# Patient Record
Sex: Male | Born: 1945 | Race: Black or African American | Hispanic: No | Marital: Married | Smoking: Never smoker
Health system: Southern US, Community
[De-identification: ages and names within clinical notes are randomized; demographics above are authoritative.]

## PROBLEM LIST (undated history)

## (undated) DIAGNOSIS — I370 Nonrheumatic pulmonary valve stenosis: Secondary | ICD-10-CM

## (undated) DIAGNOSIS — C9110 Chronic lymphocytic leukemia of B-cell type not having achieved remission: Secondary | ICD-10-CM

## (undated) DIAGNOSIS — G4730 Sleep apnea, unspecified: Secondary | ICD-10-CM

## (undated) DIAGNOSIS — M1990 Unspecified osteoarthritis, unspecified site: Secondary | ICD-10-CM

## (undated) DIAGNOSIS — N529 Male erectile dysfunction, unspecified: Secondary | ICD-10-CM

## (undated) DIAGNOSIS — J301 Allergic rhinitis due to pollen: Secondary | ICD-10-CM

## (undated) DIAGNOSIS — R55 Syncope and collapse: Secondary | ICD-10-CM

## (undated) DIAGNOSIS — Z5189 Encounter for other specified aftercare: Secondary | ICD-10-CM

## (undated) DIAGNOSIS — Z9889 Other specified postprocedural states: Secondary | ICD-10-CM

## (undated) DIAGNOSIS — E669 Obesity, unspecified: Secondary | ICD-10-CM

## (undated) DIAGNOSIS — I10 Essential (primary) hypertension: Secondary | ICD-10-CM

## (undated) DIAGNOSIS — T884XXA Failed or difficult intubation, initial encounter: Secondary | ICD-10-CM

## (undated) DIAGNOSIS — H269 Unspecified cataract: Secondary | ICD-10-CM

## (undated) DIAGNOSIS — C61 Malignant neoplasm of prostate: Secondary | ICD-10-CM

## (undated) DIAGNOSIS — F419 Anxiety disorder, unspecified: Secondary | ICD-10-CM

## (undated) DIAGNOSIS — M5030 Other cervical disc degeneration, unspecified cervical region: Secondary | ICD-10-CM

## (undated) DIAGNOSIS — T7840XA Allergy, unspecified, initial encounter: Secondary | ICD-10-CM

## (undated) DIAGNOSIS — J310 Chronic rhinitis: Secondary | ICD-10-CM

## (undated) DIAGNOSIS — R011 Cardiac murmur, unspecified: Secondary | ICD-10-CM

## (undated) DIAGNOSIS — K219 Gastro-esophageal reflux disease without esophagitis: Secondary | ICD-10-CM

## (undated) DIAGNOSIS — G4733 Obstructive sleep apnea (adult) (pediatric): Secondary | ICD-10-CM

## (undated) DIAGNOSIS — I00 Rheumatic fever without heart involvement: Secondary | ICD-10-CM

## (undated) DIAGNOSIS — IMO0001 Reserved for inherently not codable concepts without codable children: Secondary | ICD-10-CM

## (undated) DIAGNOSIS — Z531 Procedure and treatment not carried out because of patient's decision for reasons of belief and group pressure: Secondary | ICD-10-CM

## (undated) HISTORY — DX: Unspecified osteoarthritis, unspecified site: M19.90

## (undated) HISTORY — PX: PROSTATE BIOPSY: SHX241

## (undated) HISTORY — DX: Cardiac murmur, unspecified: R01.1

## (undated) HISTORY — DX: Obstructive sleep apnea (adult) (pediatric): G47.33

## (undated) HISTORY — DX: Encounter for other specified aftercare: Z51.89

## (undated) HISTORY — DX: Nonrheumatic pulmonary valve stenosis: I37.0

## (undated) HISTORY — DX: Obesity, unspecified: E66.9

## (undated) HISTORY — DX: Other cervical disc degeneration, unspecified cervical region: M50.30

## (undated) HISTORY — DX: Allergic rhinitis due to pollen: J30.1

## (undated) HISTORY — DX: Gastro-esophageal reflux disease without esophagitis: K21.9

## (undated) HISTORY — DX: Unspecified cataract: H26.9

## (undated) HISTORY — PX: COLONOSCOPY: SHX174

## (undated) HISTORY — DX: Rheumatic fever without heart involvement: I00

## (undated) HISTORY — DX: Other specified postprocedural states: Z98.890

## (undated) HISTORY — DX: Essential (primary) hypertension: I10

## (undated) HISTORY — DX: Chronic rhinitis: J31.0

## (undated) HISTORY — DX: Syncope and collapse: R55

## (undated) HISTORY — PX: ROTATOR CUFF REPAIR: SHX139

## (undated) HISTORY — DX: Male erectile dysfunction, unspecified: N52.9

## (undated) HISTORY — DX: Reserved for inherently not codable concepts without codable children: IMO0001

## (undated) HISTORY — DX: Procedure and treatment not carried out because of patient's decision for reasons of belief and group pressure: Z53.1

## (undated) HISTORY — DX: Allergy, unspecified, initial encounter: T78.40XA

---

## 1999-02-23 ENCOUNTER — Encounter: Admission: RE | Admit: 1999-02-23 | Discharge: 1999-02-23 | Payer: Self-pay | Admitting: Emergency Medicine

## 1999-04-23 ENCOUNTER — Ambulatory Visit: Admission: RE | Admit: 1999-04-23 | Discharge: 1999-04-23 | Payer: Self-pay | Admitting: Emergency Medicine

## 2000-06-09 ENCOUNTER — Ambulatory Visit (HOSPITAL_BASED_OUTPATIENT_CLINIC_OR_DEPARTMENT_OTHER): Admission: RE | Admit: 2000-06-09 | Discharge: 2000-06-09 | Payer: Self-pay | Admitting: Pulmonary Disease

## 2000-06-20 ENCOUNTER — Encounter: Admission: RE | Admit: 2000-06-20 | Discharge: 2000-06-20 | Payer: Self-pay | Admitting: Emergency Medicine

## 2000-06-20 ENCOUNTER — Encounter: Payer: Self-pay | Admitting: Emergency Medicine

## 2001-02-28 ENCOUNTER — Encounter: Payer: Self-pay | Admitting: Emergency Medicine

## 2001-02-28 ENCOUNTER — Encounter: Admission: RE | Admit: 2001-02-28 | Discharge: 2001-02-28 | Payer: Self-pay | Admitting: Emergency Medicine

## 2004-02-13 ENCOUNTER — Encounter (INDEPENDENT_AMBULATORY_CARE_PROVIDER_SITE_OTHER): Payer: Self-pay | Admitting: Specialist

## 2004-02-13 ENCOUNTER — Encounter (INDEPENDENT_AMBULATORY_CARE_PROVIDER_SITE_OTHER): Payer: Self-pay | Admitting: *Deleted

## 2004-02-13 ENCOUNTER — Ambulatory Visit (HOSPITAL_COMMUNITY): Admission: RE | Admit: 2004-02-13 | Discharge: 2004-02-13 | Payer: Self-pay | Admitting: Gastroenterology

## 2004-05-19 ENCOUNTER — Ambulatory Visit: Payer: Self-pay | Admitting: Internal Medicine

## 2004-05-24 ENCOUNTER — Ambulatory Visit: Payer: Self-pay | Admitting: Internal Medicine

## 2004-06-01 ENCOUNTER — Ambulatory Visit: Payer: Self-pay | Admitting: Internal Medicine

## 2004-06-07 ENCOUNTER — Ambulatory Visit: Payer: Self-pay | Admitting: Internal Medicine

## 2004-06-14 ENCOUNTER — Ambulatory Visit: Payer: Self-pay | Admitting: Internal Medicine

## 2004-06-15 ENCOUNTER — Ambulatory Visit: Payer: Self-pay | Admitting: Internal Medicine

## 2004-06-21 ENCOUNTER — Ambulatory Visit: Payer: Self-pay | Admitting: Internal Medicine

## 2004-06-28 ENCOUNTER — Ambulatory Visit: Payer: Self-pay | Admitting: Internal Medicine

## 2004-07-05 ENCOUNTER — Ambulatory Visit: Payer: Self-pay | Admitting: Internal Medicine

## 2004-07-12 ENCOUNTER — Ambulatory Visit: Payer: Self-pay | Admitting: Internal Medicine

## 2004-07-19 ENCOUNTER — Ambulatory Visit: Payer: Self-pay | Admitting: Internal Medicine

## 2004-07-26 ENCOUNTER — Ambulatory Visit: Payer: Self-pay | Admitting: Internal Medicine

## 2004-08-03 ENCOUNTER — Ambulatory Visit: Payer: Self-pay | Admitting: Internal Medicine

## 2004-08-09 ENCOUNTER — Ambulatory Visit: Payer: Self-pay | Admitting: Internal Medicine

## 2004-08-16 ENCOUNTER — Ambulatory Visit: Payer: Self-pay | Admitting: Internal Medicine

## 2004-08-23 ENCOUNTER — Ambulatory Visit: Payer: Self-pay | Admitting: Internal Medicine

## 2008-04-14 ENCOUNTER — Encounter: Payer: Self-pay | Admitting: Gastroenterology

## 2008-04-21 DIAGNOSIS — G4733 Obstructive sleep apnea (adult) (pediatric): Secondary | ICD-10-CM

## 2008-04-21 DIAGNOSIS — E669 Obesity, unspecified: Secondary | ICD-10-CM | POA: Insufficient documentation

## 2008-04-21 DIAGNOSIS — J310 Chronic rhinitis: Secondary | ICD-10-CM

## 2008-04-21 DIAGNOSIS — I10 Essential (primary) hypertension: Secondary | ICD-10-CM

## 2008-04-21 DIAGNOSIS — R011 Cardiac murmur, unspecified: Secondary | ICD-10-CM

## 2008-04-22 ENCOUNTER — Ambulatory Visit: Payer: Self-pay | Admitting: Gastroenterology

## 2008-04-30 ENCOUNTER — Ambulatory Visit: Payer: Self-pay | Admitting: Gastroenterology

## 2008-04-30 ENCOUNTER — Encounter: Payer: Self-pay | Admitting: Gastroenterology

## 2008-05-01 ENCOUNTER — Encounter: Payer: Self-pay | Admitting: Gastroenterology

## 2008-05-06 ENCOUNTER — Ambulatory Visit: Payer: Self-pay | Admitting: Cardiovascular Disease

## 2008-05-06 ENCOUNTER — Telehealth: Payer: Self-pay | Admitting: Gastroenterology

## 2009-11-17 ENCOUNTER — Emergency Department (HOSPITAL_COMMUNITY): Admission: EM | Admit: 2009-11-17 | Discharge: 2009-11-17 | Payer: Self-pay | Admitting: Emergency Medicine

## 2010-06-06 ENCOUNTER — Encounter: Payer: Self-pay | Admitting: Emergency Medicine

## 2010-10-01 NOTE — Op Note (Signed)
Eugene [name], Eugene [name]               ACCOUNT NO.:  [id number]   MEDICAL RECORD NO.:  [id number]          PATIENT TYPE:  AMB   LOCATION:  ENDO                         FACILITY:  [facility]   PHYSICIAN:  Graylin Shiver, M.D.   DATE OF BIRTH:  Aug 12, 1945   DATE OF PROCEDURE:  02/13/2004  DATE OF DISCHARGE:                                 OPERATIVE REPORT   PROCEDURE:  Colonoscopy with biopsy.   INDICATIONS:  Right-sided abdominal pain, etiology unclear.  Screening.   Informed consent was obtained after explanation of the risks of bleeding,  infection, and perforation.   PREMEDICATIONS:  Fentanyl 125 mcg IV, Versed 12 mg IV.   PROCEDURE:  With the patient in the left lateral decubitus position, a  rectal exam was performed.  No masses were felt.  The Olympus colonoscope  was inserted into the rectum and advanced around the colon to the cecum.  Cecal landmarks were identified.  The cecum and ascending colon looked  normal.  The descending colon and sigmoid revealed diverticulosis.  Also in  the descending colon, there was a small area on a fold that looked like some  slight thickening on the fold, and a biopsy was obtained.  I do not think  this will be of any clinical significance.  Biopsy will be checked.  The  rectum looked normal.  He tolerated the procedure well without  complications.   IMPRESSION:  Diverticulosis.   I see nothing on this examination to explain the patient's right-sided  abdominal pain.   IMPRESSION:  Normal colonoscopy to the cecum.   PLAN:  I would recommend a follow-up screening colonoscopy again in 10  years.      SFG/MEDQ  D:  02/13/2004  T:  02/13/2004  Job:  [id number]   cc:   Reuben Likes, M.D.  [address]  [place]  [place] [address]  Fax: [phone]

## 2011-03-01 ENCOUNTER — Encounter: Payer: Self-pay | Admitting: Family Medicine

## 2011-03-01 ENCOUNTER — Ambulatory Visit (INDEPENDENT_AMBULATORY_CARE_PROVIDER_SITE_OTHER): Payer: 59 | Admitting: Family Medicine

## 2011-03-01 DIAGNOSIS — J301 Allergic rhinitis due to pollen: Secondary | ICD-10-CM

## 2011-03-01 DIAGNOSIS — F411 Generalized anxiety disorder: Secondary | ICD-10-CM

## 2011-03-01 DIAGNOSIS — G4733 Obstructive sleep apnea (adult) (pediatric): Secondary | ICD-10-CM

## 2011-03-01 DIAGNOSIS — M1990 Unspecified osteoarthritis, unspecified site: Secondary | ICD-10-CM

## 2011-03-01 DIAGNOSIS — N529 Male erectile dysfunction, unspecified: Secondary | ICD-10-CM

## 2011-03-01 DIAGNOSIS — J302 Other seasonal allergic rhinitis: Secondary | ICD-10-CM | POA: Insufficient documentation

## 2011-03-01 DIAGNOSIS — I10 Essential (primary) hypertension: Secondary | ICD-10-CM

## 2011-03-01 MED ORDER — AMLODIPINE BESYLATE 10 MG PO TABS: 10.0000 mg | ORAL_TABLET | Freq: Every day | ORAL | Status: DC

## 2011-03-01 MED ORDER — LOSARTAN POTASSIUM 50 MG PO TABS
50.0000 mg | ORAL_TABLET | Freq: Two times a day (BID) | ORAL | Status: DC
Start: 2011-03-01 — End: 2011-08-30

## 2011-03-01 MED ORDER — FLUTICASONE PROPIONATE 50 MCG/ACT NA SUSP: 2.0000 | Freq: Two times a day (BID) | NASAL | Status: DC

## 2011-03-01 MED ORDER — CARVEDILOL 6.25 MG PO TABS: 6.2500 mg | ORAL_TABLET | Freq: Two times a day (BID) | ORAL | Status: DC

## 2011-03-01 MED ORDER — AZELASTINE HCL 0.1 % NA SOLN: 1.0000 | Freq: Two times a day (BID) | NASAL | Status: DC

## 2011-03-01 MED ORDER — VIAGRA 100 MG PO TABS: 100.0000 mg | ORAL_TABLET | ORAL | Status: DC | PRN

## 2011-03-01 MED ORDER — CLONAZEPAM 1 MG PO TABS: 1.0000 mg | ORAL_TABLET | Freq: Every day | ORAL | Status: DC

## 2011-03-01 NOTE — Progress Notes (Signed)
  Subjective:    Patient ID: Eugene [name], male    DOB: [birth year], 65 y.o.   MRN: [id number]  HPIHoward is a 65 year old, married male, nonsmoker, who comes in today as a new patient.  He went to see Dr. Susann [name] after it his insurance would no longer covered Dr. Renae [name], and they refused to see him.  Because he has Medicare.  He has a history of underlying hypertension, for which he takes losartan 50 mg b.i.d., Norvasc, 10 mg daily, and carvedilol 6.25 mg b.i.d.Marland Kitchen  Blood pressure today 150/90.  He, states that's normal for him.  I explained that this is not normal.  Blood pressure.  He also has allergic rhinitis refuses steroid nasal spray, and an over-the-counter antihistamine, and some Astelin nasal spray.  He also has mild anxiety, for which he takes clonazepam 1 mg p.r.n.  He also has erectile dysfunction for which he uses Viagra 100 mg p.r.n.  Illnesses osteoarthritis, for which he takes nabumetone 750 mg b.i.d., and volt gel  Less physical exam by Dr. Renae [name] in January 2012 normal.  He does not recall when his last vaccination for the past year.  He copy of all his medical records.  He also complains of fatigue.  He states he had a sleep apnea evaluation by Dr. Mellody [name] in 2004 and was diagnosed with sleep apnea.  However, he stopped the device because he couldn't tolerate it.  I asked him to go back and see Dr. Mellody [name] to discuss the new options.    Review of Systems    General cardiovascular review of systems otherwise negative Objective:   Physical Exam  Well-developed well-nourished, male in no acute distress, slightly overweight, 257 pounds.  BP right arm sitting position 140/90.        Assessment & Plan:  Hypertension not at goal.  Plan daily blood pressure checks.  Follow-up in 3 weeks.  Allergic rhinitis.  Continue the steroid nasal spray Astelin nasal spray, and over-the-counter Claritin plain.  Osteoarthritis.  Motrin, 600 mg b.i.d.  History of sleep apnea.  We  consult with Dr. Mellody [name].  Erectile dysfunction continue Viagra.  Return in 3 weeks for follow-up of your blood pressure

## 2011-03-01 NOTE — Patient Instructions (Signed)
f y  blood pressure continue the losartan 50 mg twice daily, carvedilol 6.25 mg twice daily, and the Norvasc, 10 mg once daily........ Check your blood pressure daily in the morning and return in 3 weeks with the data and the device  Stop the gel, and nabutone ,,,,,,,,,,, Motrin 600 mg twice daily with food.  Vicryl 100 mg p.r.n.  Call Dr. Mellody Dance to reconsult because of the sleep apnea.

## 2011-03-22 ENCOUNTER — Encounter: Payer: Self-pay | Admitting: Family Medicine

## 2011-03-22 ENCOUNTER — Ambulatory Visit (INDEPENDENT_AMBULATORY_CARE_PROVIDER_SITE_OTHER): Payer: 59 | Admitting: Family Medicine

## 2011-03-22 DIAGNOSIS — I10 Essential (primary) hypertension: Secondary | ICD-10-CM

## 2011-03-22 DIAGNOSIS — J301 Allergic rhinitis due to pollen: Secondary | ICD-10-CM

## 2011-03-22 DIAGNOSIS — N529 Male erectile dysfunction, unspecified: Secondary | ICD-10-CM | POA: Insufficient documentation

## 2011-03-22 LAB — CBC WITH DIFFERENTIAL/PLATELET
Basophils Absolute: 0 10*3/uL (ref 0.0–0.1)
Basophils Relative: 0.6 % (ref 0.0–3.0)
Eosinophils Absolute: 0.2 10*3/uL (ref 0.0–0.7)
MCHC: 33.1 g/dL (ref 30.0–36.0)
MCV: 101.8 fl — ABNORMAL HIGH (ref 78.0–100.0)
Monocytes Absolute: 0.4 10*3/uL (ref 0.1–1.0)
Neutro Abs: 3.3 10*3/uL (ref 1.4–7.7)
Neutrophils Relative %: 55.2 % (ref 43.0–77.0)
RBC: 4.16 Mil/uL — ABNORMAL LOW (ref 4.22–5.81)
RDW: 12.6 % (ref 11.5–14.6)

## 2011-03-22 LAB — BASIC METABOLIC PANEL
BUN: 14 mg/dL (ref 6–23)
CO2: 31 mEq/L (ref 19–32)
Calcium: 9.5 mg/dL (ref 8.4–10.5)
Glucose, Bld: 77 mg/dL (ref 70–99)
Potassium: 4.5 mEq/L (ref 3.5–5.1)
Sodium: 141 mEq/L (ref 135–145)

## 2011-03-22 NOTE — Patient Instructions (Signed)
Continue your current blood pressure medication.  Check your blood pressure weekly, right arm in the morning.  We will call you when we get her lab work back.  Call Dr. Dillard Cannon ................ nose and throat specialist for consultation on the right-sided facial pain

## 2011-03-22 NOTE — Progress Notes (Signed)
  Subjective:    Patient ID: Eugene [name], male    DOB: [birth year], 65 y.o.   MRN: [id number]  HPI Eugene [name] is a 65 year old male, nonsmoker, who comes in today for evaluation.  Three problems.  He is currently on Norvasc 10 mg daily, carvedilol 6.25 mg b.i.d., to start an 50 mg b.i.d., BP stable.  130/80 on average.  He states he has allergic rhinitis and now has chronic sinus pain and he points to his right frontal and maxillary sinuses is chronic pain.  He tried over-the-counter medications, nasal sprays to no avail.  He states the right side of his face.  His been stopped up for 6 months.  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  He took one month of medication and then took no more because the physician that he saw who prescribed the medication did not renew it.???????????  His symptoms are fatigue, no energy, and ED for which he takes Viagra 100 mg p.r.n.     Review of Systems    General cardiovascular, ENT, and sexual review of systems otherwise negative Objective:   Physical Exam  Well-developed well-nourished man no acute distress.  BP ram sitting position 130/90      Assessment & Plan:  Hypertension adult continue current therapy.  BP checks weekly at home.  History of hypo-gon........Marland Kitchen Reevaluate with testosterone level, and prolactin level.  Chronic allergic rhinitis, now with right maxillary and frontal pain, x 6 months referred to ENT.  Dr. Ezzard Standing

## 2011-03-23 ENCOUNTER — Telehealth: Payer: Self-pay | Admitting: Family Medicine

## 2011-03-23 LAB — PSA, TOTAL AND FREE: PSA: 1.52 ng/mL (ref <=4.00)

## 2011-03-23 LAB — PROLACTIN: Prolactin: 5.5 ng/mL (ref 2.1–17.1)

## 2011-03-23 NOTE — Telephone Encounter (Signed)
Fleet Contras [facility] denied the nasal spray on this pt. You said he wouldn't mind switching if denied, so this is just  FYI. Thanks!

## 2011-03-24 MED ORDER — MOMETASONE FUROATE 50 MCG/ACT NA SUSP: 2.0000 | Freq: Every day | NASAL | Status: DC

## 2011-03-24 NOTE — Telephone Encounter (Signed)
New rx sent

## 2011-03-28 ENCOUNTER — Ambulatory Visit (INDEPENDENT_AMBULATORY_CARE_PROVIDER_SITE_OTHER): Payer: 59 | Admitting: Family Medicine

## 2011-03-28 ENCOUNTER — Encounter: Payer: Self-pay | Admitting: Family Medicine

## 2011-03-28 DIAGNOSIS — N529 Male erectile dysfunction, unspecified: Secondary | ICD-10-CM

## 2011-03-28 NOTE — Patient Instructions (Signed)
Take the Viagra 100 mg........... One half to one tablet as needed.  Consider the pluses and minuses of supplementation and let us know which way he like to go

## 2011-03-28 NOTE — Progress Notes (Signed)
  Subjective:    Patient ID: Eugene [name], male    DOB: 05/10/1946, 65 y.o.   MRN: [id number]  HPI [name] is a 65 year old male, who comes in today for evaluation of low testosterone.  As previously noted he is seeing another physician and was diagnosed with low testosterone supplements for two months and then stopped.  He does have symptoms of erectile dysfunction.  Otherwise asymptomatic.  Lab shows a normal prolactin level and a testosterone level of 331.  Low normal being 350.  We discussed all the pluses and minuses of supplementation, including the increased risk of cancer, with costosternal supple.  Medications.  I advised him to go home and think about it read about it and let us know what he would like to do in the meantime.  A prescription will be given for Viagra   Review of Systems    In general, and genitourinary review of systems otherwise negative Objective:   Physical Exam Well-developed and nourished, male in no acute distress       Assessment & Plan:  Load testosterone plan the Viagra supplementation.  Patient to consider testosterone supplements, including all the potential complications

## 2011-03-30 ENCOUNTER — Other Ambulatory Visit: Payer: Self-pay | Admitting: Otolaryngology

## 2011-03-30 DIAGNOSIS — J329 Chronic sinusitis, unspecified: Secondary | ICD-10-CM

## 2011-04-01 ENCOUNTER — Ambulatory Visit
Admission: RE | Admit: 2011-04-01 | Discharge: 2011-04-01 | Disposition: A | Discharge status: Home / Self Care | Payer: 59 | Source: Ambulatory Visit | Attending: Otolaryngology | Admitting: Otolaryngology

## 2011-04-01 DIAGNOSIS — J329 Chronic sinusitis, unspecified: Secondary | ICD-10-CM

## 2011-04-05 ENCOUNTER — Other Ambulatory Visit: Payer: Self-pay | Admitting: *Deleted

## 2011-04-05 DIAGNOSIS — N529 Male erectile dysfunction, unspecified: Secondary | ICD-10-CM

## 2011-04-05 MED ORDER — VIAGRA 100 MG PO TABS: 100.0000 mg | ORAL_TABLET | ORAL | Status: DC | PRN

## 2011-04-06 ENCOUNTER — Other Ambulatory Visit: Payer: Self-pay | Admitting: *Deleted

## 2011-04-06 DIAGNOSIS — J301 Allergic rhinitis due to pollen: Secondary | ICD-10-CM

## 2011-04-06 MED ORDER — FLUTICASONE PROPIONATE 50 MCG/ACT NA SUSP: 2.0000 | Freq: Two times a day (BID) | NASAL | Status: DC

## 2011-04-06 NOTE — Telephone Encounter (Signed)
Patient would like the rx for Flonase resent with 3 bottles

## 2011-08-02 ENCOUNTER — Ambulatory Visit (HOSPITAL_COMMUNITY)
Admission: RE | Admit: 2011-08-02 | Discharge: 2011-08-02 | Disposition: A | Discharge status: Home / Self Care | Payer: Medicare PPO | Source: Ambulatory Visit | Attending: Cardiology | Admitting: Cardiology

## 2011-08-02 ENCOUNTER — Encounter (HOSPITAL_COMMUNITY): Payer: Self-pay | Admitting: *Deleted

## 2011-08-02 ENCOUNTER — Encounter (HOSPITAL_COMMUNITY)
Admission: RE | Disposition: A | Discharge status: Home / Self Care | Payer: Self-pay | Source: Ambulatory Visit | Attending: Cardiology

## 2011-08-02 DIAGNOSIS — Q211 Atrial septal defect: Secondary | ICD-10-CM | POA: Insufficient documentation

## 2011-08-02 DIAGNOSIS — I379 Nonrheumatic pulmonary valve disorder, unspecified: Secondary | ICD-10-CM | POA: Insufficient documentation

## 2011-08-02 DIAGNOSIS — Q2111 Secundum atrial septal defect: Secondary | ICD-10-CM | POA: Insufficient documentation

## 2011-08-02 HISTORY — DX: Sleep apnea, unspecified: G47.30

## 2011-08-02 HISTORY — PX: TEE WITHOUT CARDIOVERSION: SHX5443

## 2011-08-02 HISTORY — DX: Anxiety disorder, unspecified: F41.9

## 2011-08-02 SURGERY — ECHOCARDIOGRAM, TRANSESOPHAGEAL
Anesthesia: Moderate Sedation

## 2011-08-02 MED ORDER — DIPHENHYDRAMINE HCL 50 MG/ML IJ SOLN
INTRAMUSCULAR | Status: AC
  Filled 2011-08-02: qty 1

## 2011-08-02 MED ORDER — MIDAZOLAM HCL 10 MG/2ML IJ SOLN
INTRAMUSCULAR | Status: AC
  Filled 2011-08-02: qty 4

## 2011-08-02 MED ORDER — FENTANYL CITRATE 0.05 MG/ML IJ SOLN
INTRAMUSCULAR | Status: DC | PRN
  Administered 2011-08-02 (×3): 50 ug via INTRAVENOUS

## 2011-08-02 MED ORDER — LABETALOL HCL 5 MG/ML IV SOLN
INTRAVENOUS | Status: DC | PRN
  Administered 2011-08-02: 20 mg via INTRAVENOUS

## 2011-08-02 MED ORDER — SODIUM CHLORIDE 0.45 % IV SOLN
Freq: Once | INTRAVENOUS | Status: AC
  Administered 2011-08-02: 11:00:00 via INTRAVENOUS

## 2011-08-02 MED ORDER — LABETALOL HCL 5 MG/ML IV SOLN
INTRAVENOUS | Status: AC
  Filled 2011-08-02: qty 4

## 2011-08-02 MED ORDER — FENTANYL CITRATE 0.05 MG/ML IJ SOLN
INTRAMUSCULAR | Status: AC
  Filled 2011-08-02: qty 4

## 2011-08-02 MED ORDER — BUTAMBEN-TETRACAINE-BENZOCAINE 2-2-14 % EX AERO
INHALATION_SPRAY | CUTANEOUS | Status: DC | PRN
  Administered 2011-08-02: 2 via TOPICAL

## 2011-08-02 MED ORDER — MIDAZOLAM HCL 10 MG/2ML IJ SOLN
INTRAMUSCULAR | Status: DC | PRN
  Administered 2011-08-02: 3 mg via INTRAVENOUS
  Administered 2011-08-02: 1 mg via INTRAVENOUS
  Administered 2011-08-02: 2 mg via INTRAVENOUS

## 2011-08-02 NOTE — CV Procedure (Signed)
TEE performed. Intubation helped by Dr. Nadine Counts Bucchini (Thank you)  Small PFO with mildly positive right to left shunting with Valsalva.  Mild pulmonary valve stenosis. Mild MR. Please see PACS for full report.

## 2011-08-02 NOTE — H&P (Addendum)
  Please see paper chart. Patient examined and medical history reviewed. No change from 07/30/11 OV note

## 2011-08-03 ENCOUNTER — Encounter (HOSPITAL_COMMUNITY): Payer: Self-pay | Admitting: Cardiology

## 2011-08-17 ENCOUNTER — Encounter: Payer: Self-pay | Admitting: Pulmonary Disease

## 2011-08-17 ENCOUNTER — Ambulatory Visit (INDEPENDENT_AMBULATORY_CARE_PROVIDER_SITE_OTHER): Payer: 59 | Admitting: Pulmonary Disease

## 2011-08-17 VITALS — BP 152/78 | HR 96 | Temp 98.7°F | Ht 69.5 in | Wt 254.2 lb

## 2011-08-17 DIAGNOSIS — G4733 Obstructive sleep apnea (adult) (pediatric): Secondary | ICD-10-CM

## 2011-08-17 NOTE — Progress Notes (Signed)
  Subjective:    Patient ID: Eugene [name], male    DOB: April 04, 1946, 66 y.o.   MRN: [id number]  HPI The patient is a 66 year old male who I've been asked to see for obstructive sleep apnea.  He was diagnosed with.  Severe sleep apnea in 2000, with an AHI of 76 events and per hour.  He was started on CPAP, and titrated to a therapeutic pressure of 11 cm.  He used this until 2005, when he discontinued because of a feeling of "suffocation".  He has not used CPAP since.  The patient continues to have loud snoring, but states that his wife has not commented lately about an abnormal breathing pattern during sleep.  He notes frequent awakenings at night, and is unrested at least 50% the mornings upon arising.  The patient denies sleepiness during the day except after lunch, when he notes significant symptoms with inactivity.  He denies sleepiness issues in the evening watching television or movies, and denies sleepiness while driving.  He states his weight is down 12 pounds over the last 2 years, and his Epworth score today is 8  10:00 to 10:30 How long does it take you to fall asleep? 30 to 45 mins How many times during the night do you wake up? 4 What time do you get out of bed to start your day? 0630 Do you drive or operate heavy machinery in your occupation? No How much has your weight changed (up or down) over the past two years? (In pounds) 12 lb (5.443 kg) Have you ever had a sleep study before? Yes If yes, location of study? [facility] If yes, date of study? 2002 Do you currently use CPAP? No Do you wear oxygen at any time? No    Review of Systems  Constitutional: Negative for fever and unexpected weight change.  HENT: Positive for congestion. Negative for ear pain, nosebleeds, sore throat, rhinorrhea, sneezing, trouble swallowing, dental problem, postnasal drip and sinus pressure.   Eyes: Negative for redness and itching.  Respiratory: Negative for cough, chest tightness, shortness of breath  and wheezing.   Cardiovascular: Negative for palpitations and leg swelling.  Gastrointestinal: Negative for nausea and vomiting.  Genitourinary: Negative for dysuria.  Musculoskeletal: Positive for joint swelling.  Skin: Negative for rash.  Neurological: Negative for headaches.  Hematological: Does not bruise/bleed easily.  Psychiatric/Behavioral: Negative for dysphoric mood. The patient is not nervous/anxious.        Objective:   Physical Exam Constitutional:  Obese male, no acute distress  HENT:  Nares patent without discharge, but enlarged turbinates  Oropharynx without exudate, palate and uvula are mildly elongated  Eyes:  Perrla, eomi, no scleral icterus  Neck:  No JVD, no TMG  Cardiovascular:  Normal rate, regular rhythm, no rubs or gallops.  3/6 rumbling murmur        Intact distal pulses  Pulmonary :  Normal breath sounds, no stridor or respiratory distress   No rales, rhonchi, or wheezing  Abdominal:  Soft, nondistended, bowel sounds present.  No tenderness noted.   Musculoskeletal:  + lower extremity edema noted.  Lymph Nodes:  No cervical lymphadenopathy noted  Skin:  No cyanosis noted  Neurologic:  Alert, appropriate, moves all 4 extremities without obvious deficit.         Assessment & Plan:

## 2011-08-17 NOTE — Patient Instructions (Signed)
Will start you on cpap with pressure that is self adjusting to your degree of sleep apnea throughout the night.  Please call us if having issues with tolerance. Work on weight loss If doing well, would like to see you back in 5 weeks.

## 2011-08-17 NOTE — Assessment & Plan Note (Signed)
The patient has a history of severe obstructive sleep apnea, but has not used CPAP since 2005.  He has not lost significant weight, and therefore still has significant sleep apnea.  He still has loud snoring, frequent awakenings at night, and nonrestorative sleep.  I am also concerned about its impact to his long-term cardiovascular health.  I have had a long discussion with him about the treatment for her severe sleep apnea, and feel that CPAP and weight loss are his best options.  I have reviewed the various new CPAP devices, including the different ways that pressure can be delivered.  The patient is willing to try CPAP again, and will start him out on the automatic mode.  I have also stressed to him again the importance of aggressive weight loss.

## 2011-08-25 ENCOUNTER — Telehealth: Payer: Self-pay | Admitting: Pulmonary Disease

## 2011-08-25 NOTE — Telephone Encounter (Signed)
Will forward to [facility] as FYI 

## 2011-08-25 NOTE — Telephone Encounter (Signed)
Pt already has appt with [facility] 5.16.13 which is exactly 5 weeks from now.  Will forward back to [facility] to make him aware.

## 2011-08-25 NOTE — Telephone Encounter (Signed)
If he just got his cpap, then his f/u visit will need to be adjusted to 5 weeks from now.

## 2011-08-26 ENCOUNTER — Ambulatory Visit: Payer: 59 | Admitting: Internal Medicine

## 2011-08-30 ENCOUNTER — Encounter: Payer: Self-pay | Admitting: Internal Medicine

## 2011-08-30 ENCOUNTER — Ambulatory Visit (INDEPENDENT_AMBULATORY_CARE_PROVIDER_SITE_OTHER): Payer: 59 | Admitting: Internal Medicine

## 2011-08-30 VITALS — BP 124/78 | HR 75 | Temp 98.6°F | Ht 69.5 in | Wt 252.2 lb

## 2011-08-30 DIAGNOSIS — I379 Nonrheumatic pulmonary valve disorder, unspecified: Secondary | ICD-10-CM

## 2011-08-30 DIAGNOSIS — M1990 Unspecified osteoarthritis, unspecified site: Secondary | ICD-10-CM

## 2011-08-30 DIAGNOSIS — J301 Allergic rhinitis due to pollen: Secondary | ICD-10-CM

## 2011-08-30 DIAGNOSIS — I370 Nonrheumatic pulmonary valve stenosis: Secondary | ICD-10-CM | POA: Insufficient documentation

## 2011-08-30 DIAGNOSIS — I10 Essential (primary) hypertension: Secondary | ICD-10-CM

## 2011-08-30 MED ORDER — AZELASTINE HCL 0.1 % NA SOLN: 1.0000 | Freq: Two times a day (BID) | NASAL | Status: DC

## 2011-08-30 MED ORDER — LOSARTAN POTASSIUM 50 MG PO TABS: 50.0000 mg | ORAL_TABLET | Freq: Two times a day (BID) | ORAL | Status: DC

## 2011-08-30 MED ORDER — DICLOFENAC SODIUM 1 % TD GEL: 1.0000 "application " | Freq: Four times a day (QID) | TRANSDERMAL | Status: DC

## 2011-08-30 MED ORDER — CARVEDILOL 6.25 MG PO TABS: 6.2500 mg | ORAL_TABLET | Freq: Two times a day (BID) | ORAL | Status: DC

## 2011-08-30 MED ORDER — AMLODIPINE BESYLATE 10 MG PO TABS: 10.0000 mg | ORAL_TABLET | Freq: Every day | ORAL | Status: DC

## 2011-08-30 NOTE — Assessment & Plan Note (Signed)
chronic sinus and allergy symptoms  - reviewed CT 03/2011 Has seen ENT for same, upcoming allergist visit 09/2011 No astelin at this time due to problems with PA - will refill now Continue nasal steroid

## 2011-08-30 NOTE — Progress Notes (Signed)
Subjective:    Patient ID: Eugene [name], male    DOB: Apr 29, 1946, 66 y.o.   MRN: [id number]  HPI  New pt to me but known to our practice - transfer from [facility] office ([facility]) Here to establish care  Reviewed chronic medical issues:  allergic rhinitis - chronic but seasonally worse - has seen ENT with 03/2011 CT - planning visit with allergist next month - ?need both nose sprays - only using flonase due to PA problems with astelin  hypertension - the patient reports compliance with medication(s) as prescribed. Denies adverse side effects.  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 follows with cardiology for same every 6-12 months. No edema, shortness of breath or other symptoms. Last TEE 07/2011 stable  OSA. hx severe disease when diagnosed in 2000. On CPAP 2000- 2005, resumed auto titration CPAP March 2013 at direction of pulmonary - reports continued problems with mask and sleep  osteoarthritis - knees - also shoulders - uses NSAIDs for same - topical and oral - recent flare of R shoulder symptoms following overuse last weekend - no swelling - no limited ROM, no injury/trauma - no weakness or numbness  Past Medical History  Diagnosis Date  . Chicken pox   . Hypertension   . Rheumatic fever   . Anxiety   . Allergic rhinitis due to pollen   . ED (erectile dysfunction)   . OBESITY   . Osteoarthritis   . RHINITIS, CHRONIC   . SLEEP APNEA, OBSTRUCTIVE dx 2000    NPSG 2000:  AHI 76/hr CPAP titrated to 11cm 2002      Review of Systems  Constitutional: Negative for fever, fatigue and unexpected weight change.  Respiratory: Negative for cough and shortness of breath.   Cardiovascular: Negative for chest pain, palpitations and leg swelling.  Gastrointestinal: Negative for abdominal pain and blood in stool.       Objective:   Physical Exam BP 124/78  Pulse 75  Temp(Src) 98.6 F (37 C) (Oral)  Ht 5' 9.5" (1.765 m)  Wt 252 lb 3.2 oz  (114.397 kg)  BMI 36.71 kg/m2  SpO2 98% Wt Readings from Last 3 Encounters:  08/30/11 252 lb 3.2 oz (114.397 kg)  08/17/11 254 lb 3.2 oz (115.304 kg)  03/22/11 253 lb (114.76 kg)   Constitutional:  He appears well-developed and well-nourished. No distress.  Neck: Normal range of motion. Neck supple. No JVD present. No thyromegaly present.  Cardiovascular: Normal rate, regular rhythm and normal heart sounds.  No murmur heard. no BLE edema Pulmonary/Chest: Effort normal and breath sounds normal. No respiratory distress. no wheezes.  Abdominal: Soft. Bowel sounds are normal. Patient exhibits no distension. There is no tenderness.  Musculoskeletal: Right Shoulder: Full range of motion. Neurovascularly intact distally. Good strength with stress of rotator cuff but causes pain. Positive impingement signs. Neurological: he is alert and oriented to person, place, and time. No cranial nerve deficit. Coordination normal.  Skin: Skin is warm and dry.  No erythema or ulceration.  Psychiatric: he has a normal mood and affect. behavior is normal. Judgment and thought content normal.   Lab Results  Component Value Date   WBC 6.0 03/22/2011   HGB 14.0 03/22/2011   HCT 42.4 03/22/2011   PLT 154.0 03/22/2011   GLUCOSE 77 03/22/2011   NA 141 03/22/2011   K 4.5 03/22/2011   CL 104 03/22/2011   CREATININE 1.1 03/22/2011   BUN 14 03/22/2011   CO2 31 03/22/2011  PSA 1.38 03/22/2011   PSA 1.52 03/22/2011      Assessment & Plan:  See problem list. Medications and labs reviewed today.

## 2011-08-30 NOTE — Patient Instructions (Signed)
It was good to see you today. We have reviewed your prior records including labs and tests today Medications reviewed, no changes at this time. Refill on medication(s) as discussed today. BP medications to mail order and Voltaren gel + nose spray to local pharamacy Continue to work with your other medical specialists as ongoing Please schedule followup in 3-4 months for continued review, call sooner if problems.

## 2011-08-30 NOTE — Assessment & Plan Note (Signed)
Regular NSAIDs - B knees and hands Recent R shoulder tendonitis - Continue NSAIDs but advised to not use topical voltaren at same time as oral Aleve

## 2011-08-30 NOTE — Assessment & Plan Note (Signed)
BP Readings from Last 3 Encounters:  08/30/11 124/78  08/17/11 152/78  08/02/11 147/97   The current medical regimen is effective;  continue present plan and medications.

## 2011-09-13 ENCOUNTER — Other Ambulatory Visit: Payer: Self-pay | Admitting: *Deleted

## 2011-09-13 DIAGNOSIS — F411 Generalized anxiety disorder: Secondary | ICD-10-CM

## 2011-09-13 NOTE — Telephone Encounter (Signed)
Left msg on vm needing a renewal on clonazepam. ... 09/13/11@4 :34pm/LMB

## 2011-09-14 MED ORDER — CLONAZEPAM 1 MG PO TABS: 1.0000 mg | ORAL_TABLET | Freq: Every day | ORAL | Status: DC

## 2011-09-14 NOTE — Telephone Encounter (Signed)
Notified pt rx fax back to [facility]... 09/14/11@1 :16pm/LMB

## 2011-09-15 ENCOUNTER — Encounter: Payer: Self-pay | Admitting: Internal Medicine

## 2011-09-15 ENCOUNTER — Ambulatory Visit (INDEPENDENT_AMBULATORY_CARE_PROVIDER_SITE_OTHER): Payer: 59 | Admitting: Internal Medicine

## 2011-09-15 VITALS — BP 142/78 | HR 83 | Ht 69.5 in | Wt 254.8 lb

## 2011-09-15 DIAGNOSIS — J301 Allergic rhinitis due to pollen: Secondary | ICD-10-CM

## 2011-09-15 MED ORDER — METHYLPREDNISOLONE ACETATE 80 MG/ML IJ SUSP
80.0000 mg | Freq: Once | INTRAMUSCULAR | Status: AC
  Administered 2011-09-15: 80 mg via INTRAMUSCULAR

## 2011-09-15 MED ORDER — PHENYLEPHRINE HCL 1 % NA SOLN
1.0000 [drp] | Freq: Once | NASAL | Status: AC
  Administered 2011-09-15: 1 [drp] via NASAL

## 2011-09-15 NOTE — Patient Instructions (Signed)
Neb neo nasal  Depo 80  Sample Dymista nasal spray    2 sprays each nostril once every day at bedtime   Use this instead of the fluticasone and astelin nasal sprays you had been using.

## 2011-09-15 NOTE — Progress Notes (Deleted)
  Subjective:    Patient ID: Eugene [name], male    DOB: 01/04/1946, 65 y.o.   MRN: [id number]  HPI The patient is a 65-year-old male who I've been asked to see for obstructive sleep apnea.  He was diagnosed with.  Severe sleep apnea in 2000, with an AHI of 76 events and per hour.  He was started on CPAP, and titrated to a therapeutic pressure of 11 cm.  He used this until 2005, when he discontinued because of a feeling of "suffocation".  He has not used CPAP since.  The patient continues to have loud snoring, but states that his wife has not commented lately about an abnormal breathing pattern during sleep.  He notes frequent awakenings at night, and is unrested at least 50% the mornings upon arising.  The patient denies sleepiness during the day except after lunch, when he notes significant symptoms with inactivity.  He denies sleepiness issues in the evening watching television or movies, and denies sleepiness while driving.  He states his weight is down 12 pounds over the last 2 years, and his Epworth score today is 8  10:00 to 10:30 How long does it take you to fall asleep? 30 to 45 mins How many times during the night do you wake up? 4 What time do you get out of bed to start your day? 0630 Do you drive or operate heavy machinery in your occupation? No How much has your weight changed (up or down) over the past two years? (In pounds) 12 lb (5.443 kg) Have you ever had a sleep study before? Yes If yes, location of study? [facility] [place] If yes, date of study? 2002 Do you currently use CPAP? No Do you wear oxygen at any time? No    Review of Systems  Constitutional: Negative for fever and unexpected weight change.  HENT: Positive for congestion. Negative for ear pain, nosebleeds, sore throat, rhinorrhea, sneezing, trouble swallowing, dental problem, postnasal drip and sinus pressure.   Eyes: Negative for redness and itching.  Respiratory: Negative for cough, chest tightness, shortness of breath  and wheezing.   Cardiovascular: Negative for palpitations and leg swelling.  Gastrointestinal: Negative for nausea and vomiting.  Genitourinary: Negative for dysuria.  Musculoskeletal: Positive for joint swelling.  Skin: Negative for rash.  Neurological: Negative for headaches.  Hematological: Does not bruise/bleed easily.  Psychiatric/Behavioral: Negative for dysphoric mood. The patient is not nervous/anxious.        Objective:   Physical Exam Constitutional:  Obese male, no acute distress  HENT:  Nares patent without discharge, but enlarged turbinates  Oropharynx without exudate, palate and uvula are mildly elongated  Eyes:  Perrla, eomi, no scleral icterus  Neck:  No JVD, no TMG  Cardiovascular:  Normal rate, regular rhythm, no rubs or gallops.  3/6 rumbling murmur        Intact distal pulses  Pulmonary :  Normal breath sounds, no stridor or respiratory distress   No rales, rhonchi, or wheezing  Abdominal:  Soft, nondistended, bowel sounds present.  No tenderness noted.   Musculoskeletal:  + lower extremity edema noted.  Lymph Nodes:  No cervical lymphadenopathy noted  Skin:  No cyanosis noted  Neurologic:  Alert, appropriate, moves all 4 extremities without obvious deficit.         Assessment & Plan:   

## 2011-09-15 NOTE — Progress Notes (Deleted)
09/15/11-  65 yoM never smoker self referred for allergy evaluation. Followed here by Dr Shelle Iron for OSA on CPAP.

## 2011-09-18 ENCOUNTER — Encounter: Payer: Self-pay | Admitting: Internal Medicine

## 2011-09-18 NOTE — Assessment & Plan Note (Signed)
Past history of allergic rhinitis. Old records are being sought. Present complaint of shifting nasal congestion might respond to decongestant therapy. The complaint of right facial numbness would be more consistent with a trigeminal or facial nerve neuropathy. Plan-nasal decongestant nebulizer treatment, Depo-Medrol, sample Dymista nasal spray.

## 2011-09-18 NOTE — Progress Notes (Signed)
09/15/11- 65 yoM never smoker self referred for allergy evaluation. Former patient at [facility]. Now followed here by Dr Shelle Iron for aA. OSA. Old records being obtained. He complains that for 4 or 5 months he has had numbness in the right side of his face "like at dentist". Right eye burns and walkers. The vision is okay and hearing okay. No discomfort with his teeth. This sensation has been persistent without change. Dr Thayer Ohm Newman/ ENT did CT scan of sinuses on 04/01/2011 describing mild mucosal thickening with no air-fluid levels. 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 No mass. He says nasal congestion is such that he has to mouth breathe but congestion shifts back and forth. He denies ENT trauma or surgery. Has been taking Astelin and fluticasone nasal sprays with little benefit. 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  Prior to Admission medications   Medication Sig Start Date End Date Taking? Authorizing Provider  ALPHA LIPOIC ACID PO Take 2 capsules by mouth daily.    Yes Historical Provider, MD  amLODipine (NORVASC) 10 MG tablet Take 1 tablet (10 mg total) by mouth daily. 08/30/11  Yes Newt Lukes, MD  azelastine (ASTELIN) 137 MCG/SPRAY nasal spray Place 1 spray into the nose 2 (two) times daily. Use in each nostril as directed 08/30/11  Yes Newt Lukes, MD  carvedilol (COREG) 6.25 MG tablet Take 1 tablet (6.25 mg total) by mouth 2 (two) times daily with a meal. 08/30/11  Yes Newt Lukes, MD  clonazePAM (KLONOPIN) 1 MG tablet Take 1 tablet (1 mg total) by mouth daily. 09/13/11  Yes Newt Lukes, MD  diclofenac sodium (VOLTAREN) 1 % GEL Apply 1 application topically 4 (four) times daily. 08/30/11  Yes Newt Lukes, MD  fluticasone (FLONASE) 50 MCG/ACT nasal spray Place 1 spray into the nose 2 (two) times daily. 04/06/11  Yes Roderick Pee, MD  GREEN COFFEE BEAN PO Take 1 capsule by mouth daily.    Yes Historical Provider, MD  losartan (COZAAR) 50 MG tablet Take 1 tablet (50 mg total) by mouth 2 (two) times daily. 08/30/11  Yes Newt Lukes, MD  Misc Natural Products (OSTEO BI-FLEX ADV DOUBLE ST PO) Take 2 capsules by mouth daily.   Yes Historical Provider, MD  Multiple Vitamins-Minerals (MULTIVITAMIN WITH MINERALS) tablet Take 1 tablet by mouth daily.     Yes Historical Provider, MD  Naproxen Sodium (ALEVE) 220 MG CAPS Take 2 capsules by mouth 2 (two) times daily.   Yes Historical Provider, MD  OVER THE COUNTER MEDICATION Curamin-for pain   Yes Historical Provider, MD  OVER THE COUNTER MEDICATION T Male-testoaterone,boost   Yes Historical Provider, MD  VIAGRA 100 MG tablet Take 1 tablet (100 mg total) by mouth as needed for erectile dysfunction. 04/05/11 04/04/12 Yes Roderick Pee, MD   Past Medical History  Diagnosis Date  . Hypertension   . Rheumatic fever     residual pulm stenosis, follows with ganji every 63mo  . Anxiety   . Allergic rhinitis due to pollen   . ED (erectile dysfunction)   . OBESITY   . Osteoarthritis   . RHINITIS, CHRONIC   . SLEEP APNEA, OBSTRUCTIVE dx 2000    NPSG 2000:  AHI 76/hr CPAP titrated to 11cm 2002   . Pulmonary stenosis, valvar     TEE 07/2011: stable, LVEF 55%, small PFO  . Refusal of blood transfusions as patient is Jehovah's Witness  Past Surgical History  Procedure Date  . Tee without cardioversion 08/02/2011    Procedure: TRANSESOPHAGEAL ECHOCARDIOGRAM (TEE);  Surgeon: Pamella Pert, MD;  Location: [facility] ENDOSCOPY;  Service: Cardiovascular;  Laterality: N/A;   Family History  Problem Relation Age of Onset  . Heart disease Mother   . Heart disease Father   . Hypertension Other    History   Social History  . Marital Status: Married    Spouse Name: N/A    Number of Children: Y  . Years of Education: N/A   Occupational History  . retired.       prev worked as Teaching laboratory technician for [employer]   Social History Main  Topics  . Smoking status: Never Smoker   . Smokeless tobacco: Not on file  . Alcohol Use: 0.0 oz/week    0 Glasses of wine per week  . Drug Use: No  . Sexually Active: Yes   Other Topics Concern  . Not on file   Social History Narrative  . No narrative on file   ROS-see HPI Constitutional:   No-   weight loss, night sweats, fevers, chills, fatigue, lassitude. HEENT:   No-  headaches, difficulty swallowing, tooth/dental problems, sore throat,       +  sneezing, itching, ear ache, nasal congestion, post nasal drip,  CV:  No-   chest pain, orthopnea, PND, swelling in lower extremities, anasarca, dizziness, palpitations Resp: No-   shortness of breath with exertion or at rest.              No-   productive cough,  No non-productive cough,  No- coughing up of blood.              No-   change in color of mucus.  No- wheezing.   Skin: No-   rash or lesions. GI:  No-   heartburn, indigestion, abdominal pain, nausea, vomiting,  GU: No-   dysuria,  MS:  No-   joint pain or swelling.   Neuro-     nothing unusual Psych:  No- change in mood or affect. No depression or anxiety.  No memory loss.  OBJ- Physical Exam General- Alert, Oriented, Affect-appropriate, Distress- none acute. No parotid mass palpated Skin- rash-none, lesions- none, excoriation- none Lymphadenopathy- none Head- atraumatic            Eyes- Gross vision intact, PERRLA, conjunctivae and secretions clear            Ears- Hearing, canals-normal            Nose- Clear, no-Septal dev, mucus, polyps, erosion, perforation             Throat- Mallampati III-IV , mucosa clear , drainage- none, tonsils- atrophic Neck- flexible , trachea midline, no stridor , thyroid nl, carotid no bruit Chest - symmetrical excursion , unlabored           Heart/CV- RRR , no murmur , no gallop  , no rub, nl s1 s2                           - JVD- none , edema- none, stasis changes- none, varices- none           Lung- clear to P&A, wheeze- none,  cough- none , dullness-none, rub- none           Chest wall-  Abd- tender-no, distended-no, bowel sounds-present, HSM- no Br/ Gen/ Rectal- Not done,  not indicated Extrem- cyanosis- none, clubbing, none, atrophy- none, strength- nl Neuro- grossly intact to observation. Neg Chvostek's. No facial asymmetry Close off note here

## 2011-09-29 ENCOUNTER — Ambulatory Visit (INDEPENDENT_AMBULATORY_CARE_PROVIDER_SITE_OTHER): Payer: 59 | Admitting: Pulmonary Disease

## 2011-09-29 ENCOUNTER — Encounter: Payer: Self-pay | Admitting: Pulmonary Disease

## 2011-09-29 VITALS — BP 132/72 | HR 75 | Temp 98.5°F | Ht 69.5 in | Wt 255.2 lb

## 2011-09-29 DIAGNOSIS — G4733 Obstructive sleep apnea (adult) (pediatric): Secondary | ICD-10-CM

## 2011-09-29 NOTE — Assessment & Plan Note (Signed)
The patient has a history of severe obstructive sleep apnea, but appears to be doing better on CPAP this time around set on the automatic mode.  He feels that he sleeps fairly well with the device, but has not seen a big change in his daytime alertness as of yet.  He is having some mild issues with mask leaking, but it is not overly significant.  He will work on this with his DME, but is to call if the problem persists.  I will also get a download to make sure that we are adequately treating his sleep apnea as well.  Finally, I encouraged him to work aggressively on weight loss.

## 2011-09-29 NOTE — Progress Notes (Signed)
  Subjective:    Patient ID: Eugene [name], male    DOB: [birth year], 66 y.o.   MRN: [id number]  HPI The patient comes in today for followup of his known severe obstructive sleep apnea.  He was started on CPAP at the last visit with the automatic mode, and has done better with the device than in the past.  He is wearing compliantly, and feels that he does sleep better.  He has yet to see a change in his daytime alertness.  He denies any issues with the pressure, but is having occasional leaks with his mask.  He is working with his DME on this.   Review of Systems  Constitutional: Negative.  Negative for fever and unexpected weight change.  HENT: Positive for sore throat and rhinorrhea. Negative for ear pain, nosebleeds, congestion, sneezing, trouble swallowing, dental problem, postnasal drip and sinus pressure.   Eyes: Positive for itching. Negative for redness.  Respiratory: Negative.  Negative for cough, chest tightness, shortness of breath and wheezing.   Cardiovascular: Negative.  Negative for palpitations and leg swelling.  Gastrointestinal: Negative.  Negative for nausea and vomiting.  Genitourinary: Negative.  Negative for dysuria.  Musculoskeletal: Negative.  Negative for joint swelling.  Skin: Negative.  Negative for rash.  Neurological: Negative.  Negative for headaches.  Hematological: Negative.  Does not bruise/bleed easily.  Psychiatric/Behavioral: Negative.  Negative for dysphoric mood. The patient is not nervous/anxious.        Objective:   Physical Exam Obese male in no acute distress Nose without purulence or discharge noted No skin breakdown or pressure necrosis from the CPAP mask Mild lower extremity edema, no cyanosis Alert, does not appear to be overly sleepy, moves all 4 extremities.       Assessment & Plan:

## 2011-09-29 NOTE — Patient Instructions (Signed)
Continue with cpap, but let us know if you continue to have mask issues. Work on weight loss followup with me in 6mos.

## 2011-10-20 ENCOUNTER — Encounter: Payer: Self-pay | Admitting: Internal Medicine

## 2011-10-20 ENCOUNTER — Ambulatory Visit (INDEPENDENT_AMBULATORY_CARE_PROVIDER_SITE_OTHER)
Admission: RE | Admit: 2011-10-20 | Discharge: 2011-10-20 | Disposition: A | Discharge status: Home / Self Care | Payer: Medicare PPO | Source: Ambulatory Visit | Attending: Internal Medicine | Admitting: Internal Medicine

## 2011-10-20 ENCOUNTER — Ambulatory Visit (INDEPENDENT_AMBULATORY_CARE_PROVIDER_SITE_OTHER): Payer: Medicare PPO | Admitting: Internal Medicine

## 2011-10-20 VITALS — BP 140/88 | HR 67 | Temp 98.6°F | Ht 69.5 in | Wt 251.2 lb

## 2011-10-20 DIAGNOSIS — J209 Acute bronchitis, unspecified: Secondary | ICD-10-CM

## 2011-10-20 DIAGNOSIS — J301 Allergic rhinitis due to pollen: Secondary | ICD-10-CM

## 2011-10-20 MED ORDER — AZELASTINE-FLUTICASONE 137-50 MCG/ACT NA SUSP: 2.0000 | NASAL | Status: DC

## 2011-10-20 NOTE — Progress Notes (Signed)
Quick Note:  Pt aware of results. ______ 

## 2011-10-20 NOTE — Patient Instructions (Addendum)
Script for Dymista nasal spray   1 or 2 puffs each nostril every night at bedtime. Try this instead of astelin and flonase.  Sample Advair 100   1 puff and rinse mouth, twice daily.  Order CXR    Dx Acute bronchitis

## 2011-10-20 NOTE — Progress Notes (Signed)
09/15/11- 65 yoM never smoker self referred for allergy evaluation. Former patient at [facility]. Now followed here by Dr Shelle Iron for OSA. Old records being obtained. He complains that for 4 or 5 months he has had numbness in the right side of his face "like at dentist". Right eye burns and walkers. The vision is okay and hearing okay. No discomfort with his teeth. This sensation has been persistent without change. Dr Thayer Ohm Newman/ ENT did CT scan of sinuses on 04/01/2011 describing mild mucosal thickening with no air-fluid levels. 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 No mass. He says nasal congestion is such that he has to mouth breathe but congestion shifts back and forth. He denies ENT trauma or surgery. Has been taking Astelin and fluticasone nasal sprays with little benefit. 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  10/20/11- 65 yoM never smoker self referred for allergy evaluation. Former patient at [facility]. Now followed here by Dr Shelle Iron for OSA. At last visit we gave her a nasal nebulizer treatment and sample of Dymista, which seemed to help. When the sample ran out, he went back to [employer] which doesn't work as well. He is just using Astelin now. Last week he had a viral chest cold with hard cough since then has had a dull persistent substernal ache increased by reaching or lifting with his arms. Still has some productive cough with white sputum. Some wheeze.  ROS-see HPI Constitutional:   No-   weight loss, night sweats, fevers, chills, fatigue, lassitude. HEENT:   No-  headaches, difficulty swallowing, tooth/dental problems, sore throat,       +  sneezing, itching, ear ache, nasal congestion, post nasal drip,  CV:  No-   chest pain, orthopnea, PND, swelling in lower extremities, anasarca, dizziness, palpitations Resp: No-   shortness of breath with exertion or at rest.              No-   productive cough,  No non-productive  cough,  No- coughing up of blood.              No-   change in color of mucus.  + wheezing.   Skin: No-   rash or lesions. GI:  No-   heartburn, indigestion, abdominal pain, nausea, vomiting,  GU: No-   dysuria,  MS:  No-   joint pain or swelling.   Neuro-     nothing unusual Psych:  No- change in mood or affect. No depression or anxiety.  No memory loss.  OBJ- Physical Exam General- Alert, Oriented, Affect-appropriate, Distress- none acute. No parotid mass palpated Skin- rash-none, lesions- none, excoriation- none Lymphadenopathy- none Head- atraumatic            Eyes- Gross vision intact, PERRLA, conjunctivae and secretions clear            Ears- Hearing, canals-normal            Nose- turbinate edema, no-Septal dev, mucus, polyps, erosion, perforation             Throat- Mallampati III-IV , mucosa clear , drainage- none, tonsils- atrophic Neck- flexible , trachea midline, no stridor , thyroid nl, carotid no bruit Chest - symmetrical excursion , unlabored           Heart/CV- RRR , no murmur , no gallop  , no rub, nl s1 s2                           -  JVD- none , edema- none, stasis changes- none, varices- none           Lung- clear to P&A, +wheeze, cough- none , dullness-none, rub- none           Chest wall-  Abd- tender-no, distended-no, bowel sounds-present, HSM- no Br/ Gen/ Rectal- Not done, not indicated Extrem- cyanosis- none, clubbing, none, atrophy- none, strength- nl Neuro- grossly intact to observation. Neg Chvostek's. No facial asymmetry

## 2011-10-26 NOTE — Assessment & Plan Note (Addendum)
He is going to continue using his sample of Astelin antihistamine nasal spray. He did not find steroid nasal spray that would benefit him. He strongly prefers Dymista. If his insurance will cover it that drug, we will use Flonase plus Astelin nasal spray. Sample Advair 100 for trial with education.

## 2011-11-24 ENCOUNTER — Telehealth: Payer: Self-pay | Admitting: *Deleted

## 2011-11-24 MED ORDER — DICLOFENAC SODIUM 1 % TD GEL: 1.0000 "application " | Freq: Four times a day (QID) | TRANSDERMAL | Status: DC

## 2011-11-24 NOTE — Telephone Encounter (Signed)
Left msg on vm needing refill on his voltaren gel. Notified pt will send med to [facility]... 11/24/11@1 :37pm/LMB

## 2011-12-02 ENCOUNTER — Other Ambulatory Visit (INDEPENDENT_AMBULATORY_CARE_PROVIDER_SITE_OTHER): Payer: Medicare PPO

## 2011-12-02 ENCOUNTER — Ambulatory Visit (INDEPENDENT_AMBULATORY_CARE_PROVIDER_SITE_OTHER): Payer: Medicare PPO | Admitting: Internal Medicine

## 2011-12-02 ENCOUNTER — Encounter: Payer: Self-pay | Admitting: Internal Medicine

## 2011-12-02 VITALS — BP 148/80 | HR 73 | Temp 99.1°F | Ht 69.5 in | Wt 250.6 lb

## 2011-12-02 DIAGNOSIS — I10 Essential (primary) hypertension: Secondary | ICD-10-CM

## 2011-12-02 DIAGNOSIS — N529 Male erectile dysfunction, unspecified: Secondary | ICD-10-CM

## 2011-12-02 DIAGNOSIS — M1990 Unspecified osteoarthritis, unspecified site: Secondary | ICD-10-CM

## 2011-12-02 LAB — LIPID PANEL
Cholesterol: 145 mg/dL (ref 0–200)
HDL: 50.2 mg/dL (ref >39.00)
Triglycerides: 50 mg/dL (ref 0.0–149.0)

## 2011-12-02 LAB — BASIC METABOLIC PANEL
CO2: 29 mEq/L (ref 19–32)
Calcium: 9.2 mg/dL (ref 8.4–10.5)
Chloride: 108 mEq/L (ref 96–112)
Creatinine, Ser: 1.1 mg/dL (ref 0.4–1.5)
Glucose, Bld: 88 mg/dL (ref 70–99)

## 2011-12-02 MED ORDER — NABUMETONE 750 MG PO TABS: 750.0000 mg | ORAL_TABLET | Freq: Two times a day (BID) | ORAL | Status: DC

## 2011-12-02 NOTE — Assessment & Plan Note (Signed)
BP Readings from Last 3 Encounters:  12/02/11 148/80  10/20/11 140/88  09/29/11 132/72   The current medical regimen is effective;  continue present plan and medications.

## 2011-12-02 NOTE — Assessment & Plan Note (Signed)
Hx mildly low testosterone 2012 (331) Discussion on uncertainty around recommended levels for age - will defer recheck at this time and continue Viagra prn Lab Results  Component Value Date   TESTOSTERONE 331.67* 03/22/2011

## 2011-12-02 NOTE — Assessment & Plan Note (Signed)
Regular NSAIDs - B knees and hands Also history of R shoulder tendonitis with overuse, now improved - Continue NSAIDs but stop topical voltaren due to cost Resume generic Relafen but reminded not to take at same time as oral Aleve

## 2011-12-02 NOTE — Patient Instructions (Signed)
It was good to see you today. We have reviewed your prior records including labs and tests today Medications reviewed, will resume nambutone for osteoarthritis - no other changes at this time. Refill on medication(s) as discussed today to mail order  Test(s) ordered today. Your results will be called to you after review (48-72hours after test completion). If any changes need to be made, you will be notified at that time. Continue to work with your other medical specialists as ongoing Please schedule followup in 6 months for blood pressure review, call sooner if problems.

## 2011-12-02 NOTE — Progress Notes (Signed)
Subjective:    Patient ID: Eugene [name], male    DOB: [birth year], 66 y.o.   MRN: [id number]  HPI  Here for follow up -eviewed chronic medical issues:  allergic rhinitis - chronic but seasonally worse - has seen ENT with 03/2011 CT - s/p eval by allergist 09/2011 - only using flonase due to PA problems with astelin  hypertension - the patient reports compliance with medication(s) as prescribed. Denies adverse side effects.  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 follows with cardiology for same every 6-12 months. No edema, shortness of breath or other symptoms. Last TEE 07/2011 stable  OSA. hx severe disease when diagnosed in 2000. On CPAP 2000- 2005, resumed auto titration CPAP March 2013 at direction of pulmonary - reports continued problems with mask and sleep  osteoarthritis - B knees - also shoulders - uses NSAIDs for same - topical and oral, but voltaren gel not covered - no swelling - no limited ROM, no injury/trauma - no weakness or numbness  Past Medical History  Diagnosis Date  . Hypertension   . Rheumatic fever     residual pulm stenosis, follows with ganji every 42mo  . Anxiety   . Allergic rhinitis due to pollen   . ED (erectile dysfunction)   . OBESITY   . Osteoarthritis   . RHINITIS, CHRONIC   . SLEEP APNEA, OBSTRUCTIVE dx 2000    NPSG 2000:  AHI 76/hr CPAP titrated to 11cm 2002   . Pulmonary stenosis, valvar     TEE 07/2011: stable, LVEF 55%, small PFO  . Refusal of blood transfusions as patient is Jehovah's Witness      Review of Systems  Constitutional: Negative for fever, fatigue and unexpected weight change.  Respiratory: Negative for cough and shortness of breath.   Cardiovascular: Negative for chest pain, palpitations and leg swelling.  Gastrointestinal: Negative for abdominal pain and blood in stool.       Objective:   Physical Exam  BP 148/80  Pulse 73  Temp 99.1 F (37.3 C) (Temporal)  Ht 5'  9.5" (1.765 m)  Wt 250 lb 9.6 oz (113.671 kg)  BMI 36.48 kg/m2  SpO2 97% Wt Readings from Last 3 Encounters:  12/02/11 250 lb 9.6 oz (113.671 kg)  10/20/11 251 lb 3.2 oz (113.944 kg)  09/29/11 255 lb 3.2 oz (115.758 kg)   Constitutional:  He appears well-developed and well-nourished. No distress.  Neck: Thick. Normal range of motion. Neck supple. No JVD present. No thyromegaly present.  Cardiovascular: Normal rate, regular rhythm and normal heart sounds.  No murmur heard. no BLE edema Pulmonary/Chest: Effort normal and breath sounds normal. No respiratory distress. no wheezes.  Abdominal: Soft. Bowel sounds are normal. Patient exhibits no distension. There is no tenderness.  Musculoskeletal: Right Shoulder: Full range of motion. Neurovascularly intact distally. Good strength with stress of rotator cuff but causes pain. Positive impingement signs. Bilateral knees - boggy synovitis - tender to palpation over joint line; FROM and ligamentous function intact Neurological: he is alert and oriented to person, place, and time. No cranial nerve deficit. Coordination normal.  Psychiatric: he has a normal mood and affect. behavior is normal. Judgment and thought content normal.   Lab Results  Component Value Date   WBC 6.0 03/22/2011   HGB 14.0 03/22/2011   HCT 42.4 03/22/2011   PLT 154.0 03/22/2011   GLUCOSE 77 03/22/2011   NA 141 03/22/2011   K 4.5 03/22/2011   CL 104  03/22/2011   CREATININE 1.1 03/22/2011   BUN 14 03/22/2011   CO2 31 03/22/2011   PSA 1.38 03/22/2011   PSA 1.52 03/22/2011      Assessment & Plan:  See problem list. Medications and labs reviewed today.

## 2011-12-21 ENCOUNTER — Other Ambulatory Visit: Payer: Self-pay | Admitting: *Deleted

## 2011-12-21 DIAGNOSIS — F411 Generalized anxiety disorder: Secondary | ICD-10-CM

## 2011-12-21 MED ORDER — CLONAZEPAM 1 MG PO TABS: 1.0000 mg | ORAL_TABLET | Freq: Every day | ORAL | Status: DC

## 2011-12-21 NOTE — Telephone Encounter (Signed)
Faxed script back to [facility]... 12/21/11@1 :29pm/lmb

## 2012-01-11 ENCOUNTER — Ambulatory Visit (INDEPENDENT_AMBULATORY_CARE_PROVIDER_SITE_OTHER): Payer: Medicare PPO | Admitting: Internal Medicine

## 2012-01-11 ENCOUNTER — Encounter: Payer: Self-pay | Admitting: Internal Medicine

## 2012-01-11 VITALS — BP 142/76 | HR 86 | Temp 99.3°F | Ht 69.0 in | Wt 253.2 lb

## 2012-01-11 DIAGNOSIS — M79604 Pain in right leg: Secondary | ICD-10-CM | POA: Insufficient documentation

## 2012-01-11 DIAGNOSIS — M79609 Pain in unspecified limb: Secondary | ICD-10-CM

## 2012-01-11 DIAGNOSIS — I10 Essential (primary) hypertension: Secondary | ICD-10-CM

## 2012-01-11 MED ORDER — MELOXICAM 7.5 MG PO TABS: 7.5000 mg | ORAL_TABLET | Freq: Two times a day (BID) | ORAL | Status: DC | PRN

## 2012-01-11 MED ORDER — DOXYCYCLINE HYCLATE 100 MG PO TABS: 100.0000 mg | ORAL_TABLET | Freq: Two times a day (BID) | ORAL | Status: AC

## 2012-01-11 NOTE — Assessment & Plan Note (Signed)
With what appears to be swelling and tightness/discomfort diffusely to leg to just above the right knee - diff includes knee effusion/djd, bursitis, DVT or cellulitis; for now for doxy course, nsaid prn, r/o DVT with LE venous dopplers, and f/u with PCP - consider orthopedic if not improved

## 2012-01-11 NOTE — Patient Instructions (Addendum)
Take all new medications as prescribed - the antibiotic, and pain medication Continue all other medications as before You will be contacted regarding the referral for: right leg venous doppler (right leg vein test to make sure no deep blood clot) - the office will call Please return in 1 week to Dr Felicity Coyer (the office will call)

## 2012-01-11 NOTE — Progress Notes (Signed)
Subjective:    Patient ID: Eugene [name], male    DOB: 03-15-1946, 66 y.o.   MRN: [id number]  HPI  Pt of Dr Felicity Coyer, seen acutely, c/o ? Insect stings to medial right leg/knee area 2 wks ago with wearing shorts outside resulting in problem today; c/o mild to mod primarily right knee and leg swelling acutely for 3-4 days with pain and redness mostly at the knee level but tightness and swelling of the knee and distal to toes as well, and ? Some discomfort to right calft/tender with ambulation;  Had tried cortisone and bendaryl without help, nothing else makes better or worse.   Pt denies fever, wt loss, night sweats, loss of appetite, or other constitutional symptoms  No knee giveaways or falls, no recent kneeling.  Pt denies chest pain, increased sob or doe, wheezing, orthopnea, PND, increased LE swelling, palpitations, dizziness or syncope.  Pt denies new neurological symptoms such as new headache, or facial or extremity weakness or numbness   Pt denies polydipsia, polyuria. Past Medical History  Diagnosis Date  . Hypertension   . Rheumatic fever     residual pulm stenosis, follows with ganji every 41mo  . Anxiety   . Allergic rhinitis due to pollen   . ED (erectile dysfunction)   . OBESITY   . Osteoarthritis   . RHINITIS, CHRONIC   . SLEEP APNEA, OBSTRUCTIVE dx 2000    NPSG 2000:  AHI 76/hr CPAP titrated to 11cm 2002   . Pulmonary stenosis, valvar     TEE 07/2011: stable, LVEF 55%, small PFO  . Refusal of blood transfusions as patient is Jehovah's Witness    Past Surgical History  Procedure Date  . Tee without cardioversion 08/02/2011    Procedure: TRANSESOPHAGEAL ECHOCARDIOGRAM (TEE);  Surgeon: Pamella Pert, MD;  Location: [facility] ENDOSCOPY;  Service: Cardiovascular;  Laterality: N/A;    reports that he has never smoked. He does not have any smokeless tobacco history on file. He reports that he drinks alcohol. He reports that he does not use illicit drugs. family history includes Heart  disease in his father and mother and Hypertension in his other. Allergies  Allergen Reactions  . Penicillins    Current Outpatient Prescriptions on File Prior to Visit  Medication Sig Dispense Refill  . ALPHA LIPOIC ACID PO Take 2 capsules by mouth daily.       Marland Kitchen amLODipine (NORVASC) 10 MG tablet Take 1 tablet (10 mg total) by mouth daily.  90 tablet  3  . azelastine (ASTELIN) 137 MCG/SPRAY nasal spray Place 1 spray into the nose 2 (two) times daily. Use in each nostril as directed  30 mL  11  . Azelastine-Fluticasone (DYMISTA) 137-50 MCG/ACT SUSP Place 2 puffs into the nose 1 day or 1 dose.  1 Bottle  prn  . carvedilol (COREG) 6.25 MG tablet Take 1 tablet (6.25 mg total) by mouth 2 (two) times daily with a meal.  180 tablet  3  . Chlorpheniramine-DM (CORICIDIN HBP COUGH/COLD PO) Take by mouth. Take by mouth as needed      . clonazePAM (KLONOPIN) 1 MG tablet Take 1 tablet (1 mg total) by mouth daily.  30 tablet  1  . fluticasone (FLONASE) 50 MCG/ACT nasal spray Place 1 spray into the nose 2 (two) times daily.      Marland Kitchen GREEN COFFEE BEAN PO Take 1 capsule by mouth daily.      Marland Kitchen losartan (COZAAR) 50 MG tablet Take 1 tablet (50 mg total) by  mouth 2 (two) times daily.  180 tablet  3  . Misc Natural Products (OSTEO BI-FLEX ADV DOUBLE ST PO) Take 2 capsules by mouth daily.      . Multiple Vitamins-Minerals (MULTIVITAMIN WITH MINERALS) tablet Take 1 tablet by mouth daily.        . nabumetone (RELAFEN) 750 MG tablet Take 1 tablet (750 mg total) by mouth 2 (two) times daily.  60 tablet  2  . Naproxen Sodium (ALEVE) 220 MG CAPS Take 2 capsules by mouth 4 (four) times daily.       . NON FORMULARY Take 330 mg by mouth 2 (two) times daily. [place]  Dietary supplement      . OVER THE COUNTER MEDICATION Curamin-for pain      . OVER THE COUNTER MEDICATION T Male-testoaterone,boost      . VIAGRA 100 MG tablet Take 1 tablet (100 mg total) by mouth as needed for erectile dysfunction.  6 tablet  11   Review of  Systems Constitutional: Negative for diaphoresis and unexpected weight change.  HENT: Negative for tinnitus.   Eyes: Negative for photophobia and visual disturbance.  Respiratory: Negative for cough or ST   Gastrointestinal: Negative for vomiting and blood in stool.  Genitourinary: Negative for hematuria and decreased urine volume.  Musculoskeletal: Negative for other acute joint swelling Skin: Negative for ulcer or wound.  Neurological: Negative for tremors and numbness.    Objective:   Physical Exam BP 142/76  Pulse 86  Temp 99.3 F (37.4 C) (Oral)  Ht 5\' 9"  (1.753 m)  Wt 253 lb 4 oz (114.873 kg)  BMI 37.40 kg/m2  SpO2 96% Physical Exam  VS noted Constitutional: Pt appears well-developed and well-nourished.  HENT: Head: Normocephalic.  Right Ear: External ear normal.  Left Ear: External ear normal.  Eyes: Conjunctivae and EOM are normal. Pupils are equal, round, and reactive to light.  Neck: Normal range of motion. Neck supple.  Cardiovascular: Normal rate and regular rhythm.   Pulmonary/Chest: Effort normal and breath sounds normal.  Neurological: Pt is alert. Not confused Skin: Skin is warm. No erythema. No ulcer or wound or significant insect bite noted Right knee with primarily anterior mild erythema and moderate tenderness, ? Knee effusion but FROM, no crepitus, leg with 1-2+ edema/tightness, mild right calf tightness, 1+ dorsalis pedis pulse o/w motor/dtr intact to RLE Psychiatric: Pt behavior is normal. Thought content normal.     Assessment & Plan:

## 2012-01-12 ENCOUNTER — Other Ambulatory Visit: Payer: Self-pay | Admitting: Cardiology

## 2012-01-12 ENCOUNTER — Encounter (INDEPENDENT_AMBULATORY_CARE_PROVIDER_SITE_OTHER): Payer: Medicare PPO

## 2012-01-12 DIAGNOSIS — M7989 Other specified soft tissue disorders: Secondary | ICD-10-CM

## 2012-01-12 DIAGNOSIS — M79609 Pain in unspecified limb: Secondary | ICD-10-CM

## 2012-01-12 DIAGNOSIS — R609 Edema, unspecified: Secondary | ICD-10-CM

## 2012-01-13 ENCOUNTER — Encounter: Payer: Self-pay | Admitting: Internal Medicine

## 2012-01-15 ENCOUNTER — Encounter: Payer: Self-pay | Admitting: Internal Medicine

## 2012-01-15 NOTE — Assessment & Plan Note (Signed)
stable overall by hx and exam, most recent data reviewed with pt, and pt to continue medical treatment as before BP Readings from Last 3 Encounters:  01/11/12 142/76  12/02/11 148/80  10/20/11 140/88

## 2012-02-09 ENCOUNTER — Ambulatory Visit (INDEPENDENT_AMBULATORY_CARE_PROVIDER_SITE_OTHER): Payer: Medicare PPO | Admitting: Internal Medicine

## 2012-02-09 ENCOUNTER — Encounter: Payer: Self-pay | Admitting: Internal Medicine

## 2012-02-09 VITALS — BP 140/74 | HR 71 | Temp 98.5°F | Resp 16 | Wt 251.0 lb

## 2012-02-09 DIAGNOSIS — M79609 Pain in unspecified limb: Secondary | ICD-10-CM

## 2012-02-09 DIAGNOSIS — M1990 Unspecified osteoarthritis, unspecified site: Secondary | ICD-10-CM

## 2012-02-09 DIAGNOSIS — M79604 Pain in right leg: Secondary | ICD-10-CM

## 2012-02-09 DIAGNOSIS — I10 Essential (primary) hypertension: Secondary | ICD-10-CM

## 2012-02-09 MED ORDER — CARVEDILOL 25 MG PO TABS: 25.0000 mg | ORAL_TABLET | Freq: Two times a day (BID) | ORAL | Status: DC

## 2012-02-09 MED ORDER — AMLODIPINE BESYLATE 10 MG PO TABS: 5.0000 mg | ORAL_TABLET | Freq: Every day | ORAL | Status: DC

## 2012-02-09 MED ORDER — HYDROCHLOROTHIAZIDE 25 MG PO TABS: 25.0000 mg | ORAL_TABLET | Freq: Every day | ORAL | Status: DC

## 2012-02-09 NOTE — Progress Notes (Signed)
Subjective:    Patient ID: Eugene [name], male    DOB: [birth year], 66 y.o.   MRN: [id number]  HPI  Here for follow up RLE/knee swelling Seen for same 12/2011 by my partner - Improved but still "tight" in knee with climbing, no knee pain or recurrent swelling ?changes in BP meds for edema per cards  Also reviewed chronic medical issues:  allergic rhinitis - chronic but seasonally worse - has seen ENT with 03/2011 CT - s/p eval by allergist 09/2011 - only using flonase due to PA problems with astelin  hypertension - the patient reports compliance with medication(s) as prescribed. Denies adverse side effects.  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 follows with cardiology for same every 6-12 months. No edema, shortness of breath or other symptoms. Last TEE 07/2011 stable  OSA. hx severe disease when diagnosed in 2000. On CPAP 2000- 2005, resumed auto titration CPAP March 2013 at direction of pulmonary - reports continued problems with mask and sleep  osteoarthritis - B knees - also shoulders - uses NSAIDs for same - topical and oral, but voltaren gel not covered - no swelling - no limited ROM, no injury/trauma - no weakness or numbness  Past Medical History  Diagnosis Date  . Hypertension   . Rheumatic fever     residual pulm stenosis, follows with ganji every 59mo  . Anxiety   . Allergic rhinitis due to pollen   . ED (erectile dysfunction)   . OBESITY   . Osteoarthritis   . RHINITIS, CHRONIC   . SLEEP APNEA, OBSTRUCTIVE dx 2000    NPSG 2000:  AHI 76/hr CPAP titrated to 11cm 2002   . Pulmonary stenosis, valvar     TEE 07/2011: stable, LVEF 55%, small PFO  . Refusal of blood transfusions as patient is Jehovah's Witness     Review of Systems  Constitutional: Negative for fever, fatigue and unexpected weight change.  Respiratory: Negative for cough and shortness of breath.   Cardiovascular: Negative for chest pain, palpitations and  leg swelling.  Gastrointestinal: Negative for abdominal pain and blood in stool.       Objective:   Physical Exam  BP 140/74  Pulse 71  Temp 98.5 F (36.9 C) (Oral)  Resp 16  Wt 251 lb (113.853 kg)  SpO2 95% Wt Readings from Last 3 Encounters:  02/09/12 251 lb (113.853 kg)  01/11/12 253 lb 4 oz (114.873 kg)  12/02/11 250 lb 9.6 oz (113.671 kg)   Constitutional:  He is overweight, but appears well-developed and well-nourished. No distress.  Neck: Thick. Normal range of motion. Neck supple. No JVD present. No thyromegaly present.  Cardiovascular: Normal rate, regular rhythm and normal heart sounds.  No murmur heard. trace BLE edema Pulmonary/Chest: Effort normal and breath sounds normal. No respiratory distress. no wheezes.  Musculoskeletal: R knee - knee - boggy synovitis - tender to palpation over joint line; FROM and ligamentous function intact Neurological: he is alert and oriented to person, place, and time. No cranial nerve deficit. Coordination normal.  Psychiatric: he has a normal mood and affect. behavior is normal. Judgment and thought content normal.   Lab Results  Component Value Date   WBC 6.0 03/22/2011   HGB 14.0 03/22/2011   HCT 42.4 03/22/2011   PLT 154.0 03/22/2011   GLUCOSE 88 12/02/2011   CHOL 145 12/02/2011   TRIG 50.0 12/02/2011   HDL 50.20 12/02/2011   LDLCALC 85 12/02/2011   NA 142  12/02/2011   K 3.9 12/02/2011   CL 108 12/02/2011   CREATININE 1.1 12/02/2011   BUN 17 12/02/2011   CO2 29 12/02/2011   PSA 1.38 03/22/2011   PSA 1.52 03/22/2011      Assessment & Plan:  See problem list. Medications and labs reviewed today.

## 2012-02-09 NOTE — Assessment & Plan Note (Signed)
01/11/12 OV reviewed: swelling and tightness/discomfort diffusely to leg to just above the right knee -  Resolved s/p doxy course, nsaid prn  consider orthopedic if not improved given chronic OA issues

## 2012-02-09 NOTE — Assessment & Plan Note (Signed)
BP Readings from Last 3 Encounters:  02/09/12 140/74  01/11/12 142/76  12/02/11 148/80   Will make small med changes due to mild edema - see meds below Recheck 6 weeks, sooner if problems

## 2012-02-09 NOTE — Assessment & Plan Note (Signed)
Regular NSAID use - B knees and hands history of R shoulder tendonitis with overuse, now improved - stopped topical voltaren due to cost Resume generic Relafen but reminded not to take at same time as oral Aleve

## 2012-02-09 NOTE — Patient Instructions (Signed)
It was good to see you today. We have reviewed your prior records including labs and tests today Medications reviewed: Reduce dose of amlodipine to half tablet (5 mg) once daily, increase dose Coreg to 25 mg twice daily, start hydrochlorothiazide 25 mg daily for swelling as needed Your prescription(s) have been submitted to your pharmacy. Please take as directed and contact our office if you believe you are having problem(s) with the medication(s).  Continue to work with your other medical specialists as ongoing Please schedule followup in 6 weeks for blood pressure review and knee check, call sooner if problems.  Flu shot done today

## 2012-02-20 ENCOUNTER — Encounter: Payer: Self-pay | Admitting: Internal Medicine

## 2012-02-20 ENCOUNTER — Ambulatory Visit (INDEPENDENT_AMBULATORY_CARE_PROVIDER_SITE_OTHER): Payer: Medicare PPO | Admitting: Internal Medicine

## 2012-02-20 VITALS — BP 144/80 | HR 71 | Ht 69.5 in | Wt 253.0 lb

## 2012-02-20 DIAGNOSIS — J301 Allergic rhinitis due to pollen: Secondary | ICD-10-CM

## 2012-02-20 MED ORDER — FLUCONAZOLE 150 MG PO TABS: 150.0000 mg | ORAL_TABLET | Freq: Once | ORAL | Status: DC

## 2012-02-20 NOTE — Progress Notes (Signed)
09/15/11- 65 yoM never smoker self referred for allergy evaluation. Former patient at [facility]. Now followed here by Dr Shelle Iron for OSA. Old records being obtained. He complains that for 4 or 5 months he has had numbness in the right side of his face "like at dentist". Right eye burns and walkers. The vision is okay and hearing okay. No discomfort with his teeth. This sensation has been persistent without change. Dr Thayer Ohm Newman/ ENT did CT scan of sinuses on 04/01/2011 describing mild mucosal thickening with no air-fluid levels. 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 No mass. He says nasal congestion is such that he has to mouth breathe but congestion shifts back and forth. He denies ENT trauma or surgery. Has been taking Astelin and fluticasone nasal sprays with little benefit. 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  10/20/11- 65 yoM never smoker self referred for allergy evaluation. Former patient at [facility]. Now followed here by Dr Shelle Iron for OSA. At last visit we gave him a nasal nebulizer treatment and sample of Dymista, which seemed to help. When the sample ran out, he went back to [employer] which doesn't work as well. He is just using Astelin now. Last week he had a viral chest cold with hard cough since then has had a dull persistent substernal ache increased by reaching or lifting with his arms. Still has some productive cough with white sputum. Some wheeze.  02/20/12- 65 yoM never smoker self referred for allergy evaluation. Former patient at [facility]. Now followed here by Dr Shelle Iron for OSA.  last Thursday had sinus headache and could not get rid of; overall has good and bad days; congestion at times. He had been outdoors all day and the next day developed persistent frontal headache. Headache is now clear but he has residual nasal congestion. Dymista nasal spray has been some help. He asks about chronic coated tongue despite rush  but with his toothbrush and using mouthwash. He is not diabetic. Aware of postnasal drip.  ROS-see HPI Constitutional:   No-   weight loss, night sweats, fevers, chills, fatigue, lassitude. HEENT:   No-  headaches, difficulty swallowing, tooth/dental problems, sore throat,       +  sneezing, itching, ear ache, nasal congestion, post nasal drip,  CV:  No-   chest pain, orthopnea, PND, swelling in lower extremities, anasarca, dizziness, palpitations Resp: No-   shortness of breath with exertion or at rest.              No-   productive cough,  No non-productive cough,  No- coughing up of blood.              No-   change in color of mucus.  No- wheezing.   Skin: No-   rash or lesions. GI:  No-   heartburn, indigestion, abdominal pain, nausea, vomiting,  GU: No-   dysuria,  MS:  No-   joint pain or swelling.   Neuro-     nothing unusual Psych:  No- change in mood or affect. No depression or anxiety.  No memory loss.  OBJ- Physical Exam General- Alert, Oriented, Affect-appropriate, Distress- none acute.  Skin- rash-none, lesions- none, excoriation- none Lymphadenopathy- none Head- atraumatic            Eyes- Gross vision intact, PERRLA, conjunctivae and secretions clear            Ears- Hearing, canals-normal  Nose- turbinate edema, no-Septal dev, mucus, polyps, erosion, perforation             Throat- Mallampati III-IV , + tongue coated , drainage- none, tonsils- atrophic Neck- flexible , trachea midline, no stridor , thyroid nl, carotid no bruit Chest - symmetrical excursion , unlabored           Heart/CV- RRR , no murmur , no gallop  , no rub, nl s1 s2                           - JVD- none , edema- none, stasis changes- none, varices- none           Lung- clear to P&A, +wheeze, cough- none , dullness-none, rub- none           Chest wall-  Abd-  Br/ Gen/ Rectal- Not done, not indicated Extrem- cyanosis- none, clubbing, none, atrophy- none, strength- nl Neuro- grossly intact  to observation. Neg Chvostek's. No facial asymmetry

## 2012-02-20 NOTE — Patient Instructions (Addendum)
Dymista can be used twice daily if needed  Script sent for diflucan pill. Take one daily x 5 days. See if it seems to significantly improve the tongue coating, even temporarily. You can also ask your dentist what they recommend for coated tongue.  It may help your nasal congestion/ headaches to use a saline nasalrinse- Neti pot or squeeze bottle to rinse your nasal passages once a day if you feel congestion building.  Consider trying otc decongestant phenylephrine- available on the shelf as Sudafed-PE and other brands.

## 2012-02-26 NOTE — Assessment & Plan Note (Addendum)
Recent exacerbation after working outdoors all day in ragweed season. Dymista has been helpful. We discussed trial of Sudafed or phenylephrine. He complains of coated tongue which might be from mouth breathing. It does not really look like thrush and I suggested he ask his dentist about mouth care products. If nasal congestion is causing mouth breathing at night, then a decongestant and anti-inflammatory nasal spray may help

## 2012-03-22 ENCOUNTER — Encounter: Payer: Self-pay | Admitting: Internal Medicine

## 2012-03-22 ENCOUNTER — Telehealth: Payer: Self-pay | Admitting: *Deleted

## 2012-03-22 ENCOUNTER — Ambulatory Visit (INDEPENDENT_AMBULATORY_CARE_PROVIDER_SITE_OTHER): Payer: Medicare PPO | Admitting: Internal Medicine

## 2012-03-22 VITALS — BP 112/62 | HR 70 | Temp 98.8°F | Ht 69.5 in | Wt 249.0 lb

## 2012-03-22 DIAGNOSIS — Z125 Encounter for screening for malignant neoplasm of prostate: Secondary | ICD-10-CM

## 2012-03-22 DIAGNOSIS — Z0000 Encounter for general adult medical examination without abnormal findings: Secondary | ICD-10-CM

## 2012-03-22 DIAGNOSIS — M1990 Unspecified osteoarthritis, unspecified site: Secondary | ICD-10-CM

## 2012-03-22 DIAGNOSIS — I10 Essential (primary) hypertension: Secondary | ICD-10-CM

## 2012-03-22 DIAGNOSIS — M79604 Pain in right leg: Secondary | ICD-10-CM

## 2012-03-22 DIAGNOSIS — M79609 Pain in unspecified limb: Secondary | ICD-10-CM

## 2012-03-22 MED ORDER — CARVEDILOL 25 MG PO TABS: 25.0000 mg | ORAL_TABLET | Freq: Two times a day (BID) | ORAL | Status: DC

## 2012-03-22 MED ORDER — HYDROCHLOROTHIAZIDE 25 MG PO TABS: 25.0000 mg | ORAL_TABLET | Freq: Every day | ORAL | Status: DC

## 2012-03-22 MED ORDER — AMLODIPINE BESYLATE 5 MG PO TABS: 5.0000 mg | ORAL_TABLET | Freq: Every day | ORAL | Status: DC

## 2012-03-22 NOTE — Telephone Encounter (Signed)
Message copied by Deatra James on Thu Mar 22, 2012 10:32 AM ------      Message from: Livingston Diones      Created: Thu Mar 22, 2012  9:24 AM       Please order CPE lab work for Mr. Overdorf. He has an appt in 09/18/12.

## 2012-03-22 NOTE — Telephone Encounter (Signed)
Pt made cpx for May. Entering cpx labs in epic...lmb

## 2012-03-22 NOTE — Assessment & Plan Note (Signed)
Regular NSAID use - B knees and hands history of R shoulder tendonitis with overuse, improved s/p steroid injection- Resumed generic Relafen 01/2012 but reminded not to take at same time as otc Aleve Refer to ortho now - see above re: RLE pain symptoms > 2 months

## 2012-03-22 NOTE — Patient Instructions (Addendum)
It was good to see you today. we'll make referral to orthopedic specialist for her right knee and leg pain. Our office will contact you regarding appointment(s) once made. Medications reviewed, no new changes at this time. Refills sent to mail order pharmacy as requested Please schedule followup in 6 months for blood pressure check, call sooner if problems.

## 2012-03-22 NOTE — Assessment & Plan Note (Signed)
BP Readings from Last 3 Encounters:  03/22/12 112/62  02/20/12 144/80  02/09/12 140/74   Med changes late September 2013 reviewed - blood pressure improved The current medical regimen is effective;  continue present plan and medications.

## 2012-03-22 NOTE — Progress Notes (Signed)
Subjective:    Patient ID: Eugene [name], male    DOB: 1945/11/14, 66 y.o.   MRN: [id number]  HPI  Here for follow up:  RLE/knee swelling Seen for same 12/2011 by my partner - Improved but still "tight" in knee with climbing, no knee pain or recurrent swelling  Also reviewed chronic medical issues:  allergic rhinitis - chronic but seasonally worse - has seen ENT with 03/2011 CT - s/p eval by allergist 09/2011 - only using flonase due to PA problems with astelin  hypertension - the patient reports compliance with medication(s) as prescribed. Denies adverse side effects.  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 follows with cardiology for same every 6-12 months. No edema, shortness of breath or other symptoms. Last TEE 07/2011 stable  OSA. hx severe disease when diagnosed in 2000. On CPAP 2000- 2005, resumed auto titration CPAP March 2013 at direction of pulmonary - reports continued problems with mask and sleep  osteoarthritis - B knees - also shoulders - uses NSAIDs for same - topical and oral, but voltaren gel not covered - no swelling - no limited ROM, no injury/trauma - no weakness or numbness  Past Medical History  Diagnosis Date  . Hypertension   . Rheumatic fever     residual pulm stenosis, follows with ganji every 24mo  . Anxiety   . Allergic rhinitis due to pollen   . ED (erectile dysfunction)   . OBESITY   . Osteoarthritis   . RHINITIS, CHRONIC   . SLEEP APNEA, OBSTRUCTIVE dx 2000    NPSG 2000:  AHI 76/hr CPAP titrated to 11cm 2002   . Pulmonary stenosis, valvar     TEE 07/2011: stable, LVEF 55%, small PFO  . Refusal of blood transfusions as patient is Jehovah's Witness     Review of Systems  Constitutional: Negative for fever, fatigue and unexpected weight change.  Respiratory: Negative for cough and shortness of breath.   Cardiovascular: Negative for chest pain, palpitations and leg swelling.  Gastrointestinal:  Negative for abdominal pain and blood in stool.       Objective:   Physical Exam  BP 112/62  Pulse 70  Temp 98.8 F (37.1 C) (Oral)  Ht 5' 9.5" (1.765 m)  Wt 249 lb (112.946 kg)  BMI 36.24 kg/m2  SpO2 96% Wt Readings from Last 3 Encounters:  03/22/12 249 lb (112.946 kg)  02/20/12 253 lb (114.76 kg)  02/09/12 251 lb (113.853 kg)   Constitutional:  He is overweight, but appears well-developed and well-nourished. No distress.  Neck: Thick. Normal range of motion. Neck supple. No JVD present. No thyromegaly present.  Cardiovascular: Normal rate, regular rhythm and normal heart sounds.  No murmur heard. trace BLE edema Pulmonary/Chest: Effort normal and breath sounds normal. No respiratory distress. no wheezes.  Musculoskeletal: R knee - boggy synovitis - tender to palpation over joint line; FROM and ligamentous function intact Neurological: he is alert and oriented to person, place, and time. No cranial nerve deficit. Coordination normal.  Psychiatric: he has a normal mood and affect. behavior is normal. Judgment and thought content normal.   Lab Results  Component Value Date   WBC 6.0 03/22/2011   HGB 14.0 03/22/2011   HCT 42.4 03/22/2011   PLT 154.0 03/22/2011   GLUCOSE 88 12/02/2011   CHOL 145 12/02/2011   TRIG 50.0 12/02/2011   HDL 50.20 12/02/2011   LDLCALC 85 12/02/2011   NA 142 12/02/2011   K 3.9 12/02/2011  CL 108 12/02/2011   CREATININE 1.1 12/02/2011   BUN 17 12/02/2011   CO2 29 12/02/2011   PSA 1.38 03/22/2011   PSA 1.52 03/22/2011      Assessment & Plan:  See problem list. Medications and labs reviewed today.

## 2012-03-22 NOTE — Assessment & Plan Note (Signed)
01/11/12 and 9 26/13 OV reviewed: swelling and tightness/discomfort diffusely to leg to just above the right knee -  Resolved s/p doxy course, nsaid prn  Refer to orthopedic now as improved but not resolved and chronic OA issues Offered but declines steroid injection here today

## 2012-03-28 ENCOUNTER — Other Ambulatory Visit: Payer: Self-pay | Admitting: Internal Medicine

## 2012-03-28 NOTE — Telephone Encounter (Signed)
Faxed script back to [facility].../lmb 

## 2012-04-03 ENCOUNTER — Encounter: Payer: Self-pay | Admitting: Pulmonary Disease

## 2012-04-03 ENCOUNTER — Ambulatory Visit (INDEPENDENT_AMBULATORY_CARE_PROVIDER_SITE_OTHER): Payer: Medicare PPO | Admitting: Pulmonary Disease

## 2012-04-03 VITALS — BP 110/66 | HR 71 | Temp 98.3°F | Ht 70.0 in | Wt 252.0 lb

## 2012-04-03 DIAGNOSIS — G4733 Obstructive sleep apnea (adult) (pediatric): Secondary | ICD-10-CM

## 2012-04-03 NOTE — Progress Notes (Signed)
  Subjective:    Patient ID: Eugene [name], male    DOB: Jun 28, 1945, 66 y.o.   MRN: [id number]  HPI The patient comes in today for followup of his known obstructive sleep apnea.  He is wearing CPAP compliantly, and has no issues with mask fit or pressure.  He feels he sleeps well with the device, and has adequate daytime alertness.  His weight is stable since the last visit    Review of Systems  Constitutional: Negative for fever and unexpected weight change.  HENT: Negative for ear pain, nosebleeds, congestion, sore throat, rhinorrhea, sneezing, trouble swallowing, dental problem, postnasal drip and sinus pressure.   Eyes: Negative for redness and itching.  Respiratory: Negative for cough, chest tightness, shortness of breath and wheezing.   Cardiovascular: Negative for palpitations and leg swelling.  Gastrointestinal: Negative for nausea and vomiting.  Genitourinary: Negative for dysuria.  Musculoskeletal: Negative for joint swelling.  Skin: Negative for rash.  Neurological: Negative for headaches.  Hematological: Does not bruise/bleed easily.  Psychiatric/Behavioral: Negative for dysphoric mood. The patient is not nervous/anxious.        Objective:   Physical Exam Obese male in no acute distress Nose without purulence or discharge noted No skin breakdown or pressure necrosis from the CPAP mask Lower extremities with mild edema, no cyanosis Alert and oriented, moves all 4 extremities.       Assessment & Plan:

## 2012-04-03 NOTE — Assessment & Plan Note (Signed)
The patient is doing well with CPAP currently, and feels that it helps his sleep and daytime alertness.  He is having no issues with mask fit or pressure, but does have some slippage of his mask at times because of oily skin.  He has developed a work around for this.  I have also reminded him to keep up with that cushion changes, and encouraged him to work aggressively on weight loss.

## 2012-04-03 NOTE — Patient Instructions (Addendum)
Continue on cpap, and keep up with cushion changes on your mask Work on weight loss followup with me in one year.

## 2012-04-13 ENCOUNTER — Encounter: Payer: Self-pay | Admitting: Pulmonary Disease

## 2012-05-04 ENCOUNTER — Other Ambulatory Visit: Payer: Self-pay | Admitting: Internal Medicine

## 2012-05-04 NOTE — Telephone Encounter (Signed)
Faxed script back to [facility].../lmb 

## 2012-05-28 ENCOUNTER — Other Ambulatory Visit: Payer: Self-pay | Admitting: Internal Medicine

## 2012-06-01 ENCOUNTER — Ambulatory Visit (INDEPENDENT_AMBULATORY_CARE_PROVIDER_SITE_OTHER): Payer: Medicare PPO | Admitting: Internal Medicine

## 2012-06-01 ENCOUNTER — Encounter: Payer: Self-pay | Admitting: Internal Medicine

## 2012-06-01 VITALS — BP 118/72 | HR 66 | Temp 99.7°F | Resp 12 | Wt 251.4 lb

## 2012-06-01 DIAGNOSIS — E669 Obesity, unspecified: Secondary | ICD-10-CM

## 2012-06-01 DIAGNOSIS — I10 Essential (primary) hypertension: Secondary | ICD-10-CM

## 2012-06-01 DIAGNOSIS — M1990 Unspecified osteoarthritis, unspecified site: Secondary | ICD-10-CM

## 2012-06-01 DIAGNOSIS — G4733 Obstructive sleep apnea (adult) (pediatric): Secondary | ICD-10-CM

## 2012-06-01 NOTE — Progress Notes (Signed)
Subjective:    Patient ID: Eugene [name], male    DOB: [birth year], 67 y.o.   MRN: [id number]  HPI  Here for follow up - reviewed chronic medical issues:  allergic rhinitis - chronic but seasonally worse - has seen ENT with 03/2011 CT - s/p eval by allergist 09/2011 - only using flonase due to PA problems with astelin  hypertension - the patient reports compliance with medication(s) as prescribed. Denies adverse side effects.  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 follows with cardiology for same every 6-12 months. No edema, shortness of breath or other symptoms. Last TEE 07/2011 stable  OSA. hx severe disease when diagnosed in 2000. On CPAP 2000- 2005, resumed auto titration CPAP March 2013 at direction of pulmonary - reports continued problems with mask and sleep, working with [facility] provider and pulm on same  osteoarthritis - B knees - also shoulders - uses NSAIDs for same prn - topical and oral, but voltaren gel not covered - no swelling - no limited ROM, no injury/trauma - no weakness or numbness - episode RLE/knee swelling 12/2011 -s/p ortho eval and steroid injection with compression - reports improved but still "tight" in knee with climbing - currently, no knee pain or recurrent swelling  Past Medical History  Diagnosis Date  . Hypertension   . Rheumatic fever     residual pulm stenosis, follows with ganji every 73mo  . Anxiety   . Allergic rhinitis due to pollen   . ED (erectile dysfunction)   . OBESITY   . Osteoarthritis   . RHINITIS, CHRONIC   . SLEEP APNEA, OBSTRUCTIVE dx 2000    NPSG 2000:  AHI 76/hr CPAP titrated to 11cm 2002   . Pulmonary stenosis, valvar     TEE 07/2011: stable, LVEF 55%, small PFO  . Refusal of blood transfusions as patient is Jehovah's Witness     Review of Systems  Constitutional: Negative for fever, fatigue and unexpected weight change.  Respiratory: Negative for cough and shortness of breath.     Cardiovascular: Negative for chest pain, palpitations and leg swelling.  Gastrointestinal: Negative for abdominal pain and blood in stool.       Objective:   Physical Exam  BP 118/72  Pulse 66  Temp 99.7 F (37.6 C) (Oral)  Resp 12  Wt 251 lb 6.4 oz (114.034 kg)  SpO2 98% Wt Readings from Last 3 Encounters:  06/01/12 251 lb 6.4 oz (114.034 kg)  04/03/12 252 lb (114.306 kg)  03/22/12 249 lb (112.946 kg)   Constitutional:  He is overweight, but appears well-developed and well-nourished. No distress.  Neck: Thick. Normal range of motion. Neck supple. No JVD present. No thyromegaly present.  Cardiovascular: Normal rate, regular rhythm and normal heart sounds.  No murmur heard. trace BLE edema Pulmonary/Chest: Effort normal and breath sounds normal. No respiratory distress. no wheezes.  Musculoskeletal: R knee - boggy synovitis - min tender to palpation over joint line; FROM and ligamentous function intact Neurological: he is alert and oriented to person, place, and time. No cranial nerve deficit. Coordination normal.  Psychiatric: he has a normal mood and affect. behavior is normal. Judgment and thought content normal.   Lab Results  Component Value Date   WBC 6.0 03/22/2011   HGB 14.0 03/22/2011   HCT 42.4 03/22/2011   PLT 154.0 03/22/2011   GLUCOSE 88 12/02/2011   CHOL 145 12/02/2011   TRIG 50.0 12/02/2011   HDL 50.20 12/02/2011  LDLCALC 85 12/02/2011   NA 142 12/02/2011   K 3.9 12/02/2011   CL 108 12/02/2011   CREATININE 1.1 12/02/2011   BUN 17 12/02/2011   CO2 29 12/02/2011   PSA 1.38 03/22/2011   PSA 1.52 03/22/2011      Assessment & Plan:   See problem list. Medications and labs reviewed today.  Time spent with pt today 25 minutes, greater than 50% time spent counseling patient on weight, hypertension, osteoarthritis and medication review. Also review of interval records

## 2012-06-01 NOTE — Assessment & Plan Note (Signed)
BP Readings from Last 3 Encounters:  06/01/12 118/72  04/03/12 110/66  03/22/12 112/62   Med changes late September 2013 reviewed - blood pressure improved The current medical regimen is effective;  continue present plan and medications.

## 2012-06-01 NOTE — Assessment & Plan Note (Signed)
Regular NSAID use for chronic symptoms in B knees and hands history of R shoulder tendonitis with overuse, improved s/p steroid injection- Resumed generic Relafen 01/2012 but reminded not to take at same time as otc Aleve s/p ortho eval summer 2013 Dion Saucier) with steroid injection R knee - improved

## 2012-06-01 NOTE — Assessment & Plan Note (Signed)
Working with pulm for same - on CPAP and improved symptoms The current medical regimen is effective;  continue present plan and medications. 

## 2012-06-01 NOTE — Assessment & Plan Note (Signed)
Wt Readings from Last 3 Encounters:  06/01/12 251 lb 6.4 oz (114.034 kg)  04/03/12 252 lb (114.306 kg)  03/22/12 249 lb (112.946 kg)   The patient is asked to make an attempt to improve diet and exercise patterns to aid in medical management of this problem.  Losing weight will also help OA, OSA and hypertension mgmt

## 2012-06-01 NOTE — Patient Instructions (Signed)
It was good to see you today. We have reviewed your prior records including labs and tests today Medications reviewed and updated, no changes at this time. Continue to work with your other medical specialists as ongoing Please schedule followup in 4-6  Months for physical and labs, call sooner if problems.

## 2012-06-25 ENCOUNTER — Other Ambulatory Visit: Payer: Self-pay | Admitting: Internal Medicine

## 2012-06-26 NOTE — Telephone Encounter (Signed)
Faxed script back to [facility].../lmb 

## 2012-08-20 ENCOUNTER — Other Ambulatory Visit: Payer: Self-pay | Admitting: Internal Medicine

## 2012-08-20 ENCOUNTER — Encounter: Payer: Self-pay | Admitting: Internal Medicine

## 2012-08-20 ENCOUNTER — Ambulatory Visit (INDEPENDENT_AMBULATORY_CARE_PROVIDER_SITE_OTHER): Payer: Medicare PPO | Admitting: Internal Medicine

## 2012-08-20 VITALS — BP 130/80 | HR 68 | Ht 66.25 in | Wt 253.2 lb

## 2012-08-20 DIAGNOSIS — J3089 Other allergic rhinitis: Secondary | ICD-10-CM

## 2012-08-20 DIAGNOSIS — J309 Allergic rhinitis, unspecified: Secondary | ICD-10-CM

## 2012-08-20 NOTE — Patient Instructions (Addendum)
Ok to skip Astelin antihistamine nasal spray unless needed, to reduce dryness in your nose.  Look at drug store for a nasal saline gel   You can use this as often as needed to reduce dryness

## 2012-08-20 NOTE — Progress Notes (Signed)
09/15/11- 65 yoM never smoker self referred for allergy evaluation. Former patient at [facility]. Now followed here by Dr Shelle Iron for OSA. Old records being obtained. He complains that for 4 or 5 months he has had numbness in the right side of his face "like at dentist". Right eye burns and walkers. The vision is okay and hearing okay. No discomfort with his teeth. This sensation has been persistent without change. Dr Thayer Ohm Newman/ ENT did CT scan of sinuses on 04/01/2011 describing mild mucosal thickening with no air-fluid levels. 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 No mass. He says nasal congestion is such that he has to mouth breathe but congestion shifts back and forth. He denies ENT trauma or surgery. Has been taking Astelin and fluticasone nasal sprays with little benefit. 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  10/20/11- 65 yoM never smoker self referred for allergy evaluation. Former patient at [facility]. Now followed here by Dr Shelle Iron for OSA. At last visit we gave him a nasal nebulizer treatment and sample of Dymista, which seemed to help. When the sample ran out, he went back to [employer] which doesn't work as well. He is just using Astelin now. Last week he had a viral chest cold with hard cough since then has had a dull persistent substernal ache increased by reaching or lifting with his arms. Still has some productive cough with white sputum. Some wheeze.  02/20/12- 65 yoM never smoker followed by [facility] for allergy . Former patient at [facility]. Now followed here by Dr Shelle Iron for OSA.  last Thursday had sinus headache and could not get rid of; overall has good and bad days; congestion at times. He had been outdoors all day and the next day developed persistent frontal headache. Headache is now clear but he has residual nasal congestion. Dymista nasal spray has been some help. He asks about chronic coated tongue despite rush but with  his toothbrush and using mouthwash. He is not diabetic. Aware of postnasal drip.  08/20/12- 66 yoM never smoker followed by [facility] for allergic rhinitis . Former patient at [facility]. Now followed here by Dr Shelle Iron for OSA. FOLLOWS FOR: bloody nasal drainage for about past 2 months-comes and goes(mornings are normally the worst time for patient). Using Astelin and loratadine potential for overdrying. Tongue is coated. Remains on CPAP with humidifier, supervised by Dr. Shelle Iron. Continues allergy vaccine 1:10 GO  ROS-see HPI Constitutional:   No-   weight loss, night sweats, fevers, chills, fatigue, lassitude. HEENT:   No-  headaches, difficulty swallowing, tooth/dental problems, sore throat,       +  sneezing, itching, ear ache, +nasal congestion, +post nasal drip,  CV:  No-   chest pain, orthopnea, PND, swelling in lower extremities, anasarca, dizziness, palpitations Resp: No-   shortness of breath with exertion or at rest.              No-   productive cough,  No non-productive cough,  No- coughing up of blood.              No-   change in color of mucus.  No- wheezing.   Skin: No-   rash or lesions. GI:  No-   heartburn, indigestion, abdominal pain, nausea, vomiting,  GU: No-   dysuria,  MS:  No-   joint pain or swelling.   Neuro-     nothing unusual Psych:  No- change in mood or affect. No  depression or anxiety.  No memory loss.  OBJ- Physical Exam General- Alert, Oriented, Affect-appropriate, Distress- none acute.  Skin- rash-none, lesions- none, excoriation- none Lymphadenopathy- none Head- atraumatic            Eyes- Gross vision intact, PERRLA, conjunctivae and secretions clear            Ears- Hearing, canals-normal            Nose- +Superficial blood with crusting right turbinates, no-Septal dev, mucus, polyps, erosion, perforation             Throat- Mallampati III-IV , + tongue coated , drainage- none, tonsils- atrophic Neck- flexible , trachea midline, no stridor , thyroid nl,  carotid no bruit Chest - symmetrical excursion , unlabored           Heart/CV- RRR , no murmur , no gallop  , no rub, nl s1 s2                           - JVD- none , edema- none, stasis changes- none, varices- none           Lung- clear to P&A, no-wheeze, cough- none , dullness-none, rub- none           Chest wall-  Abd-  Br/ Gen/ Rectal- Not done, not indicated Extrem- cyanosis- none, clubbing, none, atrophy- none, strength- nl Neuro- grossly intact to observation. Neg Chvostek's. No facial asymmetry

## 2012-08-20 NOTE — Telephone Encounter (Signed)
Faxed script back to [facility].../lmb 

## 2012-08-27 ENCOUNTER — Encounter: Payer: Self-pay | Admitting: Internal Medicine

## 2012-08-27 NOTE — Assessment & Plan Note (Signed)
Recent epistaxis may be from overdrying. Potentially might need ENT referral for cauterization. Plan-reduce use of Astelin nasal spray. Try nasal saline gel. Okay to continue allergy vaccine.

## 2012-08-31 ENCOUNTER — Other Ambulatory Visit: Payer: Self-pay | Admitting: *Deleted

## 2012-08-31 DIAGNOSIS — I10 Essential (primary) hypertension: Secondary | ICD-10-CM

## 2012-08-31 MED ORDER — LOSARTAN POTASSIUM 50 MG PO TABS: 50.0000 mg | ORAL_TABLET | Freq: Two times a day (BID) | ORAL | Status: DC

## 2012-08-31 NOTE — Telephone Encounter (Signed)
R'cd fax from [facility] for refill of Losartan.

## 2012-09-03 ENCOUNTER — Encounter: Payer: Self-pay | Admitting: *Deleted

## 2012-09-18 ENCOUNTER — Ambulatory Visit (INDEPENDENT_AMBULATORY_CARE_PROVIDER_SITE_OTHER): Payer: Medicare PPO | Admitting: Internal Medicine

## 2012-09-18 ENCOUNTER — Other Ambulatory Visit (INDEPENDENT_AMBULATORY_CARE_PROVIDER_SITE_OTHER): Payer: Medicare PPO

## 2012-09-18 ENCOUNTER — Encounter: Payer: Self-pay | Admitting: Internal Medicine

## 2012-09-18 VITALS — BP 132/80 | HR 59 | Temp 98.2°F | Ht 69.5 in | Wt 250.0 lb

## 2012-09-18 DIAGNOSIS — R5383 Other fatigue: Secondary | ICD-10-CM

## 2012-09-18 DIAGNOSIS — Z0000 Encounter for general adult medical examination without abnormal findings: Secondary | ICD-10-CM

## 2012-09-18 DIAGNOSIS — I452 Bifascicular block: Secondary | ICD-10-CM | POA: Insufficient documentation

## 2012-09-18 DIAGNOSIS — I10 Essential (primary) hypertension: Secondary | ICD-10-CM

## 2012-09-18 DIAGNOSIS — N529 Male erectile dysfunction, unspecified: Secondary | ICD-10-CM

## 2012-09-18 DIAGNOSIS — Z23 Encounter for immunization: Secondary | ICD-10-CM

## 2012-09-18 LAB — CBC WITH DIFFERENTIAL/PLATELET
Basophils Relative: 0.5 % (ref 0.0–3.0)
Eosinophils Absolute: 0.2 10*3/uL (ref 0.0–0.7)
Hemoglobin: 14 g/dL (ref 13.0–17.0)
Lymphocytes Relative: 34.1 % (ref 12.0–46.0)
MCHC: 33.8 g/dL (ref 30.0–36.0)
Monocytes Relative: 8.3 % (ref 3.0–12.0)
Neutro Abs: 3.8 10*3/uL (ref 1.4–7.7)
Neutrophils Relative %: 54.5 % (ref 43.0–77.0)
RBC: 4.21 Mil/uL — ABNORMAL LOW (ref 4.22–5.81)
WBC: 7 10*3/uL (ref 4.5–10.5)

## 2012-09-18 LAB — LIPID PANEL
Cholesterol: 151 mg/dL (ref 0–200)
HDL: 47 mg/dL (ref >39.00)
Total CHOL/HDL Ratio: 3
Triglycerides: 57 mg/dL (ref 0.0–149.0)

## 2012-09-18 LAB — HEPATIC FUNCTION PANEL
ALT: 25 U/L (ref 0–53)
AST: 33 U/L (ref 0–37)
Albumin: 4.1 g/dL (ref 3.5–5.2)
Alkaline Phosphatase: 71 U/L (ref 39–117)
Total Protein: 6.7 g/dL (ref 6.0–8.3)

## 2012-09-18 LAB — BASIC METABOLIC PANEL
CO2: 28 mEq/L (ref 19–32)
Calcium: 8.9 mg/dL (ref 8.4–10.5)
Creatinine, Ser: 1.2 mg/dL (ref 0.4–1.5)
GFR: 79.25 mL/min (ref >60.00)
Sodium: 139 mEq/L (ref 135–145)

## 2012-09-18 NOTE — Progress Notes (Signed)
Subjective:    Patient ID: Eugene [name], male    DOB: November 17, 1945, 67 y.o.   MRN: [id number]  HPI   Here for medicare wellness  Diet: heart healthy  Physical activity: sedentary Depression/mood screen: negative Hearing: intact to whispered voice Visual acuity: grossly normal, performs annual eye exam  ADLs: capable Fall risk: none Home safety: good Cognitive evaluation: intact to orientation, naming, recall and repetition EOL planning: adv directives, full code/ I agree  I have personally reviewed and have noted 1. The patient's medical and social history 2. Their use of alcohol, tobacco or illicit drugs 3. Their current medications and supplements 4. The patient's functional ability including ADL's, fall risks, home safety risks and hearing or visual impairment. 5. Diet and physical activities 6. Evidence for depression or mood disorders  Also reviewed chronic medical issues:  allergic rhinitis - chronic with seasonal flares - has seen ENT with 03/2011 CT - s/p eval by allergist 09/2011 - only using flonase due to PA problems with astelin  hypertension - the patient reports compliance with medication(s) as prescribed. Denies adverse side effects.  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 follows with cardiology for same every 6-12 months. No edema, shortness of breath or other symptoms. Last TEE 07/2011 stable  OSA. hx severe disease when diagnosed in 2000. On CPAP 2000- 2005, resumed auto titration CPAP March 2013 at direction of pulmonary - reports continued problems with mask and sleep, working with [facility] provider and pulm on same  osteoarthritis - B knees - also shoulders - uses NSAIDs for same prn - topical and oral, but voltaren gel not covered - no swelling - no limited ROM, no injury/trauma - no weakness or numbness - episode RLE/knee swelling 12/2011 -s/p ortho eval and steroid injection with compression - reports improved but  still "tight" in knee with climbing - currently, no knee pain or recurrent swelling  Past Medical History  Diagnosis Date  . Hypertension   . Rheumatic fever     residual pulm stenosis, follows with ganji every 50mo  . Anxiety   . Allergic rhinitis due to pollen   . ED (erectile dysfunction)   . OBESITY   . Osteoarthritis   . RHINITIS, CHRONIC   . SLEEP APNEA, OBSTRUCTIVE dx 2000    NPSG 2000:  AHI 76/hr CPAP titrated to 11cm 2002   . Pulmonary stenosis, valvar     TEE 07/2011: stable, LVEF 55%, small PFO  . Refusal of blood transfusions as patient is Jehovah's Witness    Family History  Problem Relation Age of Onset  . Heart disease Mother   . Heart disease Father   . Hypertension Other    History  Substance Use Topics  . Smoking status: Never Smoker   . Smokeless tobacco: Not on file  . Alcohol Use: 0.0 oz/week    0 Glasses of wine per week    Review of Systems  Constitutional: Positive for fatigue. Negative for fever and unexpected weight change.  Respiratory: Negative for cough and shortness of breath.   Cardiovascular: Negative for chest pain, palpitations and leg swelling.  Gastrointestinal: Negative for abdominal pain and blood in stool.  Neurological: Negative for dizziness or headache.  No other specific complaints in a complete review of systems (except as listed in HPI above).      Objective:   Physical Exam  BP 132/80  Pulse 59  Temp(Src) 98.2 F (36.8 C) (Oral)  Ht 5' 9.5" (  1.765 m)  Wt 250 lb (113.399 kg)  BMI 36.4 kg/m2  SpO2 96% Wt Readings from Last 3 Encounters:  09/18/12 250 lb (113.399 kg)  08/20/12 253 lb 3.2 oz (114.851 kg)  06/01/12 251 lb 6.4 oz (114.034 kg)   Constitutional: he is overweight, but appears well-developed and well-nourished. No distress.  HENT: Head: Normocephalic and atraumatic. Ears: B TMs ok, no erythema or effusion; Nose: Nose normal. Mouth/Throat: Oropharynx is clear and moist. No oropharyngeal exudate.  Eyes:  Conjunctivae and EOM are normal. Pupils are equal, round, and reactive to light. No scleral icterus.  Neck: Thick. Normal range of motion. Neck supple. No JVD or LAD present. No thyromegaly present. no bruits Cardiovascular: Normal rate, regular rhythm and normal heart sounds.  No murmur heard. No BLE edema. Pulmonary/Chest: Effort normal and breath sounds normal. No respiratory distress. he has no wheezes.  Abdominal: Soft. Bowel sounds are normal. he exhibits no distension. There is no tenderness. no masses Musculoskeletal: R knee - boggy synovitis - tender to palpation over joint line; FROM and ligamentous function intact. No gross deformities Neurological: he is alert and oriented to person, place, and time. No cranial nerve deficit. Coordination, balance, strength, speech and gait are normal.  Skin: Skin is warm and dry. No rash noted. No erythema.  Psychiatric: he has a normal mood and affect. behavior is normal. Judgment and thought content normal.   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 Immunizations reviewed and up to date or declined. Labs/ECG reviewed. Risk factors for depression reviewed and negative. Hearing function and visual acuity are intact. ADLs screened and  addressed as needed. Functional ability and level of safety reviewed and appropriate. Education, counseling and referrals performed based on assessed risks today. Patient provided with a copy of personalized plan for preventive services.  Fatigue - nonspecific symptoms/exam - check screening labs  Also see problem list. Medications and labs reviewed today.

## 2012-09-18 NOTE — Assessment & Plan Note (Signed)
BP Readings from Last 3 Encounters:  09/18/12 132/80  08/20/12 130/80  06/01/12 118/72   Med changes late September 2013 reviewed - blood pressure overall improved The current medical regimen is effective;  continue present plan and medications.

## 2012-09-18 NOTE — Patient Instructions (Signed)
It was good to see you today. We have reviewed your prior records including labs and tests today Health Maintenance reviewed - pneumonia vaccine updated today - all other recommended immunizations and age-appropriate screenings are up-to-date. Test(s) ordered today. Your results will be released to MyChart (or called to you) after review, usually within 72hours after test completion. If any changes need to be made, you will be notified at that same time. Medications reviewed and updated, no changes recommended at this time. Work on lifestyle changes as discussed (low fat, low carb, increased protein diet; improved exercise efforts; weight loss) to control sugar, blood pressure and cholesterol levels and/or reduce risk of developing other medical problems. Look into [email or web address] or other type of food journal to assist you in this process. Please schedule followup in 6 months for blood pressure and weight check, call sooner if problems.  Health Maintenance, Males A healthy lifestyle and preventative care can promote health and wellness.  Maintain regular health, dental, and eye exams.  Eat a healthy diet. Foods like vegetables, fruits, whole grains, low-fat dairy products, and lean protein foods contain the nutrients you need without too many calories. Decrease your intake of foods high in solid fats, added sugars, and salt. Get information about a proper diet from your caregiver, if necessary.  Regular physical exercise is one of the most important things you can do for your health. Most adults should get at least 150 minutes of moderate-intensity exercise (any activity that increases your heart rate and causes you to sweat) each week. In addition, most adults need muscle-strengthening exercises on 2 or more days a week.   Maintain a healthy weight. The body mass index (BMI) is a screening tool to identify possible weight problems. It provides an estimate of body fat based on height and weight.  Your caregiver can help determine your BMI, and can help you achieve or maintain a healthy weight. For adults 20 years and older:  A BMI below 18.5 is considered underweight.  A BMI of 18.5 to 24.9 is normal.  A BMI of 25 to 29.9 is considered overweight.  A BMI of 30 and above is considered obese.  Maintain normal blood lipids and cholesterol by exercising and minimizing your intake of saturated fat. Eat a balanced diet with plenty of fruits and vegetables. Blood tests for lipids and cholesterol should begin at age 64 and be repeated every 5 years. If your lipid or cholesterol levels are high, you are over 50, or you are a high risk for heart disease, you may need your cholesterol levels checked more frequently.Ongoing high lipid and cholesterol levels should be treated with medicines, if diet and exercise are not effective.  If you smoke, find out from your caregiver how to quit. If you do not use tobacco, do not start.  If you choose to drink alcohol, do not exceed 2 drinks per day. One drink is considered to be 12 ounces (355 mL) of beer, 5 ounces (148 mL) of wine, or 1.5 ounces (44 mL) of liquor.  Avoid use of street drugs. Do not share needles with anyone. Ask for help if you need support or instructions about stopping the use of drugs.  High blood pressure causes heart disease and increases the risk of stroke. Blood pressure should be checked at least every 1 to 2 years. Ongoing high blood pressure should be treated with medicines if weight loss and exercise are not effective.  If you are 7 to 67 years old, ask  your caregiver if you should take aspirin to prevent heart disease.  Diabetes screening involves taking a blood sample to check your fasting blood sugar level. This should be done once every 3 years, after age 37, if you are within normal weight and without risk factors for diabetes. Testing should be considered at a younger age or be carried out more frequently if you are  overweight and have at least 1 risk factor for diabetes.  Colorectal cancer can be detected and often prevented. Most routine colorectal cancer screening begins at the age of 8 and continues through age 32. However, your caregiver may recommend screening at an earlier age if you have risk factors for colon cancer. On a yearly basis, your caregiver may provide home test kits to check for hidden blood in the stool. Use of a small camera at the end of a tube, to directly examine the colon (sigmoidoscopy or colonoscopy), can detect the earliest forms of colorectal cancer. Talk to your caregiver about this at age 46, when routine screening begins. Direct examination of the colon should be repeated every 5 to 10 years through age 65, unless early forms of pre-cancerous polyps or small growths are found.  Hepatitis C blood testing is recommended for all people born from [birth year] through 1965 and any individual with known risks for hepatitis C.  Healthy men should no longer receive prostate-specific antigen (PSA) blood tests as part of routine cancer screening. Consult with your caregiver about prostate cancer screening.  Testicular cancer screening is not recommended for adolescents or adult males who have no symptoms. Screening includes self-exam, caregiver exam, and other screening tests. Consult with your caregiver about any symptoms you have or any concerns you have about testicular cancer.  Practice safe sex. Use condoms and avoid high-risk sexual practices to reduce the spread of sexually transmitted infections (STIs).  Use sunscreen with a sun protection factor (SPF) of 30 or greater. Apply sunscreen liberally and repeatedly throughout the day. You should seek shade when your shadow is shorter than you. Protect yourself by wearing long sleeves, pants, a wide-brimmed hat, and sunglasses year round, whenever you are outdoors.  Notify your caregiver of new moles or changes in moles, especially if there is a  change in shape or color. Also notify your caregiver if a mole is larger than the size of a pencil eraser.  A one-time screening for abdominal aortic aneurysm (AAA) and surgical repair of large AAAs by sound wave imaging (ultrasonography) is recommended for ages 70 to 23 years who are current or former smokers.  Stay current with your immunizations. Document Released: 10/29/2007 Document Revised: 07/25/2011 Document Reviewed: 09/27/2010 [facility] Patient Information 2013 [place], [place]. Exercise to Lose Weight Exercise and a healthy diet may help you lose weight. Your doctor may suggest specific exercises. EXERCISE IDEAS AND TIPS  Choose low-cost things you enjoy doing, such as walking, bicycling, or exercising to workout videos.  Take stairs instead of the elevator.  Walk during your lunch break.  Park your car further away from work or school.  Go to a gym or an exercise class.  Start with 5 to 10 minutes of exercise each day. Build up to 30 minutes of exercise 4 to 6 days a week.  Wear shoes with good support and comfortable clothes.  Stretch before and after working out.  Work out until you breathe harder and your heart beats faster.  Drink extra water when you exercise.  Do not do so much that you  hurt yourself, feel dizzy, or get very short of breath. Exercises that burn about 150 calories:  Running 1  miles in 15 minutes.  Playing volleyball for 45 to 60 minutes.  Washing and waxing a car for 45 to 60 minutes.  Playing touch football for 45 minutes.  Walking 1  miles in 35 minutes.  Pushing a stroller 1  miles in 30 minutes.  Playing basketball for 30 minutes.  Raking leaves for 30 minutes.  Bicycling 5 miles in 30 minutes.  Walking 2 miles in 30 minutes.  Dancing for 30 minutes.  Shoveling snow for 15 minutes.  Swimming laps for 20 minutes.  Walking up stairs for 15 minutes.  Bicycling 4 miles in 15 minutes.  Gardening for 30 to 45  minutes.  Jumping rope for 15 minutes.  Washing windows or floors for 45 to 60 minutes. Document Released: 06/04/2010 Document Revised: 07/25/2011 Document Reviewed: 06/04/2010 [facility] Patient Information 2013 [place], [place].

## 2012-09-18 NOTE — Assessment & Plan Note (Signed)
Hx mildly low testosterone 2012 (331) Discussion on uncertainty around recommended levels for age - will defer recheck at this time and continue Viagra prn Advised against OTC testosterone supplement  Lab Results  Component Value Date   TESTOSTERONE 331.67* 03/22/2011

## 2012-09-20 ENCOUNTER — Other Ambulatory Visit: Payer: Self-pay | Admitting: Internal Medicine

## 2012-11-16 ENCOUNTER — Other Ambulatory Visit: Payer: Self-pay | Admitting: Internal Medicine

## 2012-12-07 ENCOUNTER — Other Ambulatory Visit: Payer: Self-pay | Admitting: *Deleted

## 2012-12-07 MED ORDER — CLONAZEPAM 1 MG PO TABS: 1.0000 mg | ORAL_TABLET | Freq: Every evening | ORAL | Status: DC | PRN

## 2012-12-07 NOTE — Telephone Encounter (Signed)
Faxed script back to [facility].../lmb 

## 2012-12-10 ENCOUNTER — Other Ambulatory Visit: Payer: Self-pay | Admitting: *Deleted

## 2012-12-10 MED ORDER — CLONAZEPAM 1 MG PO TABS: 1.0000 mg | ORAL_TABLET | Freq: Every evening | ORAL | Status: DC | PRN

## 2012-12-10 NOTE — Telephone Encounter (Signed)
Faxed script back to [facility].../lmb 

## 2012-12-11 ENCOUNTER — Other Ambulatory Visit: Payer: Self-pay | Admitting: *Deleted

## 2012-12-11 MED ORDER — NABUMETONE 750 MG PO TABS: ORAL_TABLET | ORAL | Status: DC

## 2013-01-23 ENCOUNTER — Other Ambulatory Visit: Payer: Self-pay | Admitting: *Deleted

## 2013-01-23 MED ORDER — AMLODIPINE BESYLATE 5 MG PO TABS: 5.0000 mg | ORAL_TABLET | Freq: Every day | ORAL | Status: DC

## 2013-02-18 ENCOUNTER — Encounter: Payer: Self-pay | Admitting: Internal Medicine

## 2013-02-18 ENCOUNTER — Ambulatory Visit (INDEPENDENT_AMBULATORY_CARE_PROVIDER_SITE_OTHER): Payer: Commercial Managed Care - HMO | Admitting: Internal Medicine

## 2013-02-18 VITALS — BP 142/86 | HR 76 | Ht 66.0 in | Wt 248.4 lb

## 2013-02-18 DIAGNOSIS — J342 Deviated nasal septum: Secondary | ICD-10-CM

## 2013-02-18 DIAGNOSIS — J309 Allergic rhinitis, unspecified: Secondary | ICD-10-CM

## 2013-02-18 DIAGNOSIS — J302 Other seasonal allergic rhinitis: Secondary | ICD-10-CM

## 2013-02-18 NOTE — Assessment & Plan Note (Signed)
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 At least mild septal deviation. The sense of nasal stuffiness is an issue to him.  Plan- refer for another ENT opinion. Try nasal strips. Ok to continue allergy vaccine.

## 2013-02-18 NOTE — Progress Notes (Signed)
09/15/11- 65 yoM never smoker self referred for allergy evaluation. Former patient at [facility]. Now followed here by Dr Shelle Iron for OSA. Old records being obtained. He complains that for 4 or 5 months he has had numbness in the right side of his face "like at dentist". Right eye burns and walkers. The vision is okay and hearing okay. No discomfort with his teeth. This sensation has been persistent without change. Dr Thayer Ohm Newman/ ENT did CT scan of sinuses on 04/01/2011 describing mild mucosal thickening with no air-fluid levels. 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 No mass. He says nasal congestion is such that he has to mouth breathe but congestion shifts back and forth. He denies ENT trauma or surgery. Has been taking Astelin and fluticasone nasal sprays with little benefit. 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  10/20/11- 65 yoM never smoker self referred for allergy evaluation. Former patient at [facility]. Now followed here by Dr Shelle Iron for OSA. At last visit we gave him a nasal nebulizer treatment and sample of Dymista, which seemed to help. When the sample ran out, he went back to [employer] which doesn't work as well. He is just using Astelin now. Last week he had a viral chest cold with hard cough since then has had a dull persistent substernal ache increased by reaching or lifting with his arms. Still has some productive cough with white sputum. Some wheeze.  02/20/12- 65 yoM never smoker followed by [facility] for allergy . Former patient at [facility]. Now followed here by Dr Shelle Iron for OSA.  last Thursday had sinus headache and could not get rid of; overall has good and bad days; congestion at times. He had been outdoors all day and the next day developed persistent frontal headache. Headache is now clear but he has residual nasal congestion. Dymista nasal spray has been some help. He asks about chronic coated tongue despite rush but with  his toothbrush and using mouthwash. He is not diabetic. Aware of postnasal drip.  08/20/12- 66 yoM never smoker followed by [facility] for allergic rhinitis . Former patient at [facility]. Now followed here by Dr Shelle Iron for OSA. FOLLOWS FOR: bloody nasal drainage for about past 2 months-comes and goes(mornings are normally the worst time for patient). Using Astelin and loratadine potential for overdrying. Tongue is coated. Remains on CPAP with humidifier, supervised by Dr. Shelle Iron. Continues allergy vaccine 1:10 GO  02/18/13- 67 yoM never smoker followed by [facility] for allergic rhinitis . Former patient at [facility]. Now followed here by Dr Shelle Iron for OSA/ CPAP. FOLLOWS FOR: Has "clogged" days and others "feels clear" Continues allergy vaccine 1:10 GO Shifting nasal stuffiness usually worse on R side despite otc decongestants. Ears ok.  Known septal deviation and mild chronic sinusitis on CT maxfac 2012. Had unsatisfactory visit with Dr Ezzard Standing then- never heard back.  ROS-see HPI Constitutional:   No-   weight loss, night sweats, fevers, chills, fatigue, lassitude. HEENT:   No-  headaches, difficulty swallowing, tooth/dental problems, sore throat,       +  sneezing, itching, ear ache, +nasal congestion, +post nasal drip,  CV:  No-   chest pain, orthopnea, PND, swelling in lower extremities, anasarca, dizziness, palpitations Resp: No-   shortness of breath with exertion or at rest.              No-   productive cough,  No non-productive cough,  No- coughing up of blood.  No-   change in color of mucus.  No- wheezing.   Skin: No-   rash or lesions. GI:  No-   heartburn, indigestion, abdominal pain, nausea, vomiting,  GU:   MS:  No-   joint pain or swelling.   Neuro-     nothing unusual Psych:  No- change in mood or affect. No depression or anxiety.  No memory loss.  OBJ- Physical Exam General- Alert, Oriented, Affect-appropriate, Distress- none acute.  Skin- rash-none, lesions- none, excoriation-  none Lymphadenopathy- none Head- atraumatic            Eyes- Gross vision intact, PERRLA, conjunctivae and secretions clear            Ears- Hearing, canals-normal            Nose-  +mild septal dev, No- mucus, polyps, erosion, perforation. No significant anterior                     obstruction             Throat- Mallampati III-IV , + tongue coated , drainage- none, tonsils- atrophic Neck- flexible , trachea midline, no stridor , thyroid nl, carotid no bruit Chest - symmetrical excursion , unlabored           Heart/CV- RRR , no murmur , no gallop  , no rub, nl s1 s2                           - JVD- none , edema- none, stasis changes- none, varices- none           Lung- clear to P&A, no-wheeze, cough- none , dullness-none, rub- none           Chest wall-  Abd-  Br/ Gen/ Rectal- Not done, not indicated Extrem- cyanosis- none, clubbing, none, atrophy- none, strength- nl Neuro- grossly intact to observation.

## 2013-02-18 NOTE — Patient Instructions (Addendum)
We can continue allergy vaccine 1:10 GH  Consider trying otc Breathe Right nasal strips for a few nights- see if you breathe easier through your nose wearing these  Order- referral [facility] ENT  Dx nasal septal deviation,

## 2013-03-25 ENCOUNTER — Ambulatory Visit (INDEPENDENT_AMBULATORY_CARE_PROVIDER_SITE_OTHER): Payer: Medicare PPO | Admitting: Internal Medicine

## 2013-03-25 ENCOUNTER — Encounter: Payer: Self-pay | Admitting: Internal Medicine

## 2013-03-25 VITALS — BP 140/70 | HR 72 | Temp 98.4°F | Wt 245.8 lb

## 2013-03-25 DIAGNOSIS — M1990 Unspecified osteoarthritis, unspecified site: Secondary | ICD-10-CM

## 2013-03-25 DIAGNOSIS — I10 Essential (primary) hypertension: Secondary | ICD-10-CM

## 2013-03-25 DIAGNOSIS — Z23 Encounter for immunization: Secondary | ICD-10-CM

## 2013-03-25 DIAGNOSIS — G4733 Obstructive sleep apnea (adult) (pediatric): Secondary | ICD-10-CM

## 2013-03-25 MED ORDER — LOSARTAN POTASSIUM-HCTZ 50-12.5 MG PO TABS: 1.0000 | ORAL_TABLET | Freq: Every day | ORAL | Status: DC

## 2013-03-25 NOTE — Assessment & Plan Note (Signed)
BP Readings from Last 3 Encounters:  03/25/13 140/70  02/18/13 142/86  09/18/12 132/80   Med changes late September 2013 reviewed - blood pressure overall improved The current medical regimen is effective;  continue present plan and medications.

## 2013-03-25 NOTE — Progress Notes (Signed)
Pre-visit discussion using our clinic review tool. No additional management support is needed unless otherwise documented below in the visit note.  

## 2013-03-25 NOTE — Patient Instructions (Signed)
It was good to see you today.  Your annual flu shot was given and/or updated today.  We have reviewed your prior records including labs and tests today  Medications reviewed and updated:change hyzar to 50/12.5 one tablet each morning instead of splitting 100/25 in half. Continue plain losartan at bedtime as ongoing - no other changes recommended at this time.  Talk with your [facility] nurse about coverage for shingles vaccination  Followup in 6 months for annual review and labs, please call sooner if problems

## 2013-03-25 NOTE — Assessment & Plan Note (Signed)
Working with pulm for same - on CPAP and improved symptoms The current medical regimen is effective;  continue present plan and medications. 

## 2013-03-25 NOTE — Assessment & Plan Note (Signed)
Regular NSAID use for chronic symptoms in B knees and hands history of R shoulder tendonitis with overuse, improved s/p steroid injection- Resumed generic Relafen 01/2012 but reminded not to take at same time as otc Aleve s/p ortho eval summer 2013 Dion Saucier) with steroid injection R knee - improved

## 2013-03-25 NOTE — Progress Notes (Signed)
Subjective:    Patient ID: Eugene [name], male    DOB: 11/22/1945, 67 y.o.   MRN: [id number]  HPI  Here for followup - reviewed chronic medical issues and interval medical events:  allergic rhinitis - chronic with seasonal flares - has seen ENT with 03/2011 CT - s/p eval by allergist 09/2011 - only using flonase due to PA problems with astelin  hypertension - the patient reports compliance with medication(s) as prescribed. Denies adverse side effects.  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 follows with cardiology for same every 6-12 months. No edema, shortness of breath or other symptoms. Last TEE 07/2011 stable  OSA. hx severe disease when diagnosed in 2000. On CPAP 2000- 2005, resumed auto titration CPAP March 2013 at direction of pulmonary - reports continued problems with mask and sleep, working with [facility] provider and pulm on same  osteoarthritis - B knees - also shoulders - uses NSAIDs for same prn - topical and oral, but voltaren gel not covered - no swelling - no limitation of ROM, no injury/trauma - no weakness or numbness - reviewed episode RLE/knee swelling 12/2011 -s/p ortho eval and steroid injection with compression- currently, no knee pain or recurrent swelling  Past Medical History  Diagnosis Date  . Hypertension   . Rheumatic fever     residual pulm stenosis, follows with [facility]ji every 59mo  . Anxiety   . Allergic rhinitis due to pollen   . ED (erectile dysfunction)   . OBESITY   . Osteoarthritis   . RHINITIS, CHRONIC   . SLEEP APNEA, OBSTRUCTIVE dx 2000    NPSG 2000:  AHI 76/hr CPAP titrated to 11cm 2002   . Pulmonary stenosis, valvar     TEE 07/2011: stable, LVEF 55%, small PFO  . Refusal of blood transfusions as patient is Jehovah's Witness     Review of Systems  Constitutional: Positive for fatigue. Negative for fever and unexpected weight change.  Respiratory: Negative for cough and shortness of breath.    Cardiovascular: Negative for chest pain, palpitations and leg swelling.  Gastrointestinal: Negative for abdominal pain and blood in stool.       Objective:   Physical Exam BP 140/70  Pulse 72  Temp(Src) 98.4 F (36.9 C) (Oral)  Wt 245 lb 12.8 oz (111.494 kg)  SpO2 97% Wt Readings from Last 3 Encounters:  03/25/13 245 lb 12.8 oz (111.494 kg)  02/18/13 248 lb 6.4 oz (112.674 kg)  09/18/12 250 lb (113.399 kg)   Constitutional: he is overweight, but appears well-developed and well-nourished. No distress.  Neck: Thick. Normal range of motion. Neck supple. No JVD or LAD present. No thyromegaly present. no bruits Cardiovascular: Normal rate, regular rhythm and normal heart sounds.  No murmur heard. No BLE edema. Pulmonary/Chest: Effort normal and breath sounds normal. No respiratory distress. he has no wheezes.  Musculoskeletal: R knee - boggy synovitis - tender to palpation over joint line; FROM and ligamentous function intact. No gross deformities Neurological: he is alert and oriented to person, place, and time. No cranial nerve deficit. Coordination, balance, strength, speech and gait are normal.  Skin: Skin is warm and dry. No rash noted. No erythema.  Psychiatric: he has a normal mood and affect. behavior is normal. Judgment and thought content normal.   Lab Results  Component Value Date   WBC 7.0 09/18/2012   HGB 14.0 09/18/2012   HCT 41.6 09/18/2012   PLT 139.0* 09/18/2012   GLUCOSE 99 09/18/2012  CHOL 151 09/18/2012   TRIG 57.0 09/18/2012   HDL 47.00 09/18/2012   LDLCALC 93 09/18/2012   ALT 25 09/18/2012   AST 33 09/18/2012   NA 139 09/18/2012   K 3.9 09/18/2012   CL 107 09/18/2012   CREATININE 1.2 09/18/2012   BUN 13 09/18/2012   CO2 28 09/18/2012   TSH 0.89 09/18/2012   PSA 1.73 09/18/2012         Assessment & Plan:   see problem list. Medications and labs reviewed today.

## 2013-03-27 ENCOUNTER — Other Ambulatory Visit: Payer: Self-pay | Admitting: *Deleted

## 2013-03-27 MED ORDER — CARVEDILOL 25 MG PO TABS: 25.0000 mg | ORAL_TABLET | Freq: Two times a day (BID) | ORAL | Status: DC

## 2013-04-02 ENCOUNTER — Encounter: Payer: Self-pay | Admitting: Pulmonary Disease

## 2013-04-02 ENCOUNTER — Encounter (INDEPENDENT_AMBULATORY_CARE_PROVIDER_SITE_OTHER): Payer: Self-pay

## 2013-04-02 ENCOUNTER — Ambulatory Visit: Payer: Medicare PPO | Admitting: Pulmonary Disease

## 2013-04-02 ENCOUNTER — Ambulatory Visit (INDEPENDENT_AMBULATORY_CARE_PROVIDER_SITE_OTHER): Payer: Medicare PPO | Admitting: Pulmonary Disease

## 2013-04-02 VITALS — BP 132/90 | HR 74 | Temp 98.5°F | Ht 69.0 in | Wt 247.6 lb

## 2013-04-02 DIAGNOSIS — G4733 Obstructive sleep apnea (adult) (pediatric): Secondary | ICD-10-CM

## 2013-04-02 NOTE — Assessment & Plan Note (Signed)
The patient overall is doing very well with CPAP.  He is having restorative sleep, and is satisfied with his daytime alertness.  He has lost 5 pounds since last visit, and I have encouraged him to continue.  He is having some minor mask leak issues at this time, and he is to call me if these continue so that I can arrange for formal fitting at the sleep Center.  Otherwise, we'll see him back in one year

## 2013-04-02 NOTE — Patient Instructions (Signed)
Continue on cpap, and keep up with cushion changes and supplies. If your mask leak continues to be an issue, we can set up a time for a mask fitting at the sleep center. Keep working on weight loss followup with me in one year.

## 2013-04-02 NOTE — Progress Notes (Signed)
  Subjective:    Patient ID: Eugene [name], male    DOB: Dec 18, 1945, 67 y.o.   MRN: [id number]  HPI The patient comes in today for followup of his obstructive sleep apnea.  He is wearing CPAP compliantly, and is having no issues with his pressure.  He is having some intermittent mask leaks, and is trying to work through this with his home care company.  He feels that he is sleeping well with the device, and is satisfied with his daytime alertness.  He does still have an occasional awakening during the night that may be more of an environmental issue than his sleep apnea.   Review of Systems  Constitutional: Negative for fever and unexpected weight change.  HENT: Negative for congestion, dental problem, ear pain, nosebleeds, postnasal drip, rhinorrhea, sinus pressure, sneezing, sore throat and trouble swallowing.   Eyes: Negative for redness and itching.  Respiratory: Negative for cough, chest tightness, shortness of breath and wheezing.   Cardiovascular: Negative for palpitations and leg swelling.  Gastrointestinal: Negative for nausea and vomiting.  Genitourinary: Negative for dysuria.  Musculoskeletal: Negative for joint swelling.  Skin: Negative for rash.  Neurological: Negative for headaches.  Hematological: Does not bruise/bleed easily.  Psychiatric/Behavioral: Negative for dysphoric mood. The patient is not nervous/anxious.        Objective:   Physical Exam Obese male in no acute distress Nose without purulence or discharge noted No skin breakdown or pressure necrosis from the CPAP mask Neck without lymphadenopathy or thyromegaly Lower extremities with mild edema, no cyanosis Alert and oriented, moves all 4 extremities.       Assessment & Plan:

## 2013-05-07 ENCOUNTER — Other Ambulatory Visit: Payer: Self-pay | Admitting: *Deleted

## 2013-05-07 MED ORDER — NABUMETONE 750 MG PO TABS: ORAL_TABLET | ORAL | Status: DC

## 2013-08-19 ENCOUNTER — Ambulatory Visit (INDEPENDENT_AMBULATORY_CARE_PROVIDER_SITE_OTHER): Payer: Commercial Managed Care - HMO | Admitting: Internal Medicine

## 2013-08-19 ENCOUNTER — Encounter: Payer: Self-pay | Admitting: Internal Medicine

## 2013-08-19 VITALS — BP 130/72 | HR 75 | Ht 66.0 in | Wt 245.0 lb

## 2013-08-19 DIAGNOSIS — J3089 Other allergic rhinitis: Secondary | ICD-10-CM

## 2013-08-19 DIAGNOSIS — J310 Chronic rhinitis: Secondary | ICD-10-CM

## 2013-08-19 DIAGNOSIS — J309 Allergic rhinitis, unspecified: Secondary | ICD-10-CM

## 2013-08-19 DIAGNOSIS — G4733 Obstructive sleep apnea (adult) (pediatric): Secondary | ICD-10-CM

## 2013-08-19 DIAGNOSIS — J302 Other seasonal allergic rhinitis: Secondary | ICD-10-CM

## 2013-08-19 MED ORDER — AZELASTINE HCL 0.1 % NA SOLN
NASAL | Status: DC
Start: 2013-08-19 — End: 2014-09-28

## 2013-08-19 NOTE — Progress Notes (Signed)
09/15/11- 4 yoM never smoker self referred for allergy evaluation. Former patient at [facility]. Now followed here by Dr Gwenette Greet for OSA. Old records being obtained. He complains that for 4 or 5 months he has had numbness in the right side of his face "like at dentist". Right eye burns and walkers. The vision is okay and hearing okay. No discomfort with his teeth. This sensation has been persistent without change. Dr Gerald Stabs Newman/ ENT did CT scan of sinuses on 04/01/2011 describing mild mucosal thickening with no air-fluid levels. 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 No mass. He says nasal congestion is such that he has to mouth breathe but congestion shifts back and forth. He denies ENT trauma or surgery. Has been taking Astelin and fluticasone nasal sprays with little benefit. 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  10/20/11- 50 yoM never smoker self referred for allergy evaluation. Former patient at [facility]. Now followed here by Dr Gwenette Greet for OSA. At last visit we gave him a nasal nebulizer treatment and sample of Dymista, which seemed to help. When the sample ran out, he went back to [facility] which doesn't work as well. He is just using Astelin now. Last week he had a viral chest cold with hard cough since then has had a dull persistent substernal ache increased by reaching or lifting with his arms. Still has some productive cough with white sputum. Some wheeze.  02/20/12- 82 yoM never smoker followed by [facility] for allergy . Former patient at [facility]. Now followed here by Dr Gwenette Greet for OSA.  last Thursday had sinus headache and could not get rid of; overall has good and bad days; congestion at times. He had been outdoors all day and the next day developed persistent frontal headache. Headache is now clear but he has residual nasal congestion. Dymista nasal spray has been some help. He asks about chronic coated tongue despite rush but with  his toothbrush and using mouthwash. He is not diabetic. Aware of postnasal drip.  08/20/12- 66 yoM never smoker followed by [facility] for allergic rhinitis . Former patient at [facility]. Now followed here by Dr Gwenette Greet for OSA. FOLLOWS FOR: bloody nasal drainage for about past 2 months-comes and goes(mornings are normally the worst time for patient). Using Astelin and loratadine potential for overdrying. Tongue is coated. Remains on CPAP with humidifier, supervised by Dr. Gwenette Greet. Stopped allergy vaccine 1:10 GO  02/18/13- 67 yoM never smoker followed by [facility] for allergic rhinitis . Former patient at [facility]. Now followed here by Dr Gwenette Greet for OSA/ CPAP. FOLLOWS FOR: Has "clogged" days and others "feels clear" Stopped allergy vaccine 1:10 GO Shifting nasal stuffiness usually worse on R side despite otc decongestants. Ears ok.  Known septal deviation and mild chronic sinusitis on CT maxfac 2012. Had unsatisfactory visit with Dr Lucia Gaskins then- never heard back.  08/19/13- 67 yoM never smoker followed by [facility] for allergic rhinitis . Former patient at [facility]. Now followed here by Dr Gwenette Greet for OSA/ CPAP. FOLLOWS FOR: sinus pressure since mid March 2015-has not been on allergy vaccine in years. Saw Dr. Lovenia Shuck about persistent nasal stuffiness. He was not a surgical candidate but told to use decongestant spray just at night. He is not having much seasonal itching or sneezing but uses Astelin nasal spray before he goes outdoors  ROS-see HPI Constitutional:   No-   weight loss, night sweats, fevers, chills, fatigue, lassitude. HEENT:   No-  headaches, difficulty swallowing,  tooth/dental problems, sore throat,       No- sneezing, itching, ear ache, +nasal congestion, no-post nasal drip,  CV:  No-   chest pain, orthopnea, PND, swelling in lower extremities, anasarca, dizziness, palpitations Resp: No-   shortness of breath with exertion or at rest.              No-   productive cough,  No non-productive cough,  No-  coughing up of blood.              No-   change in color of mucus.  No- wheezing.   Skin: No-   rash or lesions. GI:  No-   heartburn, indigestion, abdominal pain, nausea, vomiting,  GU:   MS:  No-   joint pain or swelling.   Neuro-     nothing unusual Psych:  No- change in mood or affect. No depression or anxiety.  No memory loss.  OBJ- Physical Exam General- Alert, Oriented, Affect-appropriate, Distress- none acute.  Skin- rash-none, lesions- none, excoriation- none Lymphadenopathy- none Head- atraumatic            Eyes- Gross vision intact, PERRLA, conjunctivae and secretions clear            Ears- Hearing, canals-normal            Nose-  +mild septal dev, No- mucus, polyps, erosion, perforation. No significant anterior                     obstruction             Throat- Mallampati III-IV , mucosa-clear , drainage- none, tonsils- atrophic Neck- flexible , trachea midline, no stridor , thyroid nl, carotid no bruit Chest - symmetrical excursion , unlabored           Heart/CV- RRR , no murmur , no gallop  , no rub, nl s1 s2                           - JVD- none , edema- none, stasis changes- none, varices- none           Lung- clear to P&A, no-wheeze, cough- none , dullness-none, rub- none           Chest wall-  Abd-  Br/ Gen/ Rectal- Not done, not indicated Extrem- cyanosis- none, clubbing, none, atrophy- none, strength- nl Neuro- grossly intact to observation.

## 2013-08-19 NOTE — Patient Instructions (Addendum)
Don't use the decongestant nose spray at night more than you really need to.  Ask Dr Gwenette Greet if you can try turning the CPAP pressure down one step to see if that is more comfortable for your nose.  Refill script for Astelin nasal spray sent

## 2013-08-20 ENCOUNTER — Other Ambulatory Visit: Payer: Self-pay | Admitting: *Deleted

## 2013-08-20 MED ORDER — AMLODIPINE BESYLATE 5 MG PO TABS: 5.0000 mg | ORAL_TABLET | Freq: Every day | ORAL | Status: DC

## 2013-08-21 ENCOUNTER — Other Ambulatory Visit: Payer: Self-pay | Admitting: *Deleted

## 2013-08-21 MED ORDER — CLONAZEPAM 1 MG PO TABS: 1.0000 mg | ORAL_TABLET | Freq: Every evening | ORAL | Status: DC | PRN

## 2013-08-21 NOTE — Telephone Encounter (Signed)
Faxed script back to [facility].../lmb 

## 2013-09-15 NOTE — Assessment & Plan Note (Signed)
Managed by Dr. Gwenette Greet

## 2013-09-15 NOTE — Assessment & Plan Note (Signed)
Dr. Wilburn Cornelia did not considering much of the surgical candidate for his nasal stuffiness. Plan-educated caution in use of decongestant nasal sprays

## 2013-09-15 NOTE — Assessment & Plan Note (Signed)
No significant seasonal flare but he continues use of Astelin

## 2013-09-17 ENCOUNTER — Ambulatory Visit (INDEPENDENT_AMBULATORY_CARE_PROVIDER_SITE_OTHER): Payer: Commercial Managed Care - HMO | Admitting: Internal Medicine

## 2013-09-17 ENCOUNTER — Encounter: Payer: Self-pay | Admitting: Internal Medicine

## 2013-09-17 ENCOUNTER — Other Ambulatory Visit (INDEPENDENT_AMBULATORY_CARE_PROVIDER_SITE_OTHER): Payer: Commercial Managed Care - HMO

## 2013-09-17 VITALS — BP 122/74 | HR 67 | Temp 98.3°F | Ht 69.0 in | Wt 243.8 lb

## 2013-09-17 DIAGNOSIS — J302 Other seasonal allergic rhinitis: Secondary | ICD-10-CM

## 2013-09-17 DIAGNOSIS — J309 Allergic rhinitis, unspecified: Secondary | ICD-10-CM

## 2013-09-17 DIAGNOSIS — Z0000 Encounter for general adult medical examination without abnormal findings: Secondary | ICD-10-CM

## 2013-09-17 DIAGNOSIS — E669 Obesity, unspecified: Secondary | ICD-10-CM

## 2013-09-17 DIAGNOSIS — J3089 Other allergic rhinitis: Secondary | ICD-10-CM

## 2013-09-17 DIAGNOSIS — G4733 Obstructive sleep apnea (adult) (pediatric): Secondary | ICD-10-CM

## 2013-09-17 DIAGNOSIS — I10 Essential (primary) hypertension: Secondary | ICD-10-CM

## 2013-09-17 DIAGNOSIS — Z23 Encounter for immunization: Secondary | ICD-10-CM

## 2013-09-17 LAB — BASIC METABOLIC PANEL
BUN: 21 mg/dL (ref 6–23)
CHLORIDE: 106 meq/L (ref 96–112)
CO2: 29 meq/L (ref 19–32)
Calcium: 9.7 mg/dL (ref 8.4–10.5)
Creatinine, Ser: 1.4 mg/dL (ref 0.4–1.5)
GFR: 62.79 mL/min (ref >60.00)
GLUCOSE: 86 mg/dL (ref 70–99)
POTASSIUM: 3.8 meq/L (ref 3.5–5.1)
SODIUM: 142 meq/L (ref 135–145)

## 2013-09-17 LAB — URINALYSIS, ROUTINE W REFLEX MICROSCOPIC
Bilirubin Urine: NEGATIVE
HGB URINE DIPSTICK: NEGATIVE
KETONES UR: NEGATIVE
LEUKOCYTES UA: NEGATIVE
Nitrite: NEGATIVE
SPECIFIC GRAVITY, URINE: 1.02 (ref 1.000–1.030)
Total Protein, Urine: NEGATIVE
URINE GLUCOSE: NEGATIVE
Urobilinogen, UA: 0.2 (ref 0.0–1.0)
pH: 6 (ref 5.0–8.0)

## 2013-09-17 LAB — HEPATIC FUNCTION PANEL
ALBUMIN: 4.1 g/dL (ref 3.5–5.2)
ALT: 26 U/L (ref 0–53)
AST: 35 U/L (ref 0–37)
Alkaline Phosphatase: 75 U/L (ref 39–117)
Bilirubin, Direct: 0.1 mg/dL (ref 0.0–0.3)
Total Bilirubin: 0.9 mg/dL (ref 0.2–1.2)
Total Protein: 7 g/dL (ref 6.0–8.3)

## 2013-09-17 LAB — CBC WITH DIFFERENTIAL/PLATELET
BASOS PCT: 0.5 % (ref 0.0–3.0)
Basophils Absolute: 0 10*3/uL (ref 0.0–0.1)
EOS ABS: 0.2 10*3/uL (ref 0.0–0.7)
Eosinophils Relative: 2 % (ref 0.0–5.0)
HEMATOCRIT: 40.6 % (ref 39.0–52.0)
Hemoglobin: 13.6 g/dL (ref 13.0–17.0)
LYMPHS ABS: 2.7 10*3/uL (ref 0.7–4.0)
Lymphocytes Relative: 35.9 % (ref 12.0–46.0)
MCHC: 33.6 g/dL (ref 30.0–36.0)
MCV: 101.7 fl — ABNORMAL HIGH (ref 78.0–100.0)
Monocytes Absolute: 0.6 10*3/uL (ref 0.1–1.0)
Monocytes Relative: 8 % (ref 3.0–12.0)
NEUTROS ABS: 4 10*3/uL (ref 1.4–7.7)
Neutrophils Relative %: 53.6 % (ref 43.0–77.0)
Platelets: 146 10*3/uL — ABNORMAL LOW (ref 150.0–400.0)
RBC: 3.99 Mil/uL — AB (ref 4.22–5.81)
RDW: 12.8 % (ref 11.5–14.6)
WBC: 7.5 10*3/uL (ref 4.5–10.5)

## 2013-09-17 LAB — LIPID PANEL
CHOLESTEROL: 156 mg/dL (ref 0–200)
HDL: 47.1 mg/dL (ref >39.00)
LDL Cholesterol: 90 mg/dL (ref 0–99)
Total CHOL/HDL Ratio: 3
Triglycerides: 97 mg/dL (ref 0.0–149.0)
VLDL: 19.4 mg/dL (ref 0.0–40.0)

## 2013-09-17 LAB — TSH: TSH: 0.62 u[IU]/mL (ref 0.35–4.50)

## 2013-09-17 MED ORDER — LOSARTAN POTASSIUM-HCTZ 50-12.5 MG PO TABS: 1.0000 | ORAL_TABLET | Freq: Two times a day (BID) | ORAL | Status: DC

## 2013-09-17 MED ORDER — CARVEDILOL 25 MG PO TABS: 25.0000 mg | ORAL_TABLET | Freq: Two times a day (BID) | ORAL | Status: DC

## 2013-09-17 NOTE — Assessment & Plan Note (Signed)
Wt Readings from Last 3 Encounters:  09/17/13 243 lb 12.8 oz (110.587 kg)  08/19/13 245 lb (111.131 kg)  04/02/13 247 lb 9.6 oz (112.311 kg)   The patient is asked to make an attempt to improve diet and exercise patterns to aid in medical management of this problem.  Understands losing weight will also help OA, OSA and hypertension mgmt

## 2013-09-17 NOTE — Assessment & Plan Note (Signed)
Working with pulm for same - on CPAP and improved symptoms The current medical regimen is effective;  continue present plan and medications. 

## 2013-09-17 NOTE — Progress Notes (Signed)
Subjective:    Patient ID: Eugene [name], male    DOB: 14-Apr-1946, 68 y.o.   MRN: [id number]  HPI   Here for annual medicare wellness/physical  Diet: heart healthy Physical activity: sedentary Depression/mood screen: negative Hearing: intact to whispered voice Visual acuity: grossly normal, performs annual eye exam  ADLs: capable Fall risk: none Home safety: good Cognitive evaluation: intact to orientation, naming, recall and repetition EOL planning: adv directives, full code/ I agree  I have personally reviewed and have noted 1. The patient's medical and social history 2. Their use of alcohol, tobacco or illicit drugs 3. Their current medications and supplements 4. The patient's functional ability including ADL's, fall risks, home safety risks and hearing or visual impairment. 5. Diet and physical activities 6. Evidence for depression or mood disorders  Also reviewed chronic medical issues and interval medical events  Past Medical History  Diagnosis Date  . Hypertension   . Rheumatic fever     residual pulm stenosis, follows with [facility] every 90mo  . Anxiety   . Allergic rhinitis due to pollen   . ED (erectile dysfunction)   . OBESITY   . Osteoarthritis   . RHINITIS, CHRONIC   . SLEEP APNEA, OBSTRUCTIVE dx 2000    NPSG 2000:  AHI 76/hr CPAP titrated to 11cm 2002   . Pulmonary stenosis, valvar     TEE 07/2011: stable, LVEF 55%, small PFO  . Refusal of blood transfusions as patient is Jehovah's Witness    Family History  Problem Relation Age of Onset  . Heart disease Mother   . Heart disease Father   . Hypertension Other    History  Substance Use Topics  . Smoking status: Never Smoker   . Smokeless tobacco: Not on file  . Alcohol Use: 0.0 oz/week    0 Glasses of wine per week    Review of Systems  Constitutional: Negative for fever, activity change, appetite change, fatigue and unexpected weight change.  HENT: Positive for postnasal drip. Negative for ear  discharge, facial swelling, hearing loss, rhinorrhea, sinus pressure, sneezing, sore throat and trouble swallowing.   Respiratory: Negative for cough, chest tightness, shortness of breath and wheezing.   Cardiovascular: Negative for chest pain, palpitations and leg swelling.  Neurological: Negative for dizziness, weakness and headaches.  Psychiatric/Behavioral: Negative for dysphoric mood. The patient is not nervous/anxious.   All other systems reviewed and are negative.      Objective:   Physical Exam  BP 122/74  Pulse 67  Temp(Src) 98.3 F (36.8 C) (Oral)  Ht 5\' 9"  (1.753 m)  Wt 243 lb 12.8 oz (110.587 kg)  BMI 35.99 kg/m2  SpO2 95% Wt Readings from Last 3 Encounters:  09/17/13 243 lb 12.8 oz (110.587 kg)  08/19/13 245 lb (111.131 kg)  04/02/13 247 lb 9.6 oz (112.311 kg)   Constitutional: he is obese, but appears well-developed and well-nourished. No distress.  HENT: Head: Normocephalic and atraumatic. Ears: B TMs ok, no erythema or effusion; Nose: Nose normal. Mouth/Throat: Oropharynx is clear and moist. No oropharyngeal exudate.  Eyes: Conjunctivae and EOM are normal. Pupils are equal, round, and reactive to light. No scleral icterus.  Neck: Normal range of motion. Neck supple. No JVD present. No thyromegaly present.  Cardiovascular: Normal rate, regular rhythm and normal heart sounds.  No murmur heard. No BLE edema. Pulmonary/Chest: Effort normal and breath sounds normal. No respiratory distress. he has no wheezes.  Abdominal: Soft. Bowel sounds are normal. he exhibits no distension. There  is no tenderness. no masses Musculoskeletal: Normal range of motion, no joint effusions. No gross deformities Neurological: he is alert and oriented to person, place, and time. No cranial nerve deficit. Coordination, balance, strength, speech and gait are normal.  Skin: Skin is warm and dry. No rash noted. No erythema.  Psychiatric: he has a normal mood and affect. behavior is normal.  Judgment and thought content normal.   Lab Results  Component Value Date   WBC 7.0 09/18/2012   HGB 14.0 09/18/2012   HCT 41.6 09/18/2012   PLT 139.0* 09/18/2012   GLUCOSE 99 09/18/2012   CHOL 151 09/18/2012   TRIG 57.0 09/18/2012   HDL 47.00 09/18/2012   LDLCALC 93 09/18/2012   ALT 25 09/18/2012   AST 33 09/18/2012   NA 139 09/18/2012   K 3.9 09/18/2012   CL 107 09/18/2012   CREATININE 1.2 09/18/2012   BUN 13 09/18/2012   CO2 28 09/18/2012   TSH 0.89 09/18/2012   PSA 1.73 09/18/2012    Dg Chest 2 View  10/20/2011   *RADIOLOGY REPORT*  Clinical Data: Cough and chest tightness  CHEST - 2 VIEW  Comparison: Sep 17, 2009  Findings: Mild cardiomegaly is unchanged.  The mediastinum and pulmonary vasculature are within normal limits.  Both lungs are clear. No osseous lesions.  IMPRESSION: Stable, mild cardiomegaly.  Original Report Authenticated By: Duayne Cal, M.D.      Assessment & Plan:   CPX/AWV/v70.0 - Today patient counseled on age appropriate routine health concerns for screening and prevention, each reviewed and up to date or declined. Immunizations reviewed and up to date or declined. Labs/ECG reviewed. Risk factors for depression reviewed and negative. Hearing function and visual acuity are intact. ADLs screened and addressed as needed. Functional ability and level of safety reviewed and appropriate. Education, counseling and referrals performed based on assessed risks today. Patient provided with a copy of personalized plan for preventive services.  Problem List Items Addressed This Visit   HYPERTENSION      BP Readings from Last 3 Encounters:  09/17/13 122/74  08/19/13 130/72  04/02/13 132/90   Will change losartan hctz to BID at pt request, prefers bid to qd dosing -erx done The current medical regimen is effective;  continue present plan and medications.       Relevant Medications      losartan-hydrochlorothiazide (HYZAAR) 50-12.5 MG per tablet      carvedilol (COREG) tablet   Other Relevant  Orders      Basic metabolic panel      Lipid panel      Urinalysis, Routine w reflex microscopic   OBESITY      Wt Readings from Last 3 Encounters:  09/17/13 243 lb 12.8 oz (110.587 kg)  08/19/13 245 lb (111.131 kg)  04/02/13 247 lb 9.6 oz (112.311 kg)   The patient is asked to make an attempt to improve diet and exercise patterns to aid in medical management of this problem.  Understands losing weight will also help OA, OSA and hypertension mgmt    Relevant Orders      TSH   Seasonal and perennial allergic rhinitis     Reviewed seasonal symptoms and ongoing med gmt as per allergist Dr Annamaria Boots Reassured no evidence for infection on exam    Relevant Orders      CBC with Differential      Hepatic function panel      TSH   SLEEP APNEA, OBSTRUCTIVE  Working with pulm for same - on CPAP and improved symptoms The current medical regimen is effective;  continue present plan and medications.    Relevant Orders      TSH    Other Visit Diagnoses   Routine general medical examination at a health care facility    -  Primary    Relevant Orders       Basic metabolic panel       CBC with Differential       Hepatic function panel       Lipid panel       TSH       Urinalysis, Routine w reflex microscopic

## 2013-09-17 NOTE — Assessment & Plan Note (Signed)
Reviewed seasonal symptoms and ongoing med gmt as per allergist Dr Annamaria Boots Reassured no evidence for infection on exam

## 2013-09-17 NOTE — Progress Notes (Signed)
Pre visit review using our clinic review tool, if applicable. No additional management support is needed unless otherwise documented below in the visit note. 

## 2013-09-17 NOTE — Assessment & Plan Note (Signed)
BP Readings from Last 3 Encounters:  09/17/13 122/74  08/19/13 130/72  04/02/13 132/90   Will change losartan hctz to BID at pt request, prefers bid to qd dosing -erx done The current medical regimen is effective;  continue present plan and medications.

## 2013-09-17 NOTE — Patient Instructions (Addendum)
It was good to see you today.  We have reviewed your prior records including labs and tests today  Health Maintenance reviewed -tetanus booster updated today. Please check with your insurance about shingles coverage -all other recommended immunizations and age-appropriate screenings are up-to-date.  Test(s) ordered today. Your results will be released to [facility] (or called to you) after review, usually within 72hours after test completion. If any changes need to be made, you will be notified at that same time.  Medications reviewed and updated Change losartan HCTZ to twice daily and discontinue plain losartan at bedtime No other changes recommended at this time. Your prescription(s) have been submitted to your pharmacy. Please take as directed and contact our office if you believe you are having problem(s) with the medication(s).  Please schedule followup in 6 months for semiannual exam and labs, call sooner if problems.  Health Maintenance, Males A healthy lifestyle and preventative care can promote health and wellness.  Maintain regular health, dental, and eye exams.  Eat a healthy diet. Foods like vegetables, fruits, whole grains, low-fat dairy products, and lean protein foods contain the nutrients you need and are low in calories. Decrease your intake of foods high in solid fats, added sugars, and salt. Get information about a proper diet from your health care provider, if necessary.  Regular physical exercise is one of the most important things you can do for your health. Most adults should get at least 150 minutes of moderate-intensity exercise (any activity that increases your heart rate and causes you to sweat) each week. In addition, most adults need muscle-strengthening exercises on 2 or more days a week.   Maintain a healthy weight. The body mass index (BMI) is a screening tool to identify possible weight problems. It provides an estimate of body fat based on height and weight.  Your health care provider can find your BMI and can help you achieve or maintain a healthy weight. For males 20 years and older:  A BMI below 18.5 is considered underweight.  A BMI of 18.5 to 24.9 is normal.  A BMI of 25 to 29.9 is considered overweight.  A BMI of 30 and above is considered obese.  Maintain normal blood lipids and cholesterol by exercising and minimizing your intake of saturated fat. Eat a balanced diet with plenty of fruits and vegetables. Blood tests for lipids and cholesterol should begin at age 27 and be repeated every 5 years. If your lipid or cholesterol levels are high, you are over 50, or you are at high risk for heart disease, you may need your cholesterol levels checked more frequently.Ongoing high lipid and cholesterol levels should be treated with medicines, if diet and exercise are not working.  If you smoke, find out from your health care provider how to quit. If you do not use tobacco, do not start.  Lung cancer screening is recommended for adults aged 44 80 years who are at high risk for developing lung cancer because of a history of smoking. A yearly low-dose CT scan of the lungs is recommended for people who have at least a 30-pack-year history of smoking and are a current smoker or have quit within the past 15 years. A pack year of smoking is smoking an average of 1 pack of cigarettes a day for 1 year (for example, a 30-pack-year history of smoking could mean smoking 1 pack a day for 30 years or 2 packs a day for 15 years). Yearly screening should continue until the smoker  has stopped smoking for at least 15 years. Yearly screening should be stopped for people who develop a health problem that would prevent them from having lung cancer treatment.  If you choose to drink alcohol, do not have more than 2 drinks per day. One drink is considered to be 12 oz (360 mL) of beer, 5 oz (150 mL) of wine, or 1.5 oz (45 mL) of liquor.  Avoid use of street drugs. Do not share  needles with anyone. Ask for help if you need support or instructions about stopping the use of drugs.  High blood pressure causes heart disease and increases the risk of stroke. Blood pressure should be checked at least every 1 2 years. Ongoing high blood pressure should be treated with medicines if weight loss and exercise are not effective.  If you are 24 68 years old, ask your health care provider if you should take aspirin to prevent heart disease.  Diabetes screening involves taking a blood sample to check your fasting blood sugar level. This should be done once every 3 years after age 53, if you are at a normal weight and without risk factors for diabetes. Testing should be considered at a younger age or be carried out more frequently if you are overweight and have at least 1 risk factor for diabetes.  Colorectal cancer can be detected and often prevented. Most routine colorectal cancer screening begins at the age of 57 and continues through age 68. However, your health care provider may recommend screening at an earlier age if you have risk factors for colon cancer. On a yearly basis, your health care provider may provide home test kits to check for hidden blood in the stool. A small camera at the end of a tube may be used to directly examine the colon (sigmoidoscopy or colonoscopy) to detect the earliest forms of colorectal cancer. Talk to your health care provider about this at age 59, when routine screening begins. A direct exam of the colon should be repeated every 5 10 years through age 42, unless early forms of pre-cancerous polyps or small growths are found.  People who are at an increased risk for hepatitis B should be screened for this virus. You are considered at high risk for hepatitis B if:  You were born in a country where hepatitis B occurs often. Talk with your health care provider about which countries are considered high-risk.  Your parents were born in a high-risk country and  you have not received a shot to protect against hepatitis B (hepatitis B vaccine).  You have HIV or AIDS.  You use needles to inject street drugs.  You live with, or have sex with, someone who has hepatitis B.  You are a man who has sex with other men (MSM).  You get hemodialysis treatment.  You take certain medicines for conditions like cancer, organ transplantation, and autoimmune conditions.  Hepatitis C blood testing is recommended for all people born from [birth year] through 1965 and any individual with known risk factors for hepatitis C.  Healthy men should no longer receive prostate-specific antigen (PSA) blood tests as part of routine cancer screening. Talk to your health care provider about prostate cancer screening.  Testicular cancer screening is not recommended for adolescents or adult males who have no symptoms. Screening includes self-exam, a health care provider exam, and other screening tests. Consult with your health care provider about any symptoms you have or any concerns you have about testicular cancer.  Practice safe  sex. Use condoms and avoid high-risk sexual practices to reduce the spread of sexually transmitted infections (STIs).  Use sunscreen. Apply sunscreen liberally and repeatedly throughout the day. You should seek shade when your shadow is shorter than you. Protect yourself by wearing long sleeves, pants, a wide-brimmed hat, and sunglasses year round, whenever you are outdoors.  Tell your health care provider of new moles or changes in moles, especially if there is a change in shape or color. Also tell your provider if a mole is larger than the size of a pencil eraser.  A one-time screening for abdominal aortic aneurysm (AAA) and surgical repair of large AAAs by ultrasound is recommended for men aged 49 75 years who are current or former smokers.  Stay current with your vaccines (immunizations). Document Released: 10/29/2007 Document Revised: 02/20/2013 Document  Reviewed: 09/27/2010 [facility] Patient Information 2014 [place], [place].

## 2013-09-18 ENCOUNTER — Telehealth: Payer: Self-pay | Admitting: Internal Medicine

## 2013-09-18 NOTE — Telephone Encounter (Signed)
Relevant patient education assigned to patient using Emmi. ° °

## 2013-09-23 ENCOUNTER — Ambulatory Visit: Payer: Medicare PPO | Admitting: Internal Medicine

## 2013-12-16 ENCOUNTER — Other Ambulatory Visit: Payer: Self-pay

## 2013-12-16 MED ORDER — NABUMETONE 750 MG PO TABS: ORAL_TABLET | ORAL | Status: DC

## 2013-12-18 ENCOUNTER — Telehealth: Payer: Self-pay | Admitting: Internal Medicine

## 2013-12-18 ENCOUNTER — Other Ambulatory Visit: Payer: Self-pay

## 2013-12-18 MED ORDER — NABUMETONE 750 MG PO TABS: ORAL_TABLET | ORAL | Status: DC

## 2013-12-18 NOTE — Telephone Encounter (Signed)
Patient called stating [facility] called him stating they have not heard from [place] regarding rx request for nabumetone (RELAFEN) 750 MG tablet. Pt's chart shows this was sent 12/16/13. Can you check on this and make sure they received the rx?

## 2014-01-30 ENCOUNTER — Encounter: Payer: Self-pay | Admitting: Gastroenterology

## 2014-02-13 ENCOUNTER — Other Ambulatory Visit: Payer: Self-pay

## 2014-02-13 MED ORDER — NABUMETONE 750 MG PO TABS: ORAL_TABLET | ORAL | Status: DC

## 2014-03-25 ENCOUNTER — Ambulatory Visit (INDEPENDENT_AMBULATORY_CARE_PROVIDER_SITE_OTHER): Payer: Commercial Managed Care - HMO | Admitting: Internal Medicine

## 2014-03-25 ENCOUNTER — Encounter: Payer: Self-pay | Admitting: Internal Medicine

## 2014-03-25 VITALS — BP 124/70 | HR 78 | Temp 98.5°F | Ht 69.0 in | Wt 245.4 lb

## 2014-03-25 DIAGNOSIS — R0789 Other chest pain: Secondary | ICD-10-CM

## 2014-03-25 DIAGNOSIS — Z23 Encounter for immunization: Secondary | ICD-10-CM

## 2014-03-25 DIAGNOSIS — L600 Ingrowing nail: Secondary | ICD-10-CM

## 2014-03-25 DIAGNOSIS — M25511 Pain in right shoulder: Secondary | ICD-10-CM

## 2014-03-25 NOTE — Progress Notes (Signed)
Subjective:    Patient ID: Eugene [name], male    DOB: 08-Dec-1945, 68 y.o.   MRN: [id number]  HPI  Patient here for follow up Reviewed chronic medical issues and interval medical events  Past Medical History  Diagnosis Date  . Hypertension   . Rheumatic fever     residual pulm stenosis, follows with ganji every 75mo  . Anxiety   . Allergic rhinitis due to pollen   . ED (erectile dysfunction)   . OBESITY   . Osteoarthritis   . RHINITIS, CHRONIC   . SLEEP APNEA, OBSTRUCTIVE dx 2000    NPSG 2000:  AHI 76/hr CPAP titrated to 11cm 2002   . Pulmonary stenosis, valvar     TEE 07/2011: stable, LVEF 55%, small PFO  . Refusal of blood transfusions as patient is Jehovah's Witness     Review of Systems  Constitutional: Negative for fever, fatigue and unexpected weight change.  Respiratory: Negative for cough and shortness of breath.   Cardiovascular: Negative for chest pain, palpitations and leg swelling.  Musculoskeletal: Positive for arthralgias (R shoulder pain when lying down (supine or on left side) x 4-5 weeks). Negative for myalgias and neck pain.  Neurological: Negative for weakness and numbness.       Objective:   Physical Exam  BP 124/70 mmHg  Pulse 78  Temp(Src) 98.5 F (36.9 C) (Oral)  Ht 5\' 9"  (1.753 m)  Wt 245 lb 7 oz (111.33 kg)  BMI 36.23 kg/m2  SpO2 98% Wt Readings from Last 3 Encounters:  03/25/14 245 lb 7 oz (111.33 kg)  09/17/13 243 lb 12.8 oz (110.587 kg)  08/19/13 245 lb (111.131 kg)   Constitutional: he is obese, appears well-developed and well-nourished. No distress.  Neck: Normal range of motion. Neck supple. No JVD present. No thyromegaly present.  Cardiovascular: Normal rate, regular rhythm and normal heart sounds.  No murmur heard. No BLE edema. Pulmonary/Chest: Effort normal and breath sounds normal. No respiratory distress. he has no wheezes.  MSkel: R shoulder with normal range of motion on forward flexion, abduction, and internal rotation.  Negative impingement signs. Good strength with stressing of rotator cuff. No pain with crossed arm adduction or referred pain into distal deltoid. Nontender over a.c. joint and subacromial. Skin: Bilateral ingrown great toenails, no erythema or evidence for infection Psychiatric: he has a normal mood and affect. His behavior is normal. Judgment and thought content normal.   Lab Results  Component Value Date   WBC 7.5 09/17/2013   HGB 13.6 09/17/2013   HCT 40.6 09/17/2013   PLT 146.0* 09/17/2013   GLUCOSE 86 09/17/2013   CHOL 156 09/17/2013   TRIG 97.0 09/17/2013   HDL 47.10 09/17/2013   LDLCALC 90 09/17/2013   ALT 26 09/17/2013   AST 35 09/17/2013   NA 142 09/17/2013   K 3.8 09/17/2013   CL 106 09/17/2013   CREATININE 1.4 09/17/2013   BUN 21 09/17/2013   CO2 29 09/17/2013   TSH 0.62 09/17/2013   PSA 1.73 09/18/2012    Dg Chest 2 View  10/20/2011   *RADIOLOGY REPORT*  Clinical Data: Cough and chest tightness  CHEST - 2 VIEW  Comparison: Sep 17, 2009  Findings: Mild cardiomegaly is unchanged.  The mediastinum and pulmonary vasculature are within normal limits.  Both lungs are clear. No osseous lesions.  IMPRESSION: Stable, mild cardiomegaly.  Original Report Authenticated By: Duayne Cal, M.D.      Assessment & Plan:   Atypical chest pain -  Nocturnal only, R anterior shoulder pain - no RTC or impingement symptoms - not reproducible with movement - pain only when lying supine  ECG today - no ischemia or arrythmia  Refer to sports med -  symptoms improved in past 3 weeks with topical analgesia (icy hot) - consider eval for neck etiology of symptoms worsen or recurrent    ingrown great toenails bilaterally. No evidence for infection on exam. Refer to podiatry

## 2014-03-25 NOTE — Progress Notes (Signed)
Pre visit review using our clinic review tool, if applicable. No additional management support is needed unless otherwise documented below in the visit note. 

## 2014-03-25 NOTE — Patient Instructions (Signed)
It was good to see you today.  We have reviewed your prior records including labs and tests today  Medications reviewed and updated, no changes recommended at this time.  we'll make referral to dietary for your toenails and to sports medicine for evaluation of your shoulder and neck pain symptoms. Our office will contact you regarding appointment(s) once made.  I recommend proceeding with periodontal cleaning as suggested by your dental specialist  You are not due for follow-up colonoscopy until 2019  Please schedule followup in 6 months for annual exam and labs, call sooner if problems.

## 2014-03-28 ENCOUNTER — Ambulatory Visit (INDEPENDENT_AMBULATORY_CARE_PROVIDER_SITE_OTHER): Payer: Commercial Managed Care - HMO | Admitting: Podiatry

## 2014-03-28 ENCOUNTER — Encounter: Payer: Self-pay | Admitting: Podiatry

## 2014-03-28 VITALS — BP 155/75 | HR 80 | Resp 15 | Ht 70.0 in | Wt 245.0 lb

## 2014-03-28 DIAGNOSIS — M79673 Pain in unspecified foot: Secondary | ICD-10-CM

## 2014-03-28 DIAGNOSIS — M79676 Pain in unspecified toe(s): Secondary | ICD-10-CM

## 2014-03-28 DIAGNOSIS — L03012 Cellulitis of left finger: Secondary | ICD-10-CM

## 2014-03-28 DIAGNOSIS — L600 Ingrowing nail: Secondary | ICD-10-CM

## 2014-03-28 DIAGNOSIS — L03039 Cellulitis of unspecified toe: Secondary | ICD-10-CM

## 2014-03-28 MED ORDER — CLINDAMYCIN HCL 300 MG PO CAPS: 300.0000 mg | ORAL_CAPSULE | Freq: Three times a day (TID) | ORAL | Status: DC

## 2014-03-28 NOTE — Patient Instructions (Addendum)

## 2014-03-28 NOTE — Progress Notes (Signed)
   Subjective:    Patient ID: Eugene [name], male    DOB: 01-15-1946, 68 y.o.   MRN: [id number]  HPI 68 year old male presents the office today with complaints of bilateral big toe nail ingrown. He states the areas have been ingrowing for a period of time and this is recurrent. He states he has pain gently over the nail borders. Also has pain with shoe gear pressing of the areas. On the left side he has noticed drainage coming from around the nail border. Denies any systemic complaints as fevers, chills, not, vomiting. No other complaints at this time.   Review of Systems  HENT: Positive for congestion.   Gastrointestinal: Positive for constipation.  Musculoskeletal: Positive for back pain.  All other systems reviewed and are negative.      Objective:   Physical Exam AAO x3, NAD DP/PT pulses palpable bilaterally, CRT less than 3 seconds Protective sensation intact with Simms Weinstein monofilament, vibratory sensation intact, Achilles tendon reflex intact There is incurvation on both the medial and lateral nail borders of bilateral hallux. There is tenderness directly overlying these areas. On the left hallux there is evidence of drainage on the lateral aspect the nail border with a small amount of purulence identified. There is mild overlying edema. There is no surrounding erythema or ascending cellulitis. There are no other areas of discomfort bilateral lower extremities. No other lesions identified. No calf pain, swelling, warmth, erythema. MMT 5/5, ROM WNL     Assessment & Plan:  68 year old male with bilateral medial and lateral nail border ingrown toenails, left hallux paronychia. -Treatment options both conservative and surgical were discussed including alternatives, risks, complications. -After discussion, the patient is requesting partial nail avulsions to be performed due to the pain. I discussed with him possible chemical matricectomy however he does not want is performed.  Risks and complications of the procedure were discussed with him for which she understood and verbally consented to the procedure. Under sterile conditions a total of 2.5 mL of a one-to-one mixture of 2% lidocaine plain and 0.5% Marcaine plain was infiltrated in a hallux block fashion bilaterally. Anesthetized the skin was then prepped in sterile fashion. Tourniquets were applied. Next the medial and lateral nail borders of bilateral hallux nails were then sharply excised making sure to remove the entire offending nail border. There is a small amount of purulence identified on the left lateral nail border. Once the nail was removed and the areas were debrided no further purulence or clinical signs of infection were noted. The area was then irrigated and Silvadene was applied followed by dry sterile dressing. After application a dressing the tourniquets were removed and there was noted to be an immediate capillary refill time noted to the digits. Patient tolerated the procedure well without any complications. Post procedure instructions were discussed the patient for which she verbally understood he was also provided written instructions. -Prescribed clindamycin -Monitor for any clinical signs or symptoms of infection and directed to call the office immediately if any are to occur or go to the emergency room. -Follow-up in 1 week. In the meantime, call the office with any questions, concerns, change in symptoms. Follow-up with PCP for other issues mentioned in the review of systems.

## 2014-04-01 ENCOUNTER — Encounter: Payer: Self-pay | Admitting: Podiatry

## 2014-04-04 ENCOUNTER — Ambulatory Visit (INDEPENDENT_AMBULATORY_CARE_PROVIDER_SITE_OTHER): Payer: Commercial Managed Care - HMO | Admitting: Podiatry

## 2014-04-04 ENCOUNTER — Encounter: Payer: Self-pay | Admitting: Podiatry

## 2014-04-04 VITALS — BP 112/78 | HR 74 | Resp 17

## 2014-04-04 DIAGNOSIS — L03012 Cellulitis of left finger: Secondary | ICD-10-CM

## 2014-04-04 DIAGNOSIS — L600 Ingrowing nail: Secondary | ICD-10-CM

## 2014-04-04 NOTE — Patient Instructions (Signed)
Continue epsom salt soaks followed by antibiotic ointment and a band-aid. Can leave uncovered at night. Continue this until completely healed. Monitor for any signs/symptoms of infection. Call the office immediately if any occur or go directly to the emergency room. Call with any questions/concerns.

## 2014-04-08 ENCOUNTER — Encounter: Payer: Self-pay | Admitting: Podiatry

## 2014-04-08 NOTE — Progress Notes (Signed)
Patient ID: Eugene [name], male   DOB: 1945/10/04, 68 y.o.   MRN: [id number]  Subjective: 68 year old male returns the office if follow-up evaluation 1 week status post bilateral medial and lateral nail border ingrown toenail partial nail avulsions due to ingrown toenail and left hallux paronychia. He states that on the left side he did have a small amount of purulence the first day or two after the procedure but he has not noticed any drainage since then. He states that he has continued to soak his feet in Epson salts twice a day followed by covering them with antibiotic ointment and a Band-Aid. He has continued his course of antibiotics. Denies any systemic complaints such as fevers, chills, nausea, vomiting. No other complaints at this time. No acute changes since last appointment.  Objective: AAO x3, NAD DP/PT pulses palpable bilaterally, CRT less than 3 seconds Protective sensation intact with Simms Weinstein monofilament, vibratory sensation intact Status post medial and lateral partial nail avulsions of bilateral hallux and the sites appear to be healing appropriately for this time. Small amount of granulation tissue in the proximal nail borders. There is no drainage or purulence identified at this time. There is no tenderness to palpation around the nail sites. No surrounding erythema or ascending cellulitis. No conical signs of infection. No pain with calf compression, swelling, warmth, erythema.  Assessment: 68 year old male 1 week status post bilateral partial nail avulsion secondary to ingrown toenail and paronychia left hallux  Plan: -Treatment options discussed including alternatives, risks, complications. -At this time recommended to continue with Epsom salts soaks twice a day followed by antibiotic ointment and a Band-Aid. Can leave uncovered at night. Continue this until the area has completely healed. Finish course of antibiotics. Monitor for any clinical signs or symptoms of  infection and directed to call the office immediately if any are to occur or go to the emergency room. Follow-up in 2 weeks if the area has not completely healed or sooner if any palms are to arise. In the meantime, call the office with any questions, concerns, change in symptoms.

## 2014-04-15 ENCOUNTER — Other Ambulatory Visit: Payer: Self-pay

## 2014-04-15 MED ORDER — NABUMETONE 750 MG PO TABS: ORAL_TABLET | ORAL | Status: DC

## 2014-04-18 ENCOUNTER — Ambulatory Visit (INDEPENDENT_AMBULATORY_CARE_PROVIDER_SITE_OTHER): Payer: Commercial Managed Care - HMO | Admitting: Podiatry

## 2014-04-18 ENCOUNTER — Encounter: Payer: Self-pay | Admitting: Podiatry

## 2014-04-18 VITALS — BP 195/88 | HR 57 | Resp 14

## 2014-04-18 DIAGNOSIS — L03012 Cellulitis of left finger: Secondary | ICD-10-CM

## 2014-04-18 DIAGNOSIS — L600 Ingrowing nail: Secondary | ICD-10-CM

## 2014-04-18 NOTE — Patient Instructions (Signed)
Over the counter treatment for nail fungus is called fungi-nail.  Onychomycosis/Fungal Toenails  WHAT IS IT? An infection that lies within the keratin of your nail plate that is caused by a fungus.  WHY ME? Fungal infections affect all ages, sexes, races, and creeds.  There may be many factors that predispose you to a fungal infection such as age, coexisting medical conditions such as diabetes, or an autoimmune disease; stress, medications, fatigue, genetics, etc.  Bottom line: fungus thrives in a warm, moist environment and your shoes offer such a location.  IS IT CONTAGIOUS? Theoretically, yes.  You do not want to share shoes, nail clippers or files with someone who has fungal toenails.  Walking around barefoot in the same room or sleeping in the same bed is unlikely to transfer the organism.  It is important to realize, however, that fungus can spread easily from one nail to the next on the same foot.  HOW DO WE TREAT THIS?  There are several ways to treat this condition.  Treatment may depend on many factors such as age, medications, pregnancy, liver and kidney conditions, etc.  It is best to ask your doctor which options are available to you.  1. No treatment.   Unlike many other medical concerns, you can live with this condition.  However for many people this can be a painful condition and may lead to ingrown toenails or a bacterial infection.  It is recommended that you keep the nails cut short to help reduce the amount of fungal nail. 2. Topical treatment.  These range from herbal remedies to prescription strength nail lacquers.  About 40-50% effective, topicals require twice daily application for approximately 9 to 12 months or until an entirely new nail has grown out.  The most effective topicals are medical grade medications available through physicians offices. 3. Oral antifungal medications.  With an 80-90% cure rate, the most common oral medication requires 3 to 4 months of therapy and  stays in your system for a year as the new nail grows out.  Oral antifungal medications do require blood work to make sure it is a safe drug for you.  A liver function panel will be performed prior to starting the medication and after the first month of treatment.  It is important to have the blood work performed to avoid any harmful side effects.  In general, this medication safe but blood work is required. 4. Laser Therapy.  This treatment is performed by applying a specialized laser to the affected nail plate.  This therapy is noninvasive, fast, and non-painful.  It is not covered by insurance and is therefore, out of pocket.  The results have been very good with a 80-95% cure rate.  [facility] is the only practice in the area to offer this therapy. 5. Permanent Nail Avulsion.  Removing the entire nail so that a new nail will not grow back.

## 2014-04-21 ENCOUNTER — Other Ambulatory Visit: Payer: Self-pay | Admitting: Internal Medicine

## 2014-04-21 ENCOUNTER — Encounter: Payer: Self-pay | Admitting: Podiatry

## 2014-04-21 NOTE — Progress Notes (Signed)
Patient ID: Eugene [name], male   DOB: [birth year], 68 y.o.   MRN: [id number]  Subjective: 68 year old male presents the office they fall evaluation of bilateral medial lateral hallux partial nail avulsion secondary to ingrown toenail and left hallux paronychia. He states that he believes the areas have healed. He has no pain along the areas. Denies any drainage or swelling to the area. No drainage or purulence at that time. No redness or streaking. Denies any systemic complaints as fevers, chills, nausea, vomiting. No acute changes since last appointment and no other complaints at this time.  Objective: AAO x3, NAD DP/PT pulses palpable bilaterally, CRT less than 3 seconds Protective sensation intact with Simms Weinstein monofilament, vibratory sensation intact, Achilles tendon reflex intact Bilateral hallux medial lateral nail avulsions which is healed at this time. There is no swelling erythema, edema, increased warmth, ascending cellulitis. No tenderness to palpation overlying the nail borders. Nails hypertrophic, discolored, brittle, dystrophic. No sign erythema or drainage from the nails at this time. No PSS of the nails. No open lesions or pre-ulcerative lesions. No pain with calf compression, swelling, warmth, erythema. MMT 5/5, ROM WNL  Assessment: 68 year old male follow-up evaluation status post bilateral hallux partial nail avulsions to both the medial and lateral nail border, healed at this time.  Plan: -Treatment options discussed including all alternatives, risks, complications -At this time, the sites appear to be healed without any complications. Discussed with the patient that should there be reoccurance, may need a chemical matrixectomy. Monitor for any changes.  -No need for further antibiotics. -Discussed the issues for onychomycosis. At this time patient will try over-the-counter fungi nail. -Follow-up as needed. Call with any questions/concerns/change in symptoms.   -Follow-up with PCP for blood pressure. Currently the patient is asymptomatic.

## 2014-05-12 ENCOUNTER — Other Ambulatory Visit: Payer: Self-pay | Admitting: Family Medicine

## 2014-05-13 NOTE — Telephone Encounter (Signed)
#  6 until Val back Not on NTG

## 2014-06-13 ENCOUNTER — Other Ambulatory Visit: Payer: Self-pay | Admitting: Internal Medicine

## 2014-07-07 ENCOUNTER — Other Ambulatory Visit: Payer: Self-pay | Admitting: Internal Medicine

## 2014-07-22 ENCOUNTER — Encounter: Payer: Self-pay | Admitting: Internal Medicine

## 2014-07-22 ENCOUNTER — Other Ambulatory Visit (INDEPENDENT_AMBULATORY_CARE_PROVIDER_SITE_OTHER): Payer: Commercial Managed Care - HMO

## 2014-07-22 ENCOUNTER — Ambulatory Visit (INDEPENDENT_AMBULATORY_CARE_PROVIDER_SITE_OTHER): Payer: Commercial Managed Care - HMO | Admitting: Internal Medicine

## 2014-07-22 VITALS — BP 144/76 | HR 97 | Temp 98.4°F | Resp 16 | Ht 68.0 in | Wt 245.0 lb

## 2014-07-22 DIAGNOSIS — J302 Other seasonal allergic rhinitis: Secondary | ICD-10-CM

## 2014-07-22 DIAGNOSIS — Z0000 Encounter for general adult medical examination without abnormal findings: Secondary | ICD-10-CM | POA: Insufficient documentation

## 2014-07-22 DIAGNOSIS — I10 Essential (primary) hypertension: Secondary | ICD-10-CM | POA: Diagnosis not present

## 2014-07-22 DIAGNOSIS — J3089 Other allergic rhinitis: Secondary | ICD-10-CM

## 2014-07-22 DIAGNOSIS — J309 Allergic rhinitis, unspecified: Secondary | ICD-10-CM | POA: Diagnosis not present

## 2014-07-22 DIAGNOSIS — M1990 Unspecified osteoarthritis, unspecified site: Secondary | ICD-10-CM | POA: Diagnosis not present

## 2014-07-22 DIAGNOSIS — E669 Obesity, unspecified: Secondary | ICD-10-CM

## 2014-07-22 LAB — LIPID PANEL
Cholesterol: 145 mg/dL (ref 0–200)
HDL: 42.3 mg/dL (ref >39.00)
LDL Cholesterol: 83 mg/dL (ref 0–99)
NonHDL: 102.7
Total CHOL/HDL Ratio: 3
Triglycerides: 99 mg/dL (ref 0.0–149.0)
VLDL: 19.8 mg/dL (ref 0.0–40.0)

## 2014-07-22 LAB — COMPREHENSIVE METABOLIC PANEL
ALT: 23 U/L (ref 0–53)
AST: 36 U/L (ref 0–37)
Albumin: 4.3 g/dL (ref 3.5–5.2)
Alkaline Phosphatase: 70 U/L (ref 39–117)
BUN: 26 mg/dL — AB (ref 6–23)
CO2: 33 mEq/L — ABNORMAL HIGH (ref 19–32)
Calcium: 9.5 mg/dL (ref 8.4–10.5)
Chloride: 105 mEq/L (ref 96–112)
Creatinine, Ser: 1.55 mg/dL — ABNORMAL HIGH (ref 0.40–1.50)
GFR: 57.53 mL/min — ABNORMAL LOW (ref >60.00)
GLUCOSE: 90 mg/dL (ref 70–99)
POTASSIUM: 4.2 meq/L (ref 3.5–5.1)
Sodium: 141 mEq/L (ref 135–145)
TOTAL PROTEIN: 7.2 g/dL (ref 6.0–8.3)
Total Bilirubin: 0.8 mg/dL (ref 0.2–1.2)

## 2014-07-22 LAB — HEMOGLOBIN A1C: HEMOGLOBIN A1C: 4.6 % (ref 4.6–6.5)

## 2014-07-22 LAB — TSH: TSH: 0.58 u[IU]/mL (ref 0.35–4.50)

## 2014-07-22 MED ORDER — CLONAZEPAM 1 MG PO TABS: 1.0000 mg | ORAL_TABLET | Freq: Every day | ORAL | Status: DC

## 2014-07-22 NOTE — Assessment & Plan Note (Signed)
Colonoscopy due in 2019, talked with him about prevnar today and he would like to think about it. Has not had shingles. Had had flu shot and pneumonia shot 23. Schedule provided to the patient regarding screening. Checking blood work today including kidney and liver function, thyroid, lipid, HgA1c.

## 2014-07-22 NOTE — Progress Notes (Signed)
   Subjective:    Patient ID: Eugene [name], male    DOB: May 18, 1945, 69 y.o.   MRN: [id number]  HPI Here for medicare wellness, having back pain which is worse since November. He was supposed to go see sports medicine but never got a call about the appointment. Does well with walking and moving but not when he is lying it hurts. He has to lie flat.   Diet: heart healthy Physical activity: sedentary (goes to [employer] 2 times a week) Depression/mood screen: negative Hearing: intact to whispered voice Visual acuity: grossly normal, performs annual eye exam  ADLs: capable Fall risk: none Home safety: good Cognitive evaluation: intact to orientation, naming, recall and repetition EOL planning: adv directives, full code/ I agree  I have personally reviewed and have noted 1. The patient's medical and social history - no changes, updated today 2. Their use of alcohol, tobacco or illicit drugs 3. Their current medications and supplements 4. The patient's functional ability including ADL's, fall risks, home safety risks and hearing or visual impairment. 5. Diet and physical activities 6. Evidence for depression or mood disorders 7. Care team reviewed and updated (available in snapshot)  Review of Systems  Constitutional: Negative for fever, chills, activity change, appetite change, fatigue and unexpected weight change.  HENT: Negative.   Eyes: Negative.   Respiratory: Negative for cough, chest tightness, shortness of breath and wheezing.   Cardiovascular: Negative for chest pain, palpitations and leg swelling.  Gastrointestinal: Negative for nausea, abdominal pain, diarrhea, constipation and abdominal distention.  Musculoskeletal: Negative.   Skin: Negative.   Neurological: Negative.   Psychiatric/Behavioral: Negative.       Objective:   Physical Exam  Constitutional: He is oriented to person, place, and time. He appears well-developed and well-nourished.  Overweight  HENT:    Head: Normocephalic and atraumatic.  Eyes: EOM are normal.  Neck: Normal range of motion.  Cardiovascular: Normal rate and regular rhythm.   Pulmonary/Chest: Effort normal and breath sounds normal. No respiratory distress. He has no wheezes.  Abdominal: Soft. Bowel sounds are normal. He exhibits no distension. There is no tenderness.  Neurological: He is alert and oriented to person, place, and time. Coordination normal.  Skin: Skin is warm and dry.  Psychiatric: He has a normal mood and affect.   Filed Vitals:   07/22/14 1100  BP: 144/76  Pulse: 97  Temp: 98.4 F (36.9 C)  TempSrc: Oral  Resp: 16  Height: 5\' 8"  (1.727 m)  Weight: 245 lb (111.131 kg)  SpO2: 97%      Assessment & Plan:

## 2014-07-22 NOTE — Assessment & Plan Note (Signed)
He is going to the gym twice a week and encouraged him to do more. He does need to lose some weight and talked with him about diet and exercise.

## 2014-07-22 NOTE — Patient Instructions (Signed)
We will check the blood work and the thyroid today.   We have refilled the sleeping medicine for you today.   Keep working on exercising about 3-4 times per week for about 30 minutes per day to keep your body and heart healthy.   We will call you back about the results. Come back and see Dr. Asa Lente in about 6 months.   Health Maintenance A healthy lifestyle and preventative care can promote health and wellness.  Maintain regular health, dental, and eye exams.  Eat a healthy diet. Foods like vegetables, fruits, whole grains, low-fat dairy products, and lean protein foods contain the nutrients you need and are low in calories. Decrease your intake of foods high in solid fats, added sugars, and salt. Get information about a proper diet from your health care provider, if necessary.  Regular physical exercise is one of the most important things you can do for your health. Most adults should get at least 150 minutes of moderate-intensity exercise (any activity that increases your heart rate and causes you to sweat) each week. In addition, most adults need muscle-strengthening exercises on 2 or more days a week.   Maintain a healthy weight. The body mass index (BMI) is a screening tool to identify possible weight problems. It provides an estimate of body fat based on height and weight. Your health care provider can find your BMI and can help you achieve or maintain a healthy weight. For males 20 years and older:  A BMI below 18.5 is considered underweight.  A BMI of 18.5 to 24.9 is normal.  A BMI of 25 to 29.9 is considered overweight.  A BMI of 30 and above is considered obese.  Maintain normal blood lipids and cholesterol by exercising and minimizing your intake of saturated fat. Eat a balanced diet with plenty of fruits and vegetables. Blood tests for lipids and cholesterol should begin at age 38 and be repeated every 5 years. If your lipid or cholesterol levels are high, you are over age  3, or you are at high risk for heart disease, you may need your cholesterol levels checked more frequently.Ongoing high lipid and cholesterol levels should be treated with medicines if diet and exercise are not working.  If you smoke, find out from your health care provider how to quit. If you do not use tobacco, do not start.  Lung cancer screening is recommended for adults aged 45-80 years who are at high risk for developing lung cancer because of a history of smoking. A yearly low-dose CT scan of the lungs is recommended for people who have at least a 30-pack-year history of smoking and are current smokers or have quit within the past 15 years. A pack year of smoking is smoking an average of 1 pack of cigarettes a day for 1 year (for example, a 30-pack-year history of smoking could mean smoking 1 pack a day for 30 years or 2 packs a day for 15 years). Yearly screening should continue until the smoker has stopped smoking for at least 15 years. Yearly screening should be stopped for people who develop a health problem that would prevent them from having lung cancer treatment.  If you choose to drink alcohol, do not have more than 2 drinks per day. One drink is considered to be 12 oz (360 mL) of beer, 5 oz (150 mL) of wine, or 1.5 oz (45 mL) of liquor.  Avoid the use of street drugs. Do not share needles with anyone. Ask  for help if you need support or instructions about stopping the use of drugs.  High blood pressure causes heart disease and increases the risk of stroke. Blood pressure should be checked at least every 1-2 years. Ongoing high blood pressure should be treated with medicines if weight loss and exercise are not effective.  If you are 13-63 years old, ask your health care provider if you should take aspirin to prevent heart disease.  Diabetes screening involves taking a blood sample to check your fasting blood sugar level. This should be done once every 3 years after age 98 if you are at  a normal weight and without risk factors for diabetes. Testing should be considered at a younger age or be carried out more frequently if you are overweight and have at least 1 risk factor for diabetes.  Colorectal cancer can be detected and often prevented. Most routine colorectal cancer screening begins at the age of 85 and continues through age 34. However, your health care provider may recommend screening at an earlier age if you have risk factors for colon cancer. On a yearly basis, your health care provider may provide home test kits to check for hidden blood in the stool. A small camera at the end of a tube may be used to directly examine the colon (sigmoidoscopy or colonoscopy) to detect the earliest forms of colorectal cancer. Talk to your health care provider about this at age 63 when routine screening begins. A direct exam of the colon should be repeated every 5-10 years through age 22, unless early forms of precancerous polyps or small growths are found.  People who are at an increased risk for hepatitis B should be screened for this virus. You are considered at high risk for hepatitis B if:  You were born in a country where hepatitis B occurs often. Talk with your health care provider about which countries are considered high risk.  Your parents were born in a high-risk country and you have not received a shot to protect against hepatitis B (hepatitis B vaccine).  You have HIV or AIDS.  You use needles to inject street drugs.  You live with, or have sex with, someone who has hepatitis B.  You are a man who has sex with other men (MSM).  You get hemodialysis treatment.  You take certain medicines for conditions like cancer, organ transplantation, and autoimmune conditions.  Hepatitis C blood testing is recommended for all people born from [birth year] through 1965 and any individual with known risk factors for hepatitis C.  Healthy men should no longer receive prostate-specific antigen  (PSA) blood tests as part of routine cancer screening. Talk to your health care provider about prostate cancer screening.  Testicular cancer screening is not recommended for adolescents or adult males who have no symptoms. Screening includes self-exam, a health care provider exam, and other screening tests. Consult with your health care provider about any symptoms you have or any concerns you have about testicular cancer.  Practice safe sex. Use condoms and avoid high-risk sexual practices to reduce the spread of sexually transmitted infections (STIs).  You should be screened for STIs, including gonorrhea and chlamydia if:  You are sexually active and are younger than 24 years.  You are older than 24 years, and your health care provider tells you that you are at risk for this type of infection.  Your sexual activity has changed since you were last screened, and you are at an increased risk for chlamydia or  gonorrhea. Ask your health care provider if you are at risk.  If you are at risk of being infected with HIV, it is recommended that you take a prescription medicine daily to prevent HIV infection. This is called pre-exposure prophylaxis (PrEP). You are considered at risk if:  You are a man who has sex with other men (MSM).  You are a heterosexual man who is sexually active with multiple partners.  You take drugs by injection.  You are sexually active with a partner who has HIV.  Talk with your health care provider about whether you are at high risk of being infected with HIV. If you choose to begin PrEP, you should first be tested for HIV. You should then be tested every 3 months for as long as you are taking PrEP.  Use sunscreen. Apply sunscreen liberally and repeatedly throughout the day. You should seek shade when your shadow is shorter than you. Protect yourself by wearing long sleeves, pants, a wide-brimmed hat, and sunglasses year round whenever you are outdoors.  Tell your health  care provider of new moles or changes in moles, especially if there is a change in shape or color. Also, tell your health care provider if a mole is larger than the size of a pencil eraser.  A one-time screening for abdominal aortic aneurysm (AAA) and surgical repair of large AAAs by ultrasound is recommended for men aged 80-75 years who are current or former smokers.  Stay current with your vaccines (immunizations). Document Released: 10/29/2007 Document Revised: 05/07/2013 Document Reviewed: 09/27/2010 [facility] Patient Information 2015 [place], [place]. This information is not intended to replace advice given to you by your health care provider. Make sure you discuss any questions you have with your health care provider.

## 2014-07-22 NOTE — Assessment & Plan Note (Signed)
BP stable today, continue medications as per medication list. Check lab work today for kidney function as adjust as needed. Advised him to start exercising more and trying to lose some weight.

## 2014-07-22 NOTE — Assessment & Plan Note (Signed)
Some pain with activities. Referral to sports medicine for evaluation of his upper back and shoulder pain.

## 2014-07-22 NOTE — Assessment & Plan Note (Signed)
Doing well on current regimen. Continue as per medication list.

## 2014-07-22 NOTE — Progress Notes (Signed)
Pre visit review using our clinic review tool, if applicable. No additional management support is needed unless otherwise documented below in the visit note. 

## 2014-08-05 ENCOUNTER — Ambulatory Visit (INDEPENDENT_AMBULATORY_CARE_PROVIDER_SITE_OTHER)
Admission: RE | Admit: 2014-08-05 | Discharge: 2014-08-05 | Disposition: A | Discharge status: Home / Self Care | Payer: Commercial Managed Care - HMO | Source: Ambulatory Visit | Attending: Family Medicine | Admitting: Family Medicine

## 2014-08-05 ENCOUNTER — Ambulatory Visit (INDEPENDENT_AMBULATORY_CARE_PROVIDER_SITE_OTHER): Payer: Commercial Managed Care - HMO | Admitting: Family Medicine

## 2014-08-05 ENCOUNTER — Encounter: Payer: Self-pay | Admitting: Family Medicine

## 2014-08-05 VITALS — BP 134/80 | HR 67 | Ht 68.0 in | Wt 245.0 lb

## 2014-08-05 DIAGNOSIS — M542 Cervicalgia: Secondary | ICD-10-CM

## 2014-08-05 DIAGNOSIS — M5030 Other cervical disc degeneration, unspecified cervical region: Secondary | ICD-10-CM | POA: Diagnosis not present

## 2014-08-05 MED ORDER — GABAPENTIN 100 MG PO CAPS: 200.0000 mg | ORAL_CAPSULE | Freq: Every day | ORAL | Status: DC

## 2014-08-05 NOTE — Patient Instructions (Addendum)
Good to meet you Ice 20 minutes 2 times daily. Usually after activity and before bed. Exercises 3 times a week.  Daily on wall with heels, butt shoulder and head touching wall for goal of 5 minutes daily Vitamin D 2000 IU daily Fish oil 2 grams daily Consider turmeric 500mg  daily Gabapentin 100mg  at night for 1st week then consider 200mg  nightly thereafter. Xray downstairs today See me again in 3 weeks.

## 2014-08-05 NOTE — Progress Notes (Signed)
  [facility] [address], [place] [address] Phone: [phone] Subjective:    I'm seeing this patient by the request  of:  Gwendolyn Grant, MD   CC: For back pain to bilateral shoulder pain.  [id number] Eugene [name] is a 69 y.o. male coming in with complaint of upper back pain. Patient states that it seems to be going to the bilateral shoulders. Describes it as more of a throbbing aching sensation. Patient has had a history of a cervical neck nerve impingement that did get better with formal physical therapy greater than 15 years ago. Patient states that this feels somewhat similar. States that it is very uncomfortable to get comfortable at night. Patient states that then he has unfortunately some numbness in the hands bilaterally but denies any weakness. States that the numbness is intermittent. Still able to do daily activities. Rates the severity of pain a 7 out of 10. Has not responded well to over-the-counter medications at this time.     Past medical history, social, surgical and family history all reviewed in electronic medical record.   Review of Systems: No headache, visual changes, nausea, vomiting, diarrhea, constipation, dizziness, abdominal pain, skin rash, fevers, chills, night sweats, weight loss, swollen lymph nodes, body aches, joint swelling, muscle aches, chest pain, shortness of breath, mood changes.   Objective Blood pressure 134/80, pulse 67, height 5\' 8"  (1.727 m), weight 245 lb (111.131 kg), SpO2 97 %.  General: No apparent distress alert and oriented x3 mood and affect normal, dressed appropriately.  HEENT: Pupils equal, extraocular movements intact  Respiratory: Patient's speak in full sentences and does not appear short of breath  Cardiovascular: No lower extremity edema, non tender, no erythema  Skin: Warm dry intact with no signs of infection or rash on extremities or on axial skeleton.  Abdomen: Soft nontender    Neuro: Cranial nerves II through XII are intact, neurovascularly intact in all extremities with 2+ DTRs and 2+ pulses.  Lymph: No lymphadenopathy of posterior or anterior cervical chain or axillae bilaterally.  Gait normal with good balance and coordination.  MSK:  Non tender with full range of motion and good stability and symmetric strength and tone of shoulders, elbows, wrist, hip, knee and ankles bilaterally.  Neck: Inspection unremarkable. No palpable stepoffs. Negative Spurling's maneuver. Decreased range of motion lacking the last 10 of extension limits the last 5 of rotation bilaterally. Grip strength and sensation normal in bilateral hands Strength good C4 to T1 distribution No sensory change to C4 to T1 Negative Hoffman sign bilaterally Reflexes normal   Impression and Recommendations:     This case required medical decision making of moderate complexity.

## 2014-08-05 NOTE — Assessment & Plan Note (Signed)
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

## 2014-08-13 ENCOUNTER — Other Ambulatory Visit: Payer: Self-pay | Admitting: Internal Medicine

## 2014-08-22 ENCOUNTER — Ambulatory Visit (INDEPENDENT_AMBULATORY_CARE_PROVIDER_SITE_OTHER): Payer: Commercial Managed Care - HMO | Admitting: Internal Medicine

## 2014-08-22 ENCOUNTER — Encounter: Payer: Self-pay | Admitting: Internal Medicine

## 2014-08-22 VITALS — BP 128/62 | HR 74 | Ht 69.5 in | Wt 245.0 lb

## 2014-08-22 DIAGNOSIS — J302 Other seasonal allergic rhinitis: Secondary | ICD-10-CM

## 2014-08-22 DIAGNOSIS — J3089 Other allergic rhinitis: Secondary | ICD-10-CM

## 2014-08-22 DIAGNOSIS — J309 Allergic rhinitis, unspecified: Secondary | ICD-10-CM | POA: Diagnosis not present

## 2014-08-22 DIAGNOSIS — G4733 Obstructive sleep apnea (adult) (pediatric): Secondary | ICD-10-CM

## 2014-08-22 MED ORDER — PHENYLEPHRINE HCL 1 % NA SOLN: 3.0000 [drp] | Freq: Once | NASAL | Status: DC

## 2014-08-22 MED ORDER — FLUTICASONE PROPIONATE 50 MCG/ACT NA SUSP: NASAL | Status: DC

## 2014-08-22 MED ORDER — METHYLPREDNISOLONE ACETATE 80 MG/ML IJ SUSP
80.0000 mg | Freq: Once | INTRAMUSCULAR | Status: AC
  Administered 2014-08-22: 80 mg via INTRAMUSCULAR

## 2014-08-22 NOTE — Assessment & Plan Note (Signed)
Acute seasonal exacerbation not controlled with azelastine nasal spray and interfering with his CPAP. Plan-nasal nebulizer decongestant, Depo-Medrol, continue azelastine nasal spray, add regular use of Flonase as discussed

## 2014-08-22 NOTE — Progress Notes (Signed)
09/15/11- 19 yoM never smoker self referred for allergy evaluation. Former patient at [facility]. Now followed here by Dr Gwenette Greet for OSA. Old records being obtained. He complains that for 4 or 5 months he has had numbness in the right side of his face "like at dentist". Right eye burns and walkers. The vision is okay and hearing okay. No discomfort with his teeth. This sensation has been persistent without change. Dr Gerald Stabs Newman/ ENT did CT scan of sinuses on 04/01/2011 describing mild mucosal thickening with no air-fluid levels. 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 No mass. He says nasal congestion is such that he has to mouth breathe but congestion shifts back and forth. He denies ENT trauma or surgery. Has been taking Astelin and fluticasone nasal sprays with little benefit. 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  10/20/11- 65 yoM never smoker self referred for allergy evaluation. Former patient at [facility]. Now followed here by Dr Gwenette Greet for OSA. At last visit we gave him a nasal nebulizer treatment and sample of Dymista, which seemed to help. When the sample ran out, he went back to [facility] which doesn't work as well. He is just using Astelin now. Last week he had a viral chest cold with hard cough since then has had a dull persistent substernal ache increased by reaching or lifting with his arms. Still has some productive cough with white sputum. Some wheeze.  02/20/12- 10 yoM never smoker followed by [facility] for allergy . Former patient at [facility]. Now followed here by Dr Gwenette Greet for OSA.  last Thursday had sinus headache and could not get rid of; overall has good and bad days; congestion at times. He had been outdoors all day and the next day developed persistent frontal headache. Headache is now clear but he has residual nasal congestion. Dymista nasal spray has been some help. He asks about chronic coated tongue despite rush but with  his toothbrush and using mouthwash. He is not diabetic. Aware of postnasal drip.  08/20/12- 66 yoM never smoker followed by [facility] for allergic rhinitis . Former patient at [facility]. Now followed here by Dr Gwenette Greet for OSA. FOLLOWS FOR: bloody nasal drainage for about past 2 months-comes and goes(mornings are normally the worst time for patient). Using Astelin and loratadine potential for overdrying. Tongue is coated. Remains on CPAP with humidifier, supervised by Dr. Gwenette Greet. Stopped allergy vaccine 1:10 GO  02/18/13- 67 yoM never smoker followed by [facility] for allergic rhinitis . Former patient at [facility]. Now followed here by Dr Gwenette Greet for OSA/ CPAP. FOLLOWS FOR: Has "clogged" days and others "feels clear" Stopped allergy vaccine 1:10 GO Shifting nasal stuffiness usually worse on R side despite otc decongestants. Ears ok.  Known septal deviation and mild chronic sinusitis on CT maxfac 2012. Had unsatisfactory visit with Dr Lucia Gaskins then- never heard back.  08/19/13- 67 yoM never smoker followed by [facility] for allergic rhinitis . Former patient at [facility]. Now followed here by Dr Gwenette Greet for OSA/ CPAP. FOLLOWS FOR: sinus pressure since mid March 2015-has not been on allergy vaccine in years. Saw Dr. Lovenia Shuck about persistent nasal stuffiness. He was not a surgical candidate but told to use decongestant spray just at night. He is not having much seasonal itching or sneezing but uses Astelin nasal spray before he goes outdoors  08/22/14-  68 yoM never smoker followed by [facility] for allergic rhinitis, hx chronic sinusitis . Former patient at [facility]. FOLLOW FOR:  allergies.  very congested x 1 month.  sinus pressure, sinus congestion.   Increased nasal congestion  especially within the past 7-10 days blamed on pollen Using azelastine nasal spray. No chest tightness or wheeze. No headache or purulent discharge.  ROS-see HPI Constitutional:   No-   weight loss, night sweats, fevers, chills, fatigue, lassitude. HEENT:   No-   headaches, difficulty swallowing, tooth/dental problems, sore throat,       No- sneezing, itching, ear ache, +nasal congestion, no-post nasal drip,  CV:  No-   chest pain, orthopnea, PND, swelling in lower extremities, anasarca, dizziness, palpitations Resp: No-   shortness of breath with exertion or at rest.              No-   productive cough,  No non-productive cough,  No- coughing up of blood.              No-   change in color of mucus.  No- wheezing.   Skin: No-   rash or lesions. GI:  No-   heartburn, indigestion, abdominal pain, nausea, vomiting,  GU:   MS:  No-   joint pain or swelling.   Neuro-     nothing unusual Psych:  No- change in mood or affect. No depression or anxiety.  No memory loss.  OBJ- Physical Exam General- Alert, Oriented, Affect-appropriate, Distress- none acute.  Skin- rash-none, lesions- none, excoriation- none Lymphadenopathy- none Head- atraumatic            Eyes- Gross vision intact, PERRLA, conjunctivae and secretions clear,               + periorbital edema            Ears- Hearing, canals-normal            Nose-  + marked turbinate edema, +mild septal dev, No- mucus, polyps, erosion, perforation.             Throat- Mallampati III-IV , mucosa-clear , drainage- none, tonsils- atrophic Neck- flexible , trachea midline, no stridor , thyroid nl, carotid no bruit Chest - symmetrical excursion , unlabored           Heart/CV- RRR , no murmur , no gallop  , no rub, nl s1 s2                           - JVD- none , edema- none, stasis changes- none, varices- none           Lung- clear to P&A, no-wheeze, cough- none , dullness-none, rub- none           Chest wall-  Abd-  Br/ Gen/ Rectal- Not done, not indicated Extrem- cyanosis- none, clubbing, none, atrophy- none, strength- nl Neuro- grossly intact to observation.

## 2014-08-22 NOTE — Patient Instructions (Addendum)
Neb neo nasal  Depo 80  Continue azelastine nasal spray once or twice daily while needed  Add back Flonase/ fluticasone otc nasal spray  1-2 puffs each nostril once daily at bedtime  Please call if ths doesn't keep you controlled better

## 2014-08-22 NOTE — Assessment & Plan Note (Signed)
He indicates good compliance and comfortable control. His current nasal congestion is preventing comfortable use this past week.

## 2014-08-25 ENCOUNTER — Telehealth: Payer: Self-pay | Admitting: Internal Medicine

## 2014-08-25 MED ORDER — FLUTICASONE PROPIONATE 50 MCG/ACT NA SUSP: NASAL | Status: DC

## 2014-08-25 NOTE — Telephone Encounter (Signed)
Rx has been sent in. Pt is aware. Nothing further was needed. 

## 2014-08-26 ENCOUNTER — Encounter: Payer: Self-pay | Admitting: Family Medicine

## 2014-08-26 ENCOUNTER — Ambulatory Visit (INDEPENDENT_AMBULATORY_CARE_PROVIDER_SITE_OTHER): Payer: Commercial Managed Care - HMO | Admitting: Family Medicine

## 2014-08-26 VITALS — BP 128/72 | HR 71 | Ht 68.0 in | Wt 242.0 lb

## 2014-08-26 DIAGNOSIS — M5030 Other cervical disc degeneration, unspecified cervical region: Secondary | ICD-10-CM | POA: Diagnosis not present

## 2014-08-26 NOTE — Progress Notes (Signed)
  [facility] [address], [place] [address] Phone: [phone] Subjective:    I'm seeing this patient by the request  of:  Gwendolyn Grant, MD   CC: For back pain to bilateral shoulder pain.  Eugene [name] is a 69 y.o. male coming in with complaint of upper back pain. Patient states that it seems to be going to the bilateral shoulders. 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 Patient was given conservative therapy including home exercises, icing protocol, over-the-counter medications as well as shin is on a low dose gabapentin. Patient states he is about 50% better. Patient states that he is not having any side effects to the over-the-counter medicine is continuing the gabapentin 200 mg at night. Patient still states that the nighttime pain seems to be worse. Patient describes it as more of a dull carving aching sensation. Denies any numbness or tingling. Denies any significant radiation down the arms but states that his index finger on his left hand can sometimes get numb. This is somewhat of a new finding and seems to be a little more regular.    Past medical history, social, surgical and family history all reviewed in electronic medical record.   Review of Systems: No headache, visual changes, nausea, vomiting, diarrhea, constipation, dizziness, abdominal pain, skin rash, fevers, chills, night sweats, weight loss, swollen lymph nodes, body aches, joint swelling, muscle aches, chest pain, shortness of breath, mood changes.   Objective Blood pressure 128/72, pulse 71, height 5\' 8"  (1.727 m), weight 242 lb (109.77 kg), SpO2 97 %.  General: No apparent distress alert and oriented x3 mood and affect normal, dressed appropriately.  HEENT: Pupils equal, extraocular movements intact  Respiratory: Patient's speak in full sentences and does not appear short of breath  Cardiovascular: No lower extremity edema, non  tender, no erythema  Skin: Warm dry intact with no signs of infection or rash on extremities or on axial skeleton.  Abdomen: Soft nontender  Neuro: Cranial nerves II through XII are intact, neurovascularly intact in all extremities with 2+ DTRs and 2+ pulses.  Lymph: No lymphadenopathy of posterior or anterior cervical chain or axillae bilaterally.  Gait normal with good balance and coordination.  MSK:  Non tender with full range of motion and good stability and symmetric strength and tone of shoulders, elbows, wrist, hip, knee and ankles bilaterally.  Neck: Inspection unremarkable. No palpable stepoffs. Negative Spurling's maneuver. Decreased range of motion lacking the last 10 of extension limits the last 5 of rotation bilaterally. No change from previous exam Grip strength and sensation normal in bilateral hands Strength good C4 to T1 distribution No sensory change to C4 to T1 Negative Hoffman sign bilaterally Reflexes normal   Impression and Recommendations:     This case required medical decision making of moderate complexity.

## 2014-08-26 NOTE — Progress Notes (Signed)
Pre visit review using our clinic review tool, if applicable. No additional management support is needed unless otherwise documented below in the visit note. 

## 2014-08-26 NOTE — Patient Instructions (Signed)
Good to see you Ice is still your friend Continue the vitamins Continue the exercises you are doing good New exercises for you finger 3 times a week.  Increase gabapentin 300mg  at night Keep it up See me again in 4-6 weeks. If worsening though call me and if pain is worse we would consider an MRI.

## 2014-08-26 NOTE — Assessment & Plan Note (Signed)
Patient is responding somewhat to conservative therapy. We discussed the possibility of formal physical therapy which patient declined. We will continue the same medications as well as actually increasing patient's gabapentin to 300 mg at night. We discussed icing regimen as well as new postural changes that I think will be beneficial. Patient and will come back and see me again in 3-4 weeks. We discussed if there is any worsening pain in the interim then we may went to consider advanced imaging and patient will call if this occurs. Patient otherwise will see me again in 3-4 weeks.  Spent  25 minutes with patient face-to-face and had greater than 50% of counseling including as described above in assessment and plan.

## 2014-09-25 ENCOUNTER — Encounter: Payer: Commercial Managed Care - HMO | Admitting: Internal Medicine

## 2014-09-28 ENCOUNTER — Other Ambulatory Visit: Payer: Self-pay | Admitting: Internal Medicine

## 2014-10-07 ENCOUNTER — Ambulatory Visit (INDEPENDENT_AMBULATORY_CARE_PROVIDER_SITE_OTHER): Payer: Commercial Managed Care - HMO | Admitting: Family Medicine

## 2014-10-07 ENCOUNTER — Encounter: Payer: Self-pay | Admitting: Family Medicine

## 2014-10-07 VITALS — BP 130/68 | HR 68 | Ht 68.0 in | Wt 250.0 lb

## 2014-10-07 DIAGNOSIS — M5030 Other cervical disc degeneration, unspecified cervical region: Secondary | ICD-10-CM

## 2014-10-07 NOTE — Progress Notes (Signed)
  [facility] [place], [place] [address] Phone: [phone] Subjective:    I'm seeing this patient by the request  of:  Gwendolyn Grant, MD   CC: For back pain to bilateral shoulder pain.  WLN:[id number] Eugene [name] is a 69 y.o. male coming in with complaint of upper back pain. Patient states that it seems to be going to the bilateral shoulders. Patient is doing much better overall. Discussed icing regimen which patient is doing very well. Patient states that he is approximately 75% better. No radicular symptoms any more. No side effects to the gabapentin. Patient is happy with the results.    Past medical history, social, surgical and family history all reviewed in electronic medical record.   Review of Systems: No headache, visual changes, nausea, vomiting, diarrhea, constipation, dizziness, abdominal pain, skin rash, fevers, chills, night sweats, weight loss, swollen lymph nodes, body aches, joint swelling, muscle aches, chest pain, shortness of breath, mood changes.   Objective Blood pressure 130/68, pulse 68, height 5\' 8"  (1.727 m), weight 250 lb (113.399 kg), SpO2 97 %.  General: No apparent distress alert and oriented x3 mood and affect normal, dressed appropriately.  HEENT: Pupils equal, extraocular movements intact  Respiratory: Patient's speak in full sentences and does not appear short of breath  Cardiovascular: No lower extremity edema, non tender, no erythema  Skin: Warm dry intact with no signs of infection or rash on extremities or on axial skeleton.  Abdomen: Soft nontender  Neuro: Cranial nerves II through XII are intact, neurovascularly intact in all extremities with 2+ DTRs and 2+ pulses.  Lymph: No lymphadenopathy of posterior or anterior cervical chain or axillae bilaterally.  Gait normal with good balance and coordination.  MSK:  Non tender with full range of motion and good stability and symmetric strength and tone of  shoulders, elbows, wrist, hip, knee and ankles bilaterally.  Neck: Inspection unremarkable. No palpable stepoffs. Negative Spurling's maneuver. Decreased range of motion lacking the last 5 of extension limits the last 5 of rotation bilaterally. Mild improvement from previous exam Grip strength and sensation normal in bilateral hands Strength good C4 to T1 distribution No sensory change to C4 to T1 Negative Hoffman sign bilaterally Reflexes normal   Impression and Recommendations:     This case required medical decision making of moderate complexity.

## 2014-10-07 NOTE — Assessment & Plan Note (Signed)
Encouraged by patient's response. Patient is doing very well with conservative therapy. Patient does have medications for breakthrough pain and will continue with the gabapentin at night. We discussed icing regimen and continuing the home exercises. Patient will continue with the natural supplementations. Patient come back and see me again in 6 weeks if any pain still has not resolved. Patient has any worsening symptoms I would consider formal physical therapy as well as possible epidural injections.

## 2014-10-07 NOTE — Progress Notes (Signed)
Pre visit review using our clinic review tool, if applicable. No additional management support is needed unless otherwise documented below in the visit note. 

## 2014-10-07 NOTE — Patient Instructions (Signed)
Good to see you You are doing great! Heat before activity and ice afterward Continue the exercises 3 times a week Continue the vitamins See me when you need me.

## 2014-10-30 ENCOUNTER — Ambulatory Visit (INDEPENDENT_AMBULATORY_CARE_PROVIDER_SITE_OTHER): Payer: Commercial Managed Care - HMO | Admitting: Internal Medicine

## 2014-10-30 ENCOUNTER — Encounter: Payer: Self-pay | Admitting: Internal Medicine

## 2014-10-30 VITALS — BP 110/68 | HR 64 | Ht 69.0 in | Wt 250.0 lb

## 2014-10-30 DIAGNOSIS — J3089 Other allergic rhinitis: Secondary | ICD-10-CM

## 2014-10-30 DIAGNOSIS — G4733 Obstructive sleep apnea (adult) (pediatric): Secondary | ICD-10-CM

## 2014-10-30 DIAGNOSIS — J309 Allergic rhinitis, unspecified: Secondary | ICD-10-CM | POA: Diagnosis not present

## 2014-10-30 DIAGNOSIS — J302 Other seasonal allergic rhinitis: Secondary | ICD-10-CM

## 2014-10-30 NOTE — Patient Instructions (Signed)
Ok to continue [facility] daily , and to add aszelastine when you need some more help for your nose.  We can continue CPAP/ [facility]  Order- DME [facility] request CPAP download for pressure compliance  We discussed availability of sub-lingual "SLIT" immunotherapy pills to reduce grass and ragweed pollen allergy. Now would be the time to start Ragwitek to try to ease ragweed pollen allergy for the fall. Let us know if you want to try it.

## 2014-10-30 NOTE — Progress Notes (Addendum)
09/15/11- 15 yoM never smoker self referred for allergy evaluation. Former patient at [facility]. Now followed here by Dr Gwenette Greet for OSA. Old records being obtained. He complains that for 4 or 5 months he has had numbness in the right side of his face "like at dentist". Right eye burns and walkers. The vision is okay and hearing okay. No discomfort with his teeth. This sensation has been persistent without change. Dr Gerald Stabs Newman/ ENT did CT scan of sinuses on 04/01/2011 describing mild mucosal thickening with no air-fluid levels. 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 No mass. He says nasal congestion is such that he has to mouth breathe but congestion shifts back and forth. He denies ENT trauma or surgery. Has been taking Astelin and fluticasone nasal sprays with little benefit. 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  10/20/11- 54 yoM never smoker self referred for allergy evaluation. Former patient at [facility]. Now followed here by Dr Gwenette Greet for OSA. At last visit we gave him a nasal nebulizer treatment and sample of Dymista, which seemed to help. When the sample ran out, he went back to [facility] which doesn't work as well. He is just using Astelin now. Last week he had a viral chest cold with hard cough since then has had a dull persistent substernal ache increased by reaching or lifting with his arms. Still has some productive cough with white sputum. Some wheeze.  02/20/12- 69 yoM never smoker followed by CDY for allergy . Former patient at [facility]. Now followed here by Dr Gwenette Greet for OSA.  last Thursday had sinus headache and could not get rid of; overall has good and bad days; congestion at times. He had been outdoors all day and the next day developed persistent frontal headache. Headache is now clear but he has residual nasal congestion. Dymista nasal spray has been some help. He asks about chronic coated tongue despite rush but with  his toothbrush and using mouthwash. He is not diabetic. Aware of postnasal drip.  08/20/12- 66 yoM never smoker followed by CDY for allergic rhinitis . Former patient at [facility]. Now followed here by Dr Gwenette Greet for OSA. FOLLOWS FOR: bloody nasal drainage for about past 2 months-comes and goes(mornings are normally the worst time for patient). Using Astelin and loratadine potential for overdrying. Tongue is coated. Remains on CPAP with humidifier, supervised by Dr. Gwenette Greet. Stopped allergy vaccine 1:10 GO  02/18/13- 67 yoM never smoker followed by CDY for allergic rhinitis . Former patient at [facility]. Now followed here by Dr Gwenette Greet for OSA/ CPAP. FOLLOWS FOR: Has "clogged" days and others "feels clear" Stopped allergy vaccine 1:10 GO Shifting nasal stuffiness usually worse on R side despite otc decongestants. Ears ok.  Known septal deviation and mild chronic sinusitis on CT maxfac 2012. Had unsatisfactory visit with Dr Lucia Gaskins then- never heard back.  08/19/13- 67 yoM never smoker followed by CDY for allergic rhinitis . Former patient at [facility]. Now followed here by Dr Gwenette Greet for OSA/ CPAP. FOLLOWS FOR: sinus pressure since mid March 2015-has not been on allergy vaccine in years. Saw Dr. Lovenia Shuck about persistent nasal stuffiness. He was not a surgical candidate but told to use decongestant spray just at night. He is not having much seasonal itching or sneezing but uses Astelin nasal spray before he goes outdoors  08/22/14-  68 yoM never smoker followed by CDY for allergic rhinitis, hx chronic sinusitis . Former patient at [facility]. FOLLOW FOR:  allergies.  very congested x 1 month.  sinus pressure, sinus congestion.   Increased nasal congestion  especially within the past 7-10 days blamed on pollen Using azelastine nasal spray. No chest tightness or wheeze. No headache or purulent discharge.  10/30/14- 68 yoM never smoker followed by CDY for allergic rhinitis, OSA, hx chronic sinusitis . Former patient at  [facility]. Follows For: Pt states allergies have improved. Pt states flonase and azelastine nasal spray have helped.  CPAP Auto/ [facility] doing fine.  Rhinitis controlled much better after allergy season. Patient's meds help. We discussed availability of sublingual immunotherapy =future consideration.  ROS-see HPI Constitutional:   No-   weight loss, night sweats, fevers, chills, fatigue, lassitude. HEENT:   No-  headaches, difficulty swallowing, tooth/dental problems, sore throat,       No- sneezing, itching, ear ache, +nasal congestion, no-post nasal drip,  CV:  No-   chest pain, orthopnea, PND, swelling in lower extremities, anasarca, dizziness, palpitations Resp: No-   shortness of breath with exertion or at rest.              No-   productive cough,  No non-productive cough,  No- coughing up of blood.              No-   change in color of mucus.  No- wheezing.   Skin: No-   rash or lesions. GI:  No-   heartburn, indigestion, abdominal pain, nausea, vomiting,  GU:   MS:  No-   joint pain or swelling.   Neuro-     nothing unusual Psych:  No- change in mood or affect. No depression or anxiety.  No memory loss.  OBJ- Physical Exam General- Alert, Oriented, Affect-appropriate, Distress- none acute.  Skin- rash-none, lesions- none, excoriation- none Lymphadenopathy- none Head- atraumatic            Eyes- Gross vision intact, PERRLA, conjunctivae and secretions clear,               + periorbital edema            Ears- Hearing, canals-normal            Nose-  + marked turbinate edema, +mild septal dev, No- mucus, polyps, erosion, perforation.             Throat- Mallampati III-IV , mucosa-clear , drainage- none, tonsils- atrophic Neck- flexible , trachea midline, no stridor , thyroid nl, carotid no bruit Chest - symmetrical excursion , unlabored           Heart/CV- RRR , no murmur , no gallop  , no rub, nl s1 s2                           - JVD- none , edema- none, stasis changes- none, varices-  none           Lung- clear to P&A, no-wheeze, cough- none , dullness-none, rub- none           Chest wall-  Abd-  Br/ Gen/ Rectal- Not done, not indicated Extrem- cyanosis- none, clubbing, none, atrophy- none, strength- nl Neuro- grossly intact to observation.

## 2014-11-10 ENCOUNTER — Telehealth: Payer: Self-pay | Admitting: Family Medicine

## 2014-11-10 NOTE — Telephone Encounter (Signed)
Spoke to pt, he was calling about his insurance not covering his last OV. I advised the pt we do not handle any of the billing & that he would need to call our billing dept. Provided pt with their number.

## 2014-11-10 NOTE — Telephone Encounter (Signed)
Patient stated that a code was sent to the wrong place, he didn't know what the code was are where it was sent, he ask if someone could call him please

## 2014-11-20 ENCOUNTER — Other Ambulatory Visit: Payer: Self-pay | Admitting: Internal Medicine

## 2014-11-27 NOTE — Assessment & Plan Note (Signed)
He is comfortable now but may need more help during peak allergy seasons. We will watch this. He continues Flonase and azelastine nasal sprays

## 2014-11-27 NOTE — Assessment & Plan Note (Signed)
He is satisfied with current settings. Sleeps better and snoring is prevented. Plan-download for pressure compliance documentation

## 2014-12-01 ENCOUNTER — Encounter: Payer: Self-pay | Admitting: Internal Medicine

## 2014-12-01 ENCOUNTER — Ambulatory Visit (INDEPENDENT_AMBULATORY_CARE_PROVIDER_SITE_OTHER): Payer: Commercial Managed Care - HMO | Admitting: Internal Medicine

## 2014-12-01 ENCOUNTER — Other Ambulatory Visit (INDEPENDENT_AMBULATORY_CARE_PROVIDER_SITE_OTHER): Payer: Commercial Managed Care - HMO

## 2014-12-01 VITALS — BP 122/72 | HR 71 | Temp 97.7°F | Ht 68.0 in | Wt 248.5 lb

## 2014-12-01 DIAGNOSIS — G4733 Obstructive sleep apnea (adult) (pediatric): Secondary | ICD-10-CM | POA: Diagnosis not present

## 2014-12-01 DIAGNOSIS — Z1159 Encounter for screening for other viral diseases: Secondary | ICD-10-CM

## 2014-12-01 DIAGNOSIS — I10 Essential (primary) hypertension: Secondary | ICD-10-CM

## 2014-12-01 DIAGNOSIS — Z23 Encounter for immunization: Secondary | ICD-10-CM

## 2014-12-01 DIAGNOSIS — R208 Other disturbances of skin sensation: Secondary | ICD-10-CM

## 2014-12-01 DIAGNOSIS — M5030 Other cervical disc degeneration, unspecified cervical region: Secondary | ICD-10-CM

## 2014-12-01 DIAGNOSIS — R200 Anesthesia of skin: Secondary | ICD-10-CM

## 2014-12-01 LAB — CBC WITH DIFFERENTIAL/PLATELET
BASOS ABS: 0 10*3/uL (ref 0.0–0.1)
Basophils Relative: 0.4 % (ref 0.0–3.0)
EOS PCT: 1.9 % (ref 0.0–5.0)
Eosinophils Absolute: 0.2 10*3/uL (ref 0.0–0.7)
HCT: 38.7 % — ABNORMAL LOW (ref 39.0–52.0)
Hemoglobin: 13.1 g/dL (ref 13.0–17.0)
Lymphocytes Relative: 40 % (ref 12.0–46.0)
Lymphs Abs: 3.3 10*3/uL (ref 0.7–4.0)
MCHC: 34 g/dL (ref 30.0–36.0)
MCV: 100.7 fl — ABNORMAL HIGH (ref 78.0–100.0)
MONOS PCT: 8.3 % (ref 3.0–12.0)
Monocytes Absolute: 0.7 10*3/uL (ref 0.1–1.0)
NEUTROS ABS: 4.1 10*3/uL (ref 1.4–7.7)
Neutrophils Relative %: 49.4 % (ref 43.0–77.0)
Platelets: 141 10*3/uL — ABNORMAL LOW (ref 150.0–400.0)
RBC: 3.85 Mil/uL — AB (ref 4.22–5.81)
RDW: 12.5 % (ref 11.5–15.5)
WBC: 8.3 10*3/uL (ref 4.0–10.5)

## 2014-12-01 LAB — VITAMIN B12: Vitamin B-12: 813 pg/mL (ref 211–911)

## 2014-12-01 NOTE — Assessment & Plan Note (Addendum)
Persisitng symptoms of neck, shoulder discomfort - radiating into B hands with numbness and now B feet. ?cervical myelopathy - not clear weakness on exam but hx concerning for same Check labs to exclude metabolic neuropathy and refer for MRI neck. Also refer to NSurg for review and opinion on same. Continue gabapentin and robaxin pending results. Also follow up sports med prn

## 2014-12-01 NOTE — Progress Notes (Signed)
Pre visit review using our clinic review tool, if applicable. No additional management support is needed unless otherwise documented below in the visit note. 

## 2014-12-01 NOTE — Progress Notes (Signed)
Subjective:    Patient ID: Eugene [name], male    DOB: January 14, 1946, 69 y.o.   MRN: [id number]  HPI  Patient here for follow-up. Reviewed chronic conditions, interval events and current concerns.  Past Medical History  Diagnosis Date  . Hypertension   . Rheumatic fever     residual pulm stenosis, follows with ganji every 60mo  . Anxiety   . Allergic rhinitis due to pollen   . ED (erectile dysfunction)   . OBESITY   . Osteoarthritis   . RHINITIS, CHRONIC   . SLEEP APNEA, OBSTRUCTIVE dx 2000    NPSG 2000:  AHI 76/hr CPAP titrated to 11cm 2002   . Pulmonary stenosis, valvar     TEE 07/2011: stable, LVEF 55%, small PFO  . Refusal of blood transfusions as patient is Jehovah's Witness   . DDD (degenerative disc disease), cervical     Severe w/ osteophytes on xray 3/16    Review of Systems  Respiratory: Negative for cough and shortness of breath.   Musculoskeletal: Positive for neck pain (radiating into R>L arms/shoulder) and neck stiffness. Negative for back pain and gait problem.  Neurological: Positive for weakness (?B hand grip when opening things) and numbness (B hands and forearms, now B feet). Negative for dizziness, tremors, seizures, facial asymmetry and headaches.       Objective:    Physical Exam  Constitutional: He is oriented to person, place, and time. He appears well-developed and well-nourished. No distress.  obese  Neck: Neck supple.  Cardiovascular: Normal rate, regular rhythm and normal heart sounds.   No murmur heard. Pulmonary/Chest: Effort normal and breath sounds normal. No respiratory distress.  Musculoskeletal: Normal range of motion.  Neurological: He is alert and oriented to person, place, and time. No cranial nerve deficit. He exhibits normal muscle tone. Coordination normal.  Psychiatric: He has a normal mood and affect. His behavior is normal. Thought content normal.    BP 122/72 mmHg  Pulse 71  Temp(Src) 97.7 F (36.5 C) (Oral)  Ht 5\' 8"   (1.727 m)  Wt 248 lb 8 oz (112.719 kg)  BMI 37.79 kg/m2  SpO2 98% Wt Readings from Last 3 Encounters:  12/01/14 248 lb 8 oz (112.719 kg)  10/30/14 250 lb (113.399 kg)  10/07/14 250 lb (113.399 kg)    Lab Results  Component Value Date   WBC 7.5 09/17/2013   HGB 13.6 09/17/2013   HCT 40.6 09/17/2013   PLT 146.0* 09/17/2013   GLUCOSE 90 07/22/2014   CHOL 145 07/22/2014   TRIG 99.0 07/22/2014   HDL 42.30 07/22/2014   LDLCALC 83 07/22/2014   ALT 23 07/22/2014   AST 36 07/22/2014   NA 141 07/22/2014   K 4.2 07/22/2014   CL 105 07/22/2014   CREATININE 1.55* 07/22/2014   BUN 26* 07/22/2014   CO2 33* 07/22/2014   TSH 0.58 07/22/2014   PSA 1.73 09/18/2012   HGBA1C 4.6 07/22/2014   No results found for: VITAMINB12  Dg Cervical Spine Complete  08/05/2014   CLINICAL DATA:  Neck pain for 5 days.  EXAM: CERVICAL SPINE  4+ VIEWS  COMPARISON:  None.  FINDINGS: Loss of normal cervical lordosis is present. Multilevel disc space loss and endplate osteophyte formation noted, particularly severe at C3-C4. Multi level all neural foraminal narrowing noted particularly severe at the left C3-C4, C4-C5, and C5-C6 foramen. No evidence of fracture or dislocation. Pulmonary apices are clear.  IMPRESSION: Loss of normal cervical lordosis. This is most likely related  to severe degenerative change present. Severe diffuse degenerative change noted with severe multilevel bilateral neural foraminal narrowing particularly on the left at C3-C4, C4-C5, and C5-C6.   Electronically Signed   By: Marcello Moores  Register   On: 08/05/2014 09:43       Assessment & Plan:    Problem List Items Addressed This Visit    Degenerative cervical disc - Primary    Persisitng symptoms of neck, shoulder discomfort - radiating into B hands with numbness and now B feet. ?cervical myelopathy - not clear weakness on exam but hx concerning for same Check labs to exclude metabolic neuropathy and refer for MRI neck. Also refer to NSurg for  review and opinion on same. Continue gabapentin and robaxin pending results. Also follow up sports med prn      Relevant Orders   MR Cervical Spine Wo Contrast   Ambulatory referral to Neurosurgery   Essential hypertension    BP Readings from Last 3 Encounters:  12/01/14 122/72  10/30/14 110/68  10/07/14 130/68  changed losartan hctz to BID at pt request 09/2013, as prefers bid to qd dosing - The current medical regimen is effective;  continue present plan and medications.       Obstructive sleep apnea    Working with pulm for same - on CPAP and improved symptoms The current medical regimen is effective;  continue present plan and medications.       Other Visit Diagnoses    Bilateral hand numbness        Relevant Orders    MR Cervical Spine Wo Contrast    Ambulatory referral to Neurosurgery    CBC with Differential/Platelet    Vitamin B12    Need for hepatitis C screening test        Relevant Orders    Hepatitis C antibody        Gwendolyn Grant, MD

## 2014-12-01 NOTE — Assessment & Plan Note (Signed)
BP Readings from Last 3 Encounters:  12/01/14 122/72  10/30/14 110/68  10/07/14 130/68  changed losartan hctz to BID at pt request 09/2013, as prefers bid to qd dosing - The current medical regimen is effective;  continue present plan and medications.

## 2014-12-01 NOTE — Assessment & Plan Note (Signed)
Working with pulm for same - on CPAP and improved symptoms The current medical regimen is effective;  continue present plan and medications.

## 2014-12-01 NOTE — Patient Instructions (Addendum)
It was good to see you today.  We have reviewed your prior records including labs and tests today  Prevnar 13, second pneumonia vaccine, administered today.  Test(s) ordered today. Your results will be released to [facility] (or called to you) after review, usually within 72hours after test completion. If any changes need to be made, you will be notified at that same time.  Medications reviewed and updated, no changes recommended at this time.  we'll make referral to neurosurgery after obtaining MRI of neck to further evaluate your pain and numbness symptoms. Our office will contact you regarding appointment(s) once made.  Please schedule followup in 6 months, call sooner if problems.  Degenerative Disk Disease Degenerative disk disease is a condition caused by the changes that occur in the cushions of the backbone (spinal disks) as you grow older. Spinal disks are soft and compressible disks located between the bones of the spine (vertebrae). They act like shock absorbers. Degenerative disk disease can affect the whole spine. However, the neck and lower back are most commonly affected. Many changes can occur in the spinal disks with aging, such as:  The spinal disks may dry and shrink.  Small tears may occur in the tough, outer covering of the disk (annulus).  The disk space may become smaller due to loss of water.  Abnormal growths in the bone (spurs) may occur. This can put pressure on the nerve roots exiting the spinal canal, causing pain.  The spinal canal may become narrowed. CAUSES  Degenerative disk disease is a condition caused by the changes that occur in the spinal disks with aging. The exact cause is not known, but there is a genetic basis for many patients. Degenerative changes can occur due to loss of fluid in the disk. This makes the disk thinner and reduces the space between the backbones. Small cracks can develop in the outer layer of the disk. This can lead to the breakdown  of the disk. You are more likely to get degenerative disk disease if you are overweight. Smoking cigarettes and doing heavy work such as weightlifting can also increase your risk of this condition. Degenerative changes can start after a sudden injury. Growth of bone spurs can compress the nerve roots and cause pain.  SYMPTOMS  The symptoms vary from person to person. Some people may have no pain, while others have severe pain. The pain may be so severe that it can limit your activities. The location of the pain depends on the part of your backbone that is affected. You will have neck or arm pain if a disk in the neck area is affected. You will have pain in your back, buttocks, or legs if a disk in the lower back is affected. The pain becomes worse while bending, reaching up, or with twisting movements. The pain may start gradually and then get worse as time passes. It may also start after a major or minor injury. You may feel numbness or tingling in the arms or legs.  DIAGNOSIS  Your caregiver will ask you about your symptoms and about activities or habits that may cause the pain. He or she may also ask about any injuries, diseases, or treatments you have had earlier. Your caregiver will examine you to check for the range of movement that is possible in the affected area, to check for strength in your extremities, and to check for sensation in the areas of the arms and legs supplied by different nerve roots. An X-ray of the  spine may be taken. Your caregiver may suggest other imaging tests, such as magnetic resonance imaging (MRI), if needed.  TREATMENT  Treatment includes rest, modifying your activities, and applying ice and heat. Your caregiver may prescribe medicines to reduce your pain and may ask you to do some exercises to strengthen your back. In some cases, you may need surgery. You and your caregiver will decide on the treatment that is best for you. HOME CARE INSTRUCTIONS   Follow proper lifting  and walking techniques as advised by your caregiver.  Maintain good posture.  Exercise regularly as advised.  Perform relaxation exercises.  Change your sitting, standing, and sleeping habits as advised. Change positions frequently.  Lose weight as advised.  Stop smoking if you smoke.  Wear supportive footwear. SEEK MEDICAL CARE IF:  Your pain does not go away within 1 to 4 weeks. SEEK IMMEDIATE MEDICAL CARE IF:   Your pain is severe.  You notice weakness in your arms, hands, or legs.  You begin to lose control of your bladder or bowel movements. MAKE SURE YOU:   Understand these instructions.  Will watch your condition.  Will get help right away if you are not doing well or get worse. Document Released: 02/27/2007 Document Revised: 07/25/2011 Document Reviewed: 09/03/2013 [facility] Patient Information 2015 [place], [place]. This information is not intended to replace advice given to you by your health care provider. Make sure you discuss any questions you have with your health care provider.

## 2014-12-02 LAB — HEPATITIS C ANTIBODY: HCV AB: NEGATIVE

## 2014-12-08 ENCOUNTER — Ambulatory Visit
Admission: RE | Admit: 2014-12-08 | Discharge: 2014-12-08 | Disposition: A | Discharge status: Home / Self Care | Payer: Commercial Managed Care - HMO | Source: Ambulatory Visit | Attending: Internal Medicine | Admitting: Internal Medicine

## 2014-12-08 DIAGNOSIS — R200 Anesthesia of skin: Secondary | ICD-10-CM

## 2014-12-08 DIAGNOSIS — M5030 Other cervical disc degeneration, unspecified cervical region: Secondary | ICD-10-CM

## 2014-12-09 ENCOUNTER — Other Ambulatory Visit: Payer: Self-pay | Admitting: *Deleted

## 2014-12-09 MED ORDER — GABAPENTIN 100 MG PO CAPS: 200.0000 mg | ORAL_CAPSULE | Freq: Every day | ORAL | Status: DC

## 2014-12-09 NOTE — Telephone Encounter (Signed)
Refill done.  

## 2014-12-20 ENCOUNTER — Other Ambulatory Visit: Payer: Self-pay | Admitting: Podiatry

## 2014-12-22 ENCOUNTER — Encounter (HOSPITAL_COMMUNITY): Payer: Self-pay | Admitting: Emergency Medicine

## 2014-12-22 ENCOUNTER — Emergency Department (HOSPITAL_COMMUNITY)
Admission: EM | Admit: 2014-12-22 | Discharge: 2014-12-22 | Disposition: A | Discharge status: Home / Self Care | Payer: Commercial Managed Care - HMO | Source: Home / Self Care | Attending: Family Medicine | Admitting: Family Medicine

## 2014-12-22 DIAGNOSIS — S43401A Unspecified sprain of right shoulder joint, initial encounter: Secondary | ICD-10-CM

## 2014-12-22 NOTE — ED Provider Notes (Signed)
CSN: [id number]     Arrival date & time 12/22/14  1320 History   First MD Initiated Contact with Patient 12/22/14 1415     Chief Complaint  Patient presents with  . Shoulder Pain   (Consider location/radiation/quality/duration/timing/severity/associated sxs/prior Treatment) HPI Comments: 69 year old male was changing a tire 3 days ago and he pulled hard on the tool and felt sudden pain in the right shoulder. He is complaining of pain primarily to the right deltoid muscle, right anterior shoulder joint line and to the ridge of the trapezius muscle. He states it is a little better from the initial injury. It is made worse with exaggerated and quick type movements. No shoulder asymmetry. Denies other injury.  Patient is a 69 y.o. male presenting with shoulder pain.  Shoulder Pain   Past Medical History  Diagnosis Date  . Hypertension   . Rheumatic fever     residual pulm stenosis, follows with ganji every 25mo  . Anxiety   . Allergic rhinitis due to pollen   . ED (erectile dysfunction)   . OBESITY   . Osteoarthritis   . RHINITIS, CHRONIC   . SLEEP APNEA, OBSTRUCTIVE dx 2000    NPSG 2000:  AHI 76/hr CPAP titrated to 11cm 2002   . Pulmonary stenosis, valvar     TEE 07/2011: stable, LVEF 55%, small PFO  . Refusal of blood transfusions as patient is Jehovah's Witness   . DDD (degenerative disc disease), cervical     Severe w/ osteophytes on xray 3/16   Past Surgical History  Procedure Laterality Date  . Tee without cardioversion  08/02/2011    Procedure: TRANSESOPHAGEAL ECHOCARDIOGRAM (TEE);  Surgeon: Laverda Page, MD;  Location: [facility] ENDOSCOPY;  Service: Cardiovascular;  Laterality: N/A;   Family History  Problem Relation Age of Onset  . Heart disease Mother   . Heart disease Father   . Hypertension Other    History  Substance Use Topics  . Smoking status: Never Smoker   . Smokeless tobacco: Not on file  . Alcohol Use: 0.0 oz/week    0 Glasses of wine per week    Review  of Systems  Constitutional: Negative.  Negative for activity change.  Respiratory: Negative.   Gastrointestinal: Negative.   Genitourinary: Negative.   Musculoskeletal:       As per HPI  Skin: Negative.   Neurological: Negative for dizziness, weakness, numbness and headaches.    Allergies  Other and Penicillins  Home Medications   Prior to Admission medications   Medication Sig Start Date End Date Taking? Authorizing Provider  amLODipine (NORVASC) 5 MG tablet Take 1 tablet (5 mg total) by mouth daily. 08/13/14  Yes Rowe Clack, MD  carvedilol (COREG) 25 MG tablet TAKE 1 TABLET (25 MG TOTAL) BY MOUTH 2 (TWO) TIMES DAILY WITH A MEAL. 11/20/14  Yes Rowe Clack, MD  Chlorpheniramine-DM (CORICIDIN HBP COUGH/COLD PO) Take by mouth. Take by mouth as needed   Yes Historical Provider, MD  Cholecalciferol (VITAMIN D) 2000 UNITS CAPS Take 2,000 Units by mouth daily.   Yes Historical Provider, MD  clonazePAM (KLONOPIN) 1 MG tablet Take 1 tablet (1 mg total) by mouth at bedtime. 07/22/14  Yes Olga Millers, MD  DYMISTA [phone] MCG/ACT SUSP PLACE 2 PUFFS INTO THE NOSE ONCE A DAY 11/16/12  Yes Deneise Lever, MD  fluticasone (FLONASE) 50 MCG/ACT nasal spray 1-2 puffs each nostril once daily 08/25/14  Yes Deneise Lever, MD  gabapentin (NEURONTIN) 100 MG capsule  Take 2 capsules (200 mg total) by mouth at bedtime. 12/09/14  Yes Lyndal Pulley, DO  loratadine (CLARITIN) 10 MG tablet Take 10 mg by mouth daily as needed for allergies.   Yes Historical Provider, MD  losartan-hydrochlorothiazide (HYZAAR) 50-12.5 MG per tablet TAKE 1 TABLET BY MOUTH 2 (TWO) TIMES DAILY. 11/20/14  Yes Rowe Clack, MD  Misc Natural Products (OSTEO BI-FLEX ADV DOUBLE ST PO) Take 2 capsules by mouth daily.   Yes Historical Provider, MD  nabumetone (RELAFEN) 750 MG tablet Take 1 tablet (750 mg total) by mouth 2 (two) times daily. 06/13/14  Yes Rowe Clack, MD  NASAL [facility] NA Place into the nose.   Yes  Historical Provider, MD  Omega-3 Fatty Acids (FISH OIL) 1000 MG CAPS Take 1,000 mg by mouth 2 (two) times daily.   Yes Historical Provider, MD  Turmeric 500 MG CAPS Take 500 mg by mouth daily.   Yes Historical Provider, MD  azelastine (ASTELIN) 0.1 % nasal spray SPRAY ONCE TO TWICE IN EACH NOSTRIL TWICE DAILY AS NEEDED 09/29/14   Deneise Lever, MD  VIAGRA 100 MG tablet Take 1 tablet (100 mg total) by mouth as needed for erectile dysfunction. 05/13/14   Hendricks Limes, MD   BP 152/70 mmHg  Pulse 67  Temp(Src) 98.1 F (36.7 C) (Oral)  Resp 16  SpO2 96% Physical Exam  Constitutional: He is oriented to person, place, and time. He appears well-developed and well-nourished.  HENT:  Head: Normocephalic and atraumatic.  Eyes: EOM are normal. Left eye exhibits no discharge.  Neck: Normal range of motion. Neck supple.  Musculoskeletal:  There is tenderness along the medial trapezius ridge and its insertion into the right neck. Patient states this is actually improved. He has full range of motion of the neck. Minor tenderness over the right deltoid. Tenderness over the anterior shoulder joint line. Patient demonstrates full range of motion with abduction to 180. Internal and external rotation is complete with minimal discomfort. There is no swelling. No discoloration. Grip strength is normal. Biceps and forearm strength is normal. Distal neurovascular motor Sentry is intact.  Neurological: He is alert and oriented to person, place, and time. No cranial nerve deficit.  Skin: Skin is warm and dry.  Psychiatric: He has a normal mood and affect.  Nursing note and vitals reviewed.   ED Course  Procedures (including critical care time) Labs Review Labs Reviewed - No data to display  Imaging Review No results found.   MDM   1. Shoulder sprain, right, initial encounter    Continue to use Aleve and topical Aspercreme as this helps.  Ice for a couple of days then try heat. Movement as  demonstrated, no stresses, lifting pulling for at least a week.     Janne Napoleon, NP 12/22/14 [phone]

## 2014-12-22 NOTE — Discharge Instructions (Signed)
Shoulder Sprain Continue to use Aleve and topical Aspercreme as this helps.  Ice for a couple of days then try heat. Movement as demonstrated, no stresses, lifting pulling for at least a week. A shoulder sprain is the result of damage to the tough, fiber-like tissues (ligaments) that help hold your shoulder in place. The ligaments may be stretched or torn. Besides the main shoulder joint (the ball and socket), there are several smaller joints that connect the bones in this area. A sprain usually involves one of those joints. Most often it is the acromioclavicular (or AC) joint. That is the joint that connects the collarbone (clavicle) and the shoulder blade (scapula) at the top point of the shoulder blade (acromion). A shoulder sprain is a mild form of what is called a shoulder separation. Recovering from a shoulder sprain may take some time. For some, pain lingers for several months. Most people recover without long term problems. CAUSES   A shoulder sprain is usually caused by some kind of trauma. This might be:  Falling on an outstretched arm.  Being hit hard on the shoulder.  Twisting the arm.  Shoulder sprains are more likely to occur in people who:  Play sports.  Have balance or coordination problems. SYMPTOMS   Pain when you move your shoulder.  Limited ability to move the shoulder.  Swelling and tenderness on top of the shoulder.  Redness or warmth in the shoulder.  Bruising.  A change in the shape of the shoulder. DIAGNOSIS  Your healthcare provider may:  Ask about your symptoms.  Ask about recent activity that might have caused those symptoms.  Examine your shoulder. You may be asked to do simple exercises to test movement. The other shoulder will be examined for comparison.  Order some tests that provide a look inside the body. They can show the extent of the injury. The tests could include:  X-rays.  CT (computed tomography) scan.  MRI (magnetic resonance  imaging) scan. RISKS AND COMPLICATIONS  Loss of full shoulder motion.  Ongoing shoulder pain. TREATMENT  How long it takes to recover from a shoulder sprain depends on how severe it was. Treatment options may include:  Rest. You should not use the arm or shoulder until it heals.  Ice. For 2 or 3 days after the injury, put an ice pack on the shoulder up to 4 times a day. It should stay on for 15 to 20 minutes each time. Wrap the ice in a towel so it does not touch your skin.  Over-the-counter medicine to relieve pain.  A sling or brace. This will keep the arm still while the shoulder is healing.  Physical therapy or rehabilitation exercises. These will help you regain strength and motion. Ask your healthcare provider when it is OK to begin these exercises.  Surgery. The need for surgery is rare with a sprained shoulder, but some people may need surgery to keep the joint in place and reduce pain. HOME CARE INSTRUCTIONS   Ask your healthcare provider about what you should and should not do while your shoulder heals.  Make sure you know how to apply ice to the correct area of your shoulder.  Talk with your healthcare provider about which medications should be used for pain and swelling.  If rehabilitation therapy will be needed, ask your healthcare provider to refer you to a therapist. If it is not recommended, then ask about at-home exercises. Find out when exercise should begin. SEEK MEDICAL CARE IF:  Your pain, swelling, or redness at the joint increases. SEEK IMMEDIATE MEDICAL CARE IF:   You have a fever.  You cannot move your arm or shoulder. Document Released: 09/18/2008 Document Revised: 07/25/2011 Document Reviewed: 09/18/2008 [facility] Patient Information 2015 [place], [place]. This information is not intended to replace advice given to you by your health care provider. Make sure you discuss any questions you have with your health care provider.

## 2014-12-22 NOTE — ED Notes (Signed)
Pt reports he inj his right shoulder on Friday 8/5 while changing a car tire Reports he felt a "pop"... Pain increases w/activity Alert, no signs of acute distress.

## 2014-12-30 ENCOUNTER — Other Ambulatory Visit: Payer: Self-pay | Admitting: Internal Medicine

## 2015-01-20 ENCOUNTER — Emergency Department (HOSPITAL_COMMUNITY)
Admission: EM | Admit: 2015-01-20 | Discharge: 2015-01-20 | Disposition: A | Discharge status: Home / Self Care | Payer: Commercial Managed Care - HMO | Attending: Emergency Medicine | Admitting: Emergency Medicine

## 2015-01-20 ENCOUNTER — Encounter (HOSPITAL_COMMUNITY): Payer: Self-pay | Admitting: Emergency Medicine

## 2015-01-20 ENCOUNTER — Emergency Department (HOSPITAL_COMMUNITY): Payer: Commercial Managed Care - HMO

## 2015-01-20 ENCOUNTER — Other Ambulatory Visit: Payer: Self-pay | Admitting: Internal Medicine

## 2015-01-20 DIAGNOSIS — Z7951 Long term (current) use of inhaled steroids: Secondary | ICD-10-CM | POA: Insufficient documentation

## 2015-01-20 DIAGNOSIS — E669 Obesity, unspecified: Secondary | ICD-10-CM | POA: Diagnosis not present

## 2015-01-20 DIAGNOSIS — Z79899 Other long term (current) drug therapy: Secondary | ICD-10-CM | POA: Diagnosis not present

## 2015-01-20 DIAGNOSIS — R011 Cardiac murmur, unspecified: Secondary | ICD-10-CM | POA: Insufficient documentation

## 2015-01-20 DIAGNOSIS — I10 Essential (primary) hypertension: Secondary | ICD-10-CM | POA: Insufficient documentation

## 2015-01-20 DIAGNOSIS — F419 Anxiety disorder, unspecified: Secondary | ICD-10-CM | POA: Diagnosis not present

## 2015-01-20 DIAGNOSIS — Z880 Allergy status to penicillin: Secondary | ICD-10-CM | POA: Insufficient documentation

## 2015-01-20 DIAGNOSIS — M25511 Pain in right shoulder: Secondary | ICD-10-CM | POA: Diagnosis present

## 2015-01-20 MED ORDER — ACETAMINOPHEN 500 MG PO TABS
500.0000 mg | ORAL_TABLET | Freq: Once | ORAL | Status: AC
  Administered 2015-01-20: 500 mg via ORAL
  Filled 2015-01-20: qty 1

## 2015-01-20 MED ORDER — IBUPROFEN 800 MG PO TABS: 800.0000 mg | ORAL_TABLET | Freq: Three times a day (TID) | ORAL | Status: DC

## 2015-01-20 NOTE — ED Provider Notes (Signed)
CSN: [id number]     Arrival date & time 01/20/15  1216 History  This chart was scribed for Gloriann Loan, PA-C, working with Evelina Bucy, MD by Starleen Arms, ED Scribe. This patient was seen in room TR09C/TR09C and the patient's care was started at 1:20 PM.   Chief Complaint  Patient presents with  . Shoulder Pain   The history is provided by the patient. No language interpreter was used.   HPI Comments: Eugene [name] is a 69 y.o. male who presents to the Emergency Department complaining of an exacerbation of waxing and waning, moderate, aching right shoulder pain onset 8/1, worse with movement and unrelieved by Aleve.  Associated symptoms include tingling in right hand fingers slightly worse than baseline.  On 8/1, the patient injured the shoulder while changing a tire and was seen for the injury at Urgent Care.  He was dx'd with a sprain and the complaint improved but continued to ache until he aggravated the shoulder again today while gardening (his hand was above his head and he pulled into his chest).  He heard a "pop" today, similar to the first injury.  He was not prescribed any medications after the initial injury.    Past Medical History  Diagnosis Date  . Hypertension   . Rheumatic fever     residual pulm stenosis, follows with ganji every 23mo  . Anxiety   . Allergic rhinitis due to pollen   . ED (erectile dysfunction)   . OBESITY   . Osteoarthritis   . RHINITIS, CHRONIC   . SLEEP APNEA, OBSTRUCTIVE dx 2000    NPSG 2000:  AHI 76/hr CPAP titrated to 11cm 2002   . Pulmonary stenosis, valvar     TEE 07/2011: stable, LVEF 55%, small PFO  . Refusal of blood transfusions as patient is Jehovah's Witness   . DDD (degenerative disc disease), cervical     Severe w/ osteophytes on xray 3/16   Past Surgical History  Procedure Laterality Date  . Tee without cardioversion  08/02/2011    Procedure: TRANSESOPHAGEAL ECHOCARDIOGRAM (TEE);  Surgeon: Laverda Page, MD;  Location: [facility] ENDOSCOPY;   Service: Cardiovascular;  Laterality: N/A;   Family History  Problem Relation Age of Onset  . Heart disease Mother   . Heart disease Father   . Hypertension Other    Social History  Substance Use Topics  . Smoking status: Never Smoker   . Smokeless tobacco: None  . Alcohol Use: 0.0 oz/week    0 Glasses of wine per week    Review of Systems A complete 10 system review of systems was obtained and all systems are negative except as noted in the HPI and PMH.   Allergies  Other and Penicillins  Home Medications   Prior to Admission medications   Medication Sig Start Date End Date Taking? Authorizing Provider  amLODipine (NORVASC) 5 MG tablet Take 1 tablet (5 mg total) by mouth daily. 08/13/14   Rowe Clack, MD  azelastine (ASTELIN) 0.1 % nasal spray SPRAY ONCE TO TWICE IN EACH NOSTRIL TWICE DAILY AS NEEDED 09/29/14   Deneise Lever, MD  carvedilol (COREG) 25 MG tablet TAKE 1 TABLET (25 MG TOTAL) BY MOUTH 2 (TWO) TIMES DAILY WITH A MEAL. 11/20/14   Rowe Clack, MD  Chlorpheniramine-DM (CORICIDIN HBP COUGH/COLD PO) Take by mouth. Take by mouth as needed    Historical Provider, MD  Cholecalciferol (VITAMIN D) 2000 UNITS CAPS Take 2,000 Units by mouth daily.  Historical Provider, MD  clonazePAM (KLONOPIN) 1 MG tablet Take 1 tablet (1 mg total) by mouth at bedtime. 07/22/14   Olga Millers, MD  DYMISTA 137-50 MCG/ACT SUSP PLACE 2 PUFFS INTO THE NOSE ONCE A DAY 11/16/12   Deneise Lever, MD  fluticasone (FLONASE) 50 MCG/ACT nasal spray SHAKE WELL AND USE 1 TO 2 SPRAYS IN EACH NOSTRIL EVERY DAY 12/30/14   Deneise Lever, MD  gabapentin (NEURONTIN) 100 MG capsule Take 2 capsules (200 mg total) by mouth at bedtime. 12/09/14   Lyndal Pulley, DO  loratadine (CLARITIN) 10 MG tablet Take 10 mg by mouth daily as needed for allergies.    Historical Provider, MD  losartan-hydrochlorothiazide (HYZAAR) 50-12.5 MG per tablet TAKE 1 TABLET BY MOUTH 2 (TWO) TIMES DAILY. 11/20/14   Rowe Clack, MD  Misc Natural Products (OSTEO BI-FLEX ADV DOUBLE ST PO) Take 2 capsules by mouth daily.    Historical Provider, MD  nabumetone (RELAFEN) 750 MG tablet Take 1 tablet (750 mg total) by mouth 2 (two) times daily. 06/13/14   Rowe Clack, MD  NASAL [facility] NA Place into the nose.    Historical Provider, MD  Omega-3 Fatty Acids (FISH OIL) 1000 MG CAPS Take 1,000 mg by mouth 2 (two) times daily.    Historical Provider, MD  Turmeric 500 MG CAPS Take 500 mg by mouth daily.    Historical Provider, MD  VIAGRA 100 MG tablet Take 1 tablet (100 mg total) by mouth as needed for erectile dysfunction. 05/13/14   Hendricks Limes, MD   BP 152/89 mmHg  Pulse 77  Temp(Src) 98.6 F (37 C) (Oral)  Resp 14  Ht 5\' 9"  (1.753 m)  Wt 240 lb (108.863 kg)  BMI 35.43 kg/m2  SpO2 96% Physical Exam  Constitutional: He is oriented to person, place, and time. He appears well-developed and well-nourished. No distress.  HENT:  Head: Normocephalic and atraumatic.  Eyes: Conjunctivae are normal.  Neck: Neck supple. No tracheal deviation present.  Cardiovascular: Normal rate and regular rhythm.   Murmur heard. Pulmonary/Chest: Effort normal and breath sounds normal. No respiratory distress.  Abdominal: Soft. Bowel sounds are normal. He exhibits no distension.  Musculoskeletal:  Markedly decreased AROM and PROM at the right shoulder.  FROM of right elbow.  Tenderness to palpation at deltoid.  NO AC joint tenderness. No obvious deformities appreciated.  Positive empty can test.   Neurological: He is alert and oriented to person, place, and time.  Sensation of upper extremities intact bilaterally.  Decreased strength 3/5 at right shoulder.    Skin: Skin is warm and dry.  Psychiatric: He has a normal mood and affect. His behavior is normal.  Nursing note and vitals reviewed.   ED Course  Procedures (including critical care time)  DIAGNOSTIC STUDIES: Oxygen Saturation is 96% on RA, normal by my  interpretation.    COORDINATION OF CARE:  1:24 PM Discussed treatment plan with patient at bedside.  Patient acknowledges and agrees with plan.    Labs Review Labs Reviewed - No data to display  Imaging Review Dg Shoulder Right  01/20/2015   CLINICAL DATA:  Right shoulder pain after yd work injury.  EXAM: RIGHT SHOULDER - 2+ VIEW  COMPARISON:  None.  FINDINGS: No fracture, Hill-Sachs deformity, dislocation or suspicious focal osseous lesion. Minimal osteoarthritis in the right acromioclavicular joint. Mild widening of the right acromioclavicular joint, with normal right coracoclavicular distance and with no offset at the right acromioclavicular joint.  The right glenohumeral joint appears normal. No pathologic soft tissue calcifications.  IMPRESSION: 1. Mild widening of the right acromioclavicular joint, which could represent a type 2 right AC joint separation. 2. No fracture or dislocation in the right shoulder. 3. Minimal osteoarthritis in the right AC joint.   Electronically Signed   By: Ilona Sorrel M.D.   On: 01/20/2015 14:44   I have personally reviewed and evaluated these images and lab results as part of my medical decision-making.   EKG Interpretation None      MDM   Final diagnoses:  None    Pt presents with right shoulder pain after he heard a popping sound while doing yardwork.  On exam, he exhibits deltoid tenderness.  No AC joint tenderness.  Decreased AROM/PROM.  VSS.  Imaging shows no evidence of acute fracture. Mild widening of right AC joint.  Pt will have orthopedic follow up for further evaluation.  D/c home with NSAIDs and sling.  Pt will call today for ortho f/u in 2 days.    Gloriann Loan, PA-C 01/20/15 1516  Evelina Bucy, MD 01/20/15 [phone]

## 2015-01-20 NOTE — ED Notes (Signed)
Pt sts right shoulder pain after injuring while trying to lift something yesterday

## 2015-01-20 NOTE — Discharge Instructions (Signed)
Acromioclavicular Injuries °The AC (acromioclavicular) joint is the joint in the shoulder where the collarbone (clavicle) meets the shoulder blade (scapula). The part of the shoulder blade connected to the collarbone is called the acromion. Common problems with and treatments for the AC joint are detailed below. °ARTHRITIS °Arthritis occurs when the joint has been injured and the smooth padding between the joints (cartilage) is lost. This is the wear and tear seen in most joints of the body if they have been overused. This causes the joint to produce pain and swelling which is worse with activity.  °AC JOINT SEPARATION °AC joint separation means that the ligaments connecting the acromion of the shoulder blade and collarbone have been damaged, and the two bones no longer line up. AC separations can be anywhere from mild to severe, and are "graded" depending upon which ligaments are torn and how badly they are torn. °· Grade I Injury: the least damage is done, and the AC joint still lines up. °· Grade II Injury: damage to the ligaments which reinforce the AC joint. In a Grade II injury, these ligaments are stretched but not entirely torn. When stressed, the AC joint becomes painful and unstable. °· Grade III Injury: AC and secondary ligaments are completely torn, and the collarbone is no longer attached to the shoulder blade. This results in deformity; a prominence of the end of the clavicle. °AC JOINT FRACTURE °AC joint fracture means that there has been a break in the bones of the AC joint, usually the end of the clavicle. °TREATMENT °TREATMENT OF AC ARTHRITIS °· There is currently no way to replace the cartilage damaged by arthritis. The best way to improve the condition is to decrease the activities which aggravate the problem. Application of ice to the joint helps decrease pain and soreness (inflammation). The use of non-steroidal anti-inflammatory medication is helpful. °· If less conservative measures do not  work, then cortisone shots (injections) may be used. These are anti-inflammatories; they decrease the soreness in the joint and swelling. °· If non-surgical measures fail, surgery may be recommended. The procedure is generally removal of a portion of the end of the clavicle. This is the part of the collarbone closest to your acromion which is stabilized with ligaments to the acromion of the shoulder blade. This surgery may be performed using a tube-like instrument with a light (arthroscope) for looking into a joint. It may also be performed as an open surgery through a small incision by the surgeon. Most patients will have good range of motion within 6 weeks and may return to all activity including sports by 8-12 weeks, barring complications. °TREATMENT OF AN AC SEPARATION °· The initial treatment is to decrease pain. This is best accomplished by immobilizing the arm in a sling and placing an ice pack to the shoulder for 20 to 30 minutes every 2 hours as needed. As the pain starts to subside, it is important to begin moving the fingers, wrist, elbow and eventually the shoulder in order to prevent a stiff or "frozen" shoulder. Instruction on when and how much to move the shoulder will be provided by your caregiver. The length of time needed to regain full motion and function depends on the amount or grade of the injury. Recovery from a Grade I AC separation usually takes 10 to 14 days, whereas a Grade III may take 6 to 8 weeks. °· Grade I and II separations usually do not require surgery. Even Grade III injuries usually allow return to full   activity with few restrictions. Treatment is also based on the activity demands of the injured shoulder. For example, a high level quarterback with an injured throwing arm will receive more aggressive treatment than someone with a desk job who rarely uses his/her arm for strenuous activities. In some cases, a painful lump may persist which could require a later surgery. Surgery  can be very successful, but the benefits must be weighed against the potential risks. °TREATMENT OF AN AC JOINT FRACTURE °Fracture treatment depends on the type of fracture. Sometimes a splint or sling may be all that is required. Other times surgery may be required for repair. This is more frequently the case when the ligaments supporting the clavicle are completely torn. Your caregiver will help you with these decisions and together you can decide what will be the best treatment. °HOME CARE INSTRUCTIONS  °· Apply ice to the injury for 15-20 minutes each hour while awake for 2 days. Put the ice in a plastic bag and place a towel between the bag of ice and skin. °· If a sling has been applied, wear it constantly for as long as directed by your caregiver, even at night. The sling or splint can be removed for bathing or showering or as directed. Be sure to keep the shoulder in the same place as when the sling is on. Do not lift the arm. °· If a figure-of-eight splint has been applied it should be tightened gently by another person every day. Tighten it enough to keep the shoulders held back. Allow enough room to place the index finger between the body and strap. Loosen the splint immediately if there is numbness or tingling in the hands. °· Take over-the-counter or prescription medicines for pain, discomfort or fever as directed by your caregiver. °· If you or your child has received a follow up appointment, it is very important to keep that appointment in order to avoid long term complications, chronic pain or disability. °SEEK MEDICAL CARE IF:  °· The pain is not relieved with medications. °· There is increased swelling or discoloration that continues to get worse rather than better. °· You or your child has been unable to follow up as instructed. °· There is progressive numbness and tingling in the arm, forearm or hand. °SEEK IMMEDIATE MEDICAL CARE IF:  °· The arm is numb, cold or pale. °· There is increasing pain  in the hand, forearm or fingers. °MAKE SURE YOU:  °· Understand these instructions. °· Will watch your condition. °· Will get help right away if you are not doing well or get worse. °Document Released: 02/09/2005 Document Revised: 07/25/2011 Document Reviewed: 08/04/2008 °ExitCare® Patient Information ©2015 [employer]. This information is not intended to replace advice given to you by your health care provider. Make sure you discuss any questions you have with your health care provider. °Shoulder Pain °The shoulder is the joint that connects your arms to your body. The bones that form the shoulder joint include the upper arm bone (humerus), the shoulder blade (scapula), and the collarbone (clavicle). The top of the humerus is shaped like a ball and fits into a rather flat socket on the scapula (glenoid cavity). A combination of muscles and strong, fibrous tissues that connect muscles to bones (tendons) support your shoulder joint and hold the ball in the socket. Small, fluid-filled sacs (bursae) are located in different areas of the joint. They act as cushions between the bones and the overlying soft tissues and help reduce friction between   the gliding tendons and the bone as you move your arm. Your shoulder joint allows a wide range of motion in your arm. This range of motion allows you to do things like scratch your back or throw a ball. However, this range of motion also makes your shoulder more prone to pain from overuse and injury. °Causes of shoulder pain can originate from both injury and overuse and usually can be grouped in the following four categories: °· Redness, swelling, and pain (inflammation) of the tendon (tendinitis) or the bursae (bursitis). °· Instability, such as a dislocation of the joint. °· Inflammation of the joint (arthritis). °· Broken bone (fracture). °HOME CARE INSTRUCTIONS  °· Apply ice to the sore area. °¨ Put ice in a plastic bag. °¨ Place a towel between your skin and the  bag. °¨ Leave the ice on for 15-20 minutes, 3-4 times per day for the first 2 days, or as directed by your health care provider. °· Stop using cold packs if they do not help with the pain. °· If you have a shoulder sling or immobilizer, wear it as long as your caregiver instructs. Only remove it to shower or bathe. Move your arm as little as possible, but keep your hand moving to prevent swelling. °· Squeeze a soft ball or foam pad as much as possible to help prevent swelling. °· Only take over-the-counter or prescription medicines for pain, discomfort, or fever as directed by your caregiver. °SEEK MEDICAL CARE IF:  °· Your shoulder pain increases, or new pain develops in your arm, hand, or fingers. °· Your hand or fingers become cold and numb. °· Your pain is not relieved with medicines. °SEEK IMMEDIATE MEDICAL CARE IF:  °· Your arm, hand, or fingers are numb or tingling. °· Your arm, hand, or fingers are significantly swollen or turn white or blue. °MAKE SURE YOU:  °· Understand these instructions. °· Will watch your condition. °· Will get help right away if you are not doing well or get worse. °Document Released: 02/09/2005 Document Revised: 09/16/2013 Document Reviewed: 04/16/2011 °ExitCare® Patient Information ©2015 [employer]. This information is not intended to replace advice given to you by your health care provider. Make sure you discuss any questions you have with your health care provider. ° °

## 2015-01-21 ENCOUNTER — Telehealth: Payer: Self-pay | Admitting: Internal Medicine

## 2015-01-21 NOTE — Telephone Encounter (Signed)
Pt called request [employer] referral to go to Dr. Edmonia Lynch on [address] stated he knock his shoulder out of place and he went in the ER. Please call pt once its done.

## 2015-01-22 NOTE — Telephone Encounter (Signed)
Pt scheduled for 01/25/14 @ 10:45am. Craig Staggers #[id number] valid 01/22/2015 - 07/21/2015 for 6 visits. Pt is aware.

## 2015-02-12 ENCOUNTER — Other Ambulatory Visit: Payer: Self-pay | Admitting: Internal Medicine

## 2015-02-13 ENCOUNTER — Other Ambulatory Visit: Payer: Self-pay

## 2015-02-13 MED ORDER — NABUMETONE 750 MG PO TABS: 750.0000 mg | ORAL_TABLET | Freq: Two times a day (BID) | ORAL | Status: DC

## 2015-02-16 ENCOUNTER — Telehealth: Payer: Self-pay | Admitting: Internal Medicine

## 2015-02-16 NOTE — Telephone Encounter (Signed)
Pt calling to see if we received surgery clearance from Raliegh Ip He can be reached at [phone]

## 2015-02-18 NOTE — Telephone Encounter (Signed)
Spoke to pt and pt stated that they have received everything that they needed. Closing note.

## 2015-04-27 ENCOUNTER — Encounter (HOSPITAL_COMMUNITY): Payer: Self-pay | Admitting: Family Medicine

## 2015-04-27 ENCOUNTER — Emergency Department (HOSPITAL_COMMUNITY): Payer: Commercial Managed Care - HMO

## 2015-04-27 ENCOUNTER — Emergency Department (HOSPITAL_COMMUNITY)
Admission: EM | Admit: 2015-04-27 | Discharge: 2015-04-28 | Disposition: A | Discharge status: Home / Self Care | Payer: Commercial Managed Care - HMO | Attending: Emergency Medicine | Admitting: Emergency Medicine

## 2015-04-27 DIAGNOSIS — F419 Anxiety disorder, unspecified: Secondary | ICD-10-CM | POA: Insufficient documentation

## 2015-04-27 DIAGNOSIS — K5732 Diverticulitis of large intestine without perforation or abscess without bleeding: Secondary | ICD-10-CM | POA: Insufficient documentation

## 2015-04-27 DIAGNOSIS — Z8709 Personal history of other diseases of the respiratory system: Secondary | ICD-10-CM | POA: Diagnosis not present

## 2015-04-27 DIAGNOSIS — R34 Anuria and oliguria: Secondary | ICD-10-CM | POA: Diagnosis not present

## 2015-04-27 DIAGNOSIS — Z880 Allergy status to penicillin: Secondary | ICD-10-CM | POA: Insufficient documentation

## 2015-04-27 DIAGNOSIS — Z79899 Other long term (current) drug therapy: Secondary | ICD-10-CM | POA: Insufficient documentation

## 2015-04-27 DIAGNOSIS — I10 Essential (primary) hypertension: Secondary | ICD-10-CM | POA: Diagnosis not present

## 2015-04-27 DIAGNOSIS — K5730 Diverticulosis of large intestine without perforation or abscess without bleeding: Secondary | ICD-10-CM

## 2015-04-27 DIAGNOSIS — E669 Obesity, unspecified: Secondary | ICD-10-CM | POA: Insufficient documentation

## 2015-04-27 DIAGNOSIS — R1031 Right lower quadrant pain: Secondary | ICD-10-CM | POA: Diagnosis present

## 2015-04-27 DIAGNOSIS — M1990 Unspecified osteoarthritis, unspecified site: Secondary | ICD-10-CM | POA: Insufficient documentation

## 2015-04-27 DIAGNOSIS — G4733 Obstructive sleep apnea (adult) (pediatric): Secondary | ICD-10-CM | POA: Diagnosis not present

## 2015-04-27 DIAGNOSIS — K5900 Constipation, unspecified: Secondary | ICD-10-CM | POA: Diagnosis not present

## 2015-04-27 DIAGNOSIS — N529 Male erectile dysfunction, unspecified: Secondary | ICD-10-CM | POA: Insufficient documentation

## 2015-04-27 LAB — URINALYSIS, ROUTINE W REFLEX MICROSCOPIC
BILIRUBIN URINE: NEGATIVE
GLUCOSE, UA: NEGATIVE mg/dL
HGB URINE DIPSTICK: NEGATIVE
KETONES UR: NEGATIVE mg/dL
Leukocytes, UA: NEGATIVE
NITRITE: NEGATIVE
PH: 6 (ref 5.0–8.0)
Protein, ur: NEGATIVE mg/dL
SPECIFIC GRAVITY, URINE: 1.02 (ref 1.005–1.030)

## 2015-04-27 LAB — I-STAT CHEM 8, ED
BUN: 18 mg/dL (ref 6–20)
CALCIUM ION: 1.01 mmol/L — AB (ref 1.13–1.30)
CHLORIDE: 105 mmol/L (ref 101–111)
CREATININE: 1.3 mg/dL — AB (ref 0.61–1.24)
GLUCOSE: 93 mg/dL (ref 65–99)
HCT: 42 % (ref 39.0–52.0)
Hemoglobin: 14.3 g/dL (ref 13.0–17.0)
Potassium: 3.7 mmol/L (ref 3.5–5.1)
SODIUM: 139 mmol/L (ref 135–145)
TCO2: 26 mmol/L (ref 0–100)

## 2015-04-27 LAB — CBC WITH DIFFERENTIAL/PLATELET
Basophils Absolute: 0 10*3/uL (ref 0.0–0.1)
Basophils Relative: 0 %
EOS ABS: 0.1 10*3/uL (ref 0.0–0.7)
Eosinophils Relative: 1 %
HEMATOCRIT: 40.7 % (ref 39.0–52.0)
HEMOGLOBIN: 13.5 g/dL (ref 13.0–17.0)
Lymphocytes Relative: 35 %
Lymphs Abs: 4 10*3/uL (ref 0.7–4.0)
MCH: 33.8 pg (ref 26.0–34.0)
MCHC: 33.2 g/dL (ref 30.0–36.0)
MCV: 101.8 fL — AB (ref 78.0–100.0)
Monocytes Absolute: 0.9 10*3/uL (ref 0.1–1.0)
Monocytes Relative: 8 %
NEUTROS ABS: 6.4 10*3/uL (ref 1.7–7.7)
NEUTROS PCT: 56 %
Platelets: 159 10*3/uL (ref 150–400)
RBC: 4 MIL/uL — AB (ref 4.22–5.81)
RDW: 12.5 % (ref 11.5–15.5)
WBC: 11.3 10*3/uL — AB (ref 4.0–10.5)

## 2015-04-27 MED ORDER — BARIUM SULFATE 2.1 % PO SUSP
ORAL | Status: AC
  Administered 2015-04-27: 450 mL
  Filled 2015-04-27: qty 1

## 2015-04-27 MED ORDER — IOHEXOL 300 MG/ML  SOLN
100.0000 mL | Freq: Once | INTRAMUSCULAR | Status: AC | PRN
  Administered 2015-04-27: 100 mL via INTRAVENOUS

## 2015-04-27 NOTE — ED Notes (Signed)
Pt here for right groin pain that started Friday that eases at times. sts radiates into back.

## 2015-04-27 NOTE — ED Provider Notes (Signed)
CSN: [id number]     Arrival date & time 04/27/15  1526 History   First MD Initiated Contact with Patient 04/27/15 2005     Chief Complaint  Patient presents with  . Groin Pain     (Consider location/radiation/quality/duration/timing/severity/associated sxs/prior Treatment) HPI Comments: This is a 69 year old African-American male reports, that he's had bilateral low back pain radiating to right groin.  The pain on his left has stopped after one day.  He has had persistent right flank and right groin pain.  Into the suprapubic area.  He reports that his urine stream is less effective over the last 3 days.  He does not notice any blood in his urine.  He's not had any nausea.  He states he has not passed any gas in the past 2 days.  He does have a history of constipation for which he takes a stool softener on a regular basis.  Does not have a history of kidney stones.  Has not had any nausea or vomiting.  He does state that when he gets up to walk to the bathroom.  He cannot stand up straight due to the discomfort in his lower abdomen.  He has been eating and drinking less than normal  Patient is a 69 y.o. male presenting with groin pain. The history is provided by the patient.  Groin Pain This is a new problem. The current episode started in the past 7 days. The problem occurs intermittently. The problem has been unchanged. Associated symptoms include abdominal pain and urinary symptoms. Pertinent negatives include no anorexia, change in bowel habit, chills, fever, headaches, myalgias, nausea or vomiting. Nothing aggravates the symptoms. He has tried NSAIDs for the symptoms. The treatment provided mild relief.    Past Medical History  Diagnosis Date  . Hypertension   . Rheumatic fever     residual pulm stenosis, follows with [facility] every 9mo  . Anxiety   . Allergic rhinitis due to pollen   . ED (erectile dysfunction)   . OBESITY   . Osteoarthritis   . RHINITIS, CHRONIC   . SLEEP APNEA,  OBSTRUCTIVE dx 2000    NPSG 2000:  AHI 76/hr CPAP titrated to 11cm 2002   . Pulmonary stenosis, valvar     TEE 07/2011: stable, LVEF 55%, small PFO  . Refusal of blood transfusions as patient is Jehovah's Witness   . DDD (degenerative disc disease), cervical     Severe w/ osteophytes on xray 3/16   Past Surgical History  Procedure Laterality Date  . Tee without cardioversion  08/02/2011    Procedure: TRANSESOPHAGEAL ECHOCARDIOGRAM (TEE);  Surgeon: Laverda Page, MD;  Location: [facility];  Service: Cardiovascular;  Laterality: N/A;   Family History  Problem Relation Age of Onset  . Heart disease Mother   . Heart disease Father   . Hypertension Other    Social History  Substance Use Topics  . Smoking status: Never Smoker   . Smokeless tobacco: None  . Alcohol Use: 0.0 oz/week    0 Glasses of wine per week    Review of Systems  Constitutional: Negative for fever and chills.  Respiratory: Negative for shortness of breath.   Gastrointestinal: Positive for abdominal pain and constipation. Negative for nausea, vomiting, anorexia and change in bowel habit.  Genitourinary: Positive for flank pain and decreased urine volume. Negative for dysuria, frequency and hematuria.  Musculoskeletal: Negative for myalgias.  Neurological: Negative for headaches.  All other systems reviewed and are negative.  Allergies  Other and Penicillins  Home Medications   Prior to Admission medications   Medication Sig Start Date End Date Taking? Authorizing Provider  amLODipine (NORVASC) 5 MG tablet Take 1 tablet (5 mg total) by mouth daily. 08/13/14  Yes Rowe Clack, MD  azelastine (ASTELIN) 0.1 % nasal spray SPRAY ONCE TO TWICE IN EACH NOSTRIL TWICE DAILY AS NEEDED 09/29/14  Yes Deneise Lever, MD  carvedilol (COREG) 25 MG tablet Take 1 tablet (25 mg total) by mouth 2 (two) times daily with a meal. 01/20/15  Yes Rowe Clack, MD  Cholecalciferol (VITAMIN D) 2000 UNITS CAPS Take  2,000 Units by mouth daily.   Yes Historical Provider, MD  clonazePAM (KLONOPIN) 1 MG tablet Take 1 tablet (1 mg total) by mouth at bedtime. 07/22/14  Yes Hoyt Koch, MD  DYMISTA 137-50 MCG/ACT SUSP PLACE 2 PUFFS INTO THE NOSE ONCE A DAY 11/16/12  Yes Deneise Lever, MD  fluticasone (FLONASE) 50 MCG/ACT nasal spray SHAKE WELL AND USE 1 TO 2 SPRAYS IN EACH NOSTRIL EVERY DAY 12/30/14  Yes Deneise Lever, MD  gabapentin (NEURONTIN) 100 MG capsule Take 2 capsules (200 mg total) by mouth at bedtime. 12/09/14  Yes Lyndal Pulley, DO  loratadine (CLARITIN) 10 MG tablet Take 10 mg by mouth daily as needed for allergies.   Yes Historical Provider, MD  losartan-hydrochlorothiazide (HYZAAR) 50-12.5 MG per tablet Take 1 tablet by mouth 2 (two) times daily. 01/20/15  Yes Rowe Clack, MD  Misc Natural Products (OSTEO BI-FLEX ADV DOUBLE ST PO) Take 2 capsules by mouth daily.   Yes Historical Provider, MD  nabumetone (RELAFEN) 750 MG tablet Take 1 tablet (750 mg total) by mouth 2 (two) times daily. 02/13/15  Yes Rowe Clack, MD  Omega-3 Fatty Acids (FISH OIL) 1000 MG CAPS Take 1,000 mg by mouth 2 (two) times daily.   Yes Historical Provider, MD  Turmeric 500 MG CAPS Take 500 mg by mouth daily.   Yes Historical Provider, MD  VIAGRA 100 MG tablet Take 1 tablet (100 mg total) by mouth as needed for erectile dysfunction. 05/13/14  Yes Hendricks Limes, MD  ibuprofen (ADVIL,MOTRIN) 800 MG tablet Take 1 tablet (800 mg total) by mouth 3 (three) times daily. Patient not taking: Reported on 04/27/2015 01/20/15   Gloriann Loan, PA-C  polyethylene glycol powder (GLYCOLAX/MIRALAX) powder Take 17 g by mouth 2 (two) times daily. 04/28/15   Junius Creamer, NP   BP 141/81 mmHg  Pulse 84  Temp(Src) 98.3 F (36.8 C) (Oral)  Resp 16  Ht 5\' 9"  (1.753 m)  Wt 117.845 kg  BMI 38.35 kg/m2  SpO2 99% Physical Exam  Constitutional: He appears well-developed and well-nourished.  HENT:  Head: Normocephalic.  Eyes: Pupils  are equal, round, and reactive to light.  Neck: Normal range of motion.  Cardiovascular: Normal rate.   Pulmonary/Chest: Effort normal.  Abdominal: Soft. He exhibits no distension. Bowel sounds are decreased. There is tenderness in the suprapubic area.    Musculoskeletal: Normal range of motion. He exhibits no edema.  Neurological: He is alert.  Skin: Skin is warm and dry. No rash noted. No erythema.  Nursing note and vitals reviewed.   ED Course  Procedures (including critical care time) Labs Review Labs Reviewed  CBC WITH DIFFERENTIAL/PLATELET - Abnormal; Notable for the following:    WBC 11.3 (*)    RBC 4.00 (*)    MCV 101.8 (*)    All other components within  normal limits  I-STAT CHEM 8, ED - Abnormal; Notable for the following:    Creatinine, Ser 1.30 (*)    Calcium, Ion 1.01 (*)    All other components within normal limits  URINALYSIS, ROUTINE W REFLEX MICROSCOPIC (NOT AT [facility])    Imaging Review Ct Abdomen Pelvis W Contrast  04/28/2015  CLINICAL DATA:  Bilateral lower abdominal pain radiating to both flanks. Pain since Friday. EXAM: CT ABDOMEN AND PELVIS WITH CONTRAST TECHNIQUE: Multidetector CT imaging of the abdomen and pelvis was performed using the standard protocol following bolus administration of intravenous contrast. CONTRAST:  175mL OMNIPAQUE IOHEXOL 300 MG/ML  SOLN COMPARISON:  05/06/2008 FINDINGS: Small subpleural nodules in the lung bases are unchanged since previous study suggesting benign etiology. The liver, spleen, gallbladder, pancreas, adrenal glands, abdominal aorta, inferior vena cava, and retroperitoneal lymph nodes are unremarkable. There are punctate size stones measuring 2 mm or less in both kidneys. No hydronephrosis in either kidney. No hydroureter. No ureteral stones identified. Stomach, small bowel, and colon are not abnormally distended. No free air or free fluid in the abdomen. Pelvis: Appendix is normal. Diverticula in the sigmoid colon without  evidence of diverticulitis. Prostate gland is not enlarged. Bladder wall is not thickened. No free or loculated pelvic fluid collections. No pelvic mass or lymphadenopathy. Degenerative changes in the spine. No destructive bone lesions. IMPRESSION: No acute process demonstrated in the abdomen or pelvis. Nonobstructing punctate sized renal stones bilaterally. Sub cm nodules in the lung bases are unchanged since previous study suggesting benign etiology. Diverticulosis of sigmoid colon without evidence of diverticulitis. Electronically Signed   By: Lucienne Capers M.D.   On: 04/28/2015 00:01   Dg Abd Acute W/chest  04/27/2015  CLINICAL DATA:  Acute onset of right lower quadrant abdominal pain, radiating to back. Increased urinary urgency. Initial encounter. EXAM: DG ABDOMEN ACUTE W/ 1V CHEST COMPARISON:  Chest radiograph performed 10/20/2011, and CT of the abdomen and pelvis from 05/06/2008 FINDINGS: The lungs are well-aerated and clear. There is no evidence of focal opacification, pleural effusion or pneumothorax. The cardiomediastinal silhouette is borderline normal in size. The visualized bowel gas pattern is unremarkable. Scattered stool and air are seen within the colon; there is no evidence of small bowel dilatation to suggest obstruction. No free intra-abdominal air is identified on the provided decubitus view. No acute osseous abnormalities are seen; the sacroiliac joints are unremarkable in appearance. IMPRESSION: 1. No acute cardiopulmonary process seen. 2. Unremarkable bowel gas pattern; no free intra-abdominal air seen. Small to moderate amount of stool noted in the colon. Electronically Signed   By: Garald Balding M.D.   On: 04/27/2015 21:16   I have personally reviewed and evaluated these images and lab results as part of my medical decision-making.   EKG Interpretation None      MDM   Final diagnoses:  Constipation, unspecified constipation type  Diverticulosis of large intestine  without hemorrhage         Junius Creamer, NP 04/28/15 [id number]  Blanchie Dessert, MD 04/29/15 [id number]

## 2015-04-28 MED ORDER — POLYETHYLENE GLYCOL 3350 17 GM/SCOOP PO POWD: 17.0000 g | Freq: Two times a day (BID) | ORAL | Status: DC

## 2015-04-28 NOTE — Discharge Instructions (Signed)
Constipation, Adult Constipation is when a person has fewer than three bowel movements a week, has difficulty having a bowel movement, or has stools that are dry, hard, or larger than normal. As people grow older, constipation is more common. A low-fiber diet, not taking in enough fluids, and taking certain medicines may make constipation worse.  CAUSES   Certain medicines, such as antidepressants, pain medicine, iron supplements, antacids, and water pills.   Certain diseases, such as diabetes, irritable bowel syndrome (IBS), thyroid disease, or depression.   Not drinking enough water.   Not eating enough fiber-rich foods.   Stress or travel.   Lack of physical activity or exercise.   Ignoring the urge to have a bowel movement.   Using laxatives too much.  SIGNS AND SYMPTOMS   Having fewer than three bowel movements a week.   Straining to have a bowel movement.   Having stools that are hard, dry, or larger than normal.   Feeling full or bloated.   Pain in the lower abdomen.   Not feeling relief after having a bowel movement.  DIAGNOSIS  Your health care provider will take a medical history and perform a physical exam. Further testing may be done for severe constipation. Some tests may include:  A barium enema X-ray to examine your rectum, colon, and, sometimes, your small intestine.   A sigmoidoscopy to examine your lower colon.   A colonoscopy to examine your entire colon. TREATMENT  Treatment will depend on the severity of your constipation and what is causing it. Some dietary treatments include drinking more fluids and eating more fiber-rich foods. Lifestyle treatments may include regular exercise. If these diet and lifestyle recommendations do not help, your health care provider may recommend taking over-the-counter laxative medicines to help you have bowel movements. Prescription medicines may be prescribed if over-the-counter medicines do not work.    HOME CARE INSTRUCTIONS   Eat foods that have a lot of fiber, such as fruits, vegetables, whole grains, and beans.  Limit foods high in fat and processed sugars, such as french fries, hamburgers, cookies, candies, and soda.   A fiber supplement may be added to your diet if you cannot get enough fiber from foods.   Drink enough fluids to keep your urine clear or pale yellow.   Exercise regularly or as directed by your health care provider.   Go to the restroom when you have the urge to go. Do not hold it.   Only take over-the-counter or prescription medicines as directed by your health care provider. Do not take other medicines for constipation without talking to your health care provider first.  [place] IF:   You have bright red blood in your stool.   Your constipation lasts for more than 4 days or gets worse.   You have abdominal or rectal pain.   You have thin, pencil-like stools.   You have unexplained weight loss. MAKE SURE YOU:   Understand these instructions.  Will watch your condition.  Will get help right away if you are not doing well or get worse.   This information is not intended to replace advice given to you by your health care provider. Make sure you discuss any questions you have with your health care provider.   Document Released: 01/29/2004 Document Revised: 05/23/2014 Document Reviewed: 02/11/2013 Elsevier Interactive Patient Education [employer]. The CT Scan shows you do have diverticular disease but have no sign of infection at this time

## 2015-04-28 NOTE — ED Notes (Signed)
Patient left at this time with all belongings. 

## 2015-04-29 ENCOUNTER — Telehealth: Payer: Self-pay | Admitting: Internal Medicine

## 2015-04-29 NOTE — Telephone Encounter (Signed)
Dr. Annamaria Boots assistant called request [employer] referral for Eugene [name] to see Dr. Annamaria Boots on Monday 05/04/15. Please help and get this authorization before Monday, the old one expried.

## 2015-04-30 NOTE — Telephone Encounter (Signed)
Eugene [name] Josem Kaufmann [id number] valid 05/04/2015 - 10/31/2015 for 6 visits

## 2015-05-01 ENCOUNTER — Telehealth: Payer: Self-pay

## 2015-05-01 NOTE — Telephone Encounter (Signed)
Call to the patient to schedule AWV; scheduled for 1230 at 9am

## 2015-05-04 ENCOUNTER — Ambulatory Visit (INDEPENDENT_AMBULATORY_CARE_PROVIDER_SITE_OTHER): Payer: Commercial Managed Care - HMO | Admitting: Internal Medicine

## 2015-05-04 ENCOUNTER — Encounter: Payer: Self-pay | Admitting: Internal Medicine

## 2015-05-04 VITALS — BP 124/74 | HR 64 | Ht 69.0 in | Wt 255.8 lb

## 2015-05-04 DIAGNOSIS — R1031 Right lower quadrant pain: Secondary | ICD-10-CM

## 2015-05-04 DIAGNOSIS — Z23 Encounter for immunization: Secondary | ICD-10-CM

## 2015-05-04 NOTE — Progress Notes (Signed)
09/15/11- 15 yoM never smoker self referred for allergy evaluation. Former patient at [facility]. Now followed here by Dr Gwenette Greet for OSA. Old records being obtained. He complains that for 4 or 5 months he has had numbness in the right side of his face "like at dentist". Right eye burns and walkers. The vision is okay and hearing okay. No discomfort with his teeth. This sensation has been persistent without change. Dr Gerald Stabs Newman/ ENT did CT scan of sinuses on 04/01/2011 describing mild mucosal thickening with no air-fluid levels. 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 No mass. He says nasal congestion is such that he has to mouth breathe but congestion shifts back and forth. He denies ENT trauma or surgery. Has been taking Astelin and fluticasone nasal sprays with little benefit. 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  10/20/11- 54 yoM never smoker self referred for allergy evaluation. Former patient at [facility]. Now followed here by Dr Gwenette Greet for OSA. At last visit we gave him a nasal nebulizer treatment and sample of Dymista, which seemed to help. When the sample ran out, he went back to [facility] which doesn't work as well. He is just using Astelin now. Last week he had a viral chest cold with hard cough since then has had a dull persistent substernal ache increased by reaching or lifting with his arms. Still has some productive cough with white sputum. Some wheeze.  02/20/12- 69 yoM never smoker followed by CDY for allergy . Former patient at [facility]. Now followed here by Dr Gwenette Greet for OSA.  last Thursday had sinus headache and could not get rid of; overall has good and bad days; congestion at times. He had been outdoors all day and the next day developed persistent frontal headache. Headache is now clear but he has residual nasal congestion. Dymista nasal spray has been some help. He asks about chronic coated tongue despite rush but with  his toothbrush and using mouthwash. He is not diabetic. Aware of postnasal drip.  08/20/12- 66 yoM never smoker followed by CDY for allergic rhinitis . Former patient at [facility]. Now followed here by Dr Gwenette Greet for OSA. FOLLOWS FOR: bloody nasal drainage for about past 2 months-comes and goes(mornings are normally the worst time for patient). Using Astelin and loratadine potential for overdrying. Tongue is coated. Remains on CPAP with humidifier, supervised by Dr. Gwenette Greet. Stopped allergy vaccine 1:10 GO  02/18/13- 67 yoM never smoker followed by [facility]Y for allergic rhinitis . Former patient at [facility]. Now followed here by Dr Gwenette Greet for OSA/ CPAP. FOLLOWS FOR: Has "clogged" days and others "feels clear" Stopped allergy vaccine 1:10 GO Shifting nasal stuffiness usually worse on R side despite otc decongestants. Ears ok.  Known septal deviation and mild chronic sinusitis on CT maxfac 2012. Had unsatisfactory visit with Dr Lucia Gaskins then- never heard back.  08/19/13- 67 yoM never smoker followed by [facility] for allergic rhinitis . Former patient at [facility]. Now followed here by Dr Gwenette Greet for OSA/ CPAP. FOLLOWS FOR: sinus pressure since mid March 2015-has not been on allergy vaccine in years. Saw Dr. Lovenia Shuck about persistent nasal stuffiness. He was not a surgical candidate but told to use decongestant spray just at night. He is not having much seasonal itching or sneezing but uses Astelin nasal spray before he goes outdoors  08/22/14-  68 yoM never smoker followed by [facility] for allergic rhinitis, hx chronic sinusitis . Former patient at [facility]. FOLLOW FOR:  allergies.  very congested x 1 month.  sinus pressure, sinus congestion.   Increased nasal congestion  especially within the past 7-10 days blamed on pollen Using azelastine nasal spray. No chest tightness or wheeze. No headache or purulent discharge.  10/30/14- 68 yoM never smoker followed by [facility] for allergic rhinitis, OSA, hx chronic sinusitis . Former patient at  [facility]. Follows For: Pt states allergies have improved. Pt states flonase and azelastine nasal spray have helped.  CPAP [facility]/ [facility] doing fine.  Rhinitis controlled much better after allergy season. Patient's meds help. We discussed availability of sublingual immunotherapy =future consideration.  05/04/2015-69 year old male never smoker followed for allergic rhinitis, OSA, history chronic sinusitis CPAP [facility] FOLLOWS FOR: DME is [facility]-pt states he placed "card" in mail yesterday to [place] for compliance report. Pt has not used CPAP much recently due to rotator cuff injury (right shoulder).    ROS-see HPI Constitutional:   No-   weight loss, night sweats, fevers, chills, fatigue, lassitude. HEENT:   No-  headaches, difficulty swallowing, tooth/dental problems, sore throat,       No- sneezing, itching, ear ache, +nasal congestion, no-post nasal drip,  CV:  No-   chest pain, orthopnea, PND, swelling in lower extremities, anasarca, dizziness, palpitations Resp: No-   shortness of breath with exertion or at rest.              No-   productive cough,  No non-productive cough,  No- coughing up of blood.              No-   change in color of mucus.  No- wheezing.   Skin: No-   rash or lesions. GI:  No-   heartburn, indigestion, abdominal pain, nausea, vomiting,  GU:   MS:  No-   joint pain or swelling.   Neuro-     nothing unusual Psych:  No- change in mood or affect. No depression or anxiety.  No memory loss.  OBJ- Physical Exam General- Alert, Oriented, Affect-appropriate, Distress- none acute.  Skin- rash-none, lesions- none, excoriation- none Lymphadenopathy- none Head- atraumatic            Eyes- Gross vision intact, PERRLA, conjunctivae and secretions clear,               + periorbital edema            Ears- Hearing, canals-normal            Nose-  + marked turbinate edema, +mild septal dev, No- mucus, polyps, erosion, perforation.             Throat- Mallampati III-IV ,  mucosa-clear , drainage- none, tonsils- atrophic Neck- flexible , trachea midline, no stridor , thyroid nl, carotid no bruit Chest - symmetrical excursion , unlabored           Heart/CV- RRR , no murmur , no gallop  , no rub, nl s1 s2                           - JVD- none , edema- none, stasis changes- none, varices- none           Lung- clear to P&A, no-wheeze, cough- none , dullness-none, rub- none           Chest wall-  Abd-  Br/ Gen/ Rectal- Not done, not indicated Extrem- cyanosis- none, clubbing, none, atrophy- none, strength- nl Neuro- grossly intact to observation.

## 2015-05-04 NOTE — Patient Instructions (Signed)
Order- referral to GI ( prior pt of Dr Sharlett Iles)   C/o Right Lower Quadrant abdominal pain worse with CPAP and preventing adequate use of CPAP.  Flu vax

## 2015-05-05 ENCOUNTER — Ambulatory Visit (INDEPENDENT_AMBULATORY_CARE_PROVIDER_SITE_OTHER): Payer: Commercial Managed Care - HMO | Admitting: Gastroenterology

## 2015-05-05 ENCOUNTER — Encounter: Payer: Self-pay | Admitting: Gastroenterology

## 2015-05-05 VITALS — BP 126/74 | HR 72 | Ht 69.0 in | Wt 255.0 lb

## 2015-05-05 DIAGNOSIS — M545 Low back pain: Secondary | ICD-10-CM | POA: Diagnosis not present

## 2015-05-05 DIAGNOSIS — R1031 Right lower quadrant pain: Secondary | ICD-10-CM | POA: Diagnosis not present

## 2015-05-05 NOTE — Progress Notes (Signed)
    History of Present Illness: This is a 69 year old white male who developed acute right-sided back pain and right lower quadrant pain several days ago. His pain is brought on by sitting up and other movements. It is not related to any digestive function. He was evaluated in [facility] with CT listed below. For unclear reasons he was prescribed MiraLAX which has made his stools looser but has not changed his symptoms. He has had some relief in symptoms using Advil and Aspercreme. WBC elevated at 11.3, MCV elevated at 101.8. UA unremarkable. Colonoscopy in December 2009 showed a small hyperplastic polyp and was otherwise normal. Denies weight loss, constipation, diarrhea, change in stool caliber, melena, hematochezia, nausea, vomiting, dysphagia, reflux symptoms, chest pain.  Abd/pelvic CT IMPRESSION: No acute process demonstrated in the abdomen or pelvis. Nonobstructing punctate sized renal stones bilaterally. Sub cm nodules in the lung bases are unchanged since previous study suggesting benign etiology. Diverticulosis of sigmoid colon without evidence of diverticulitis.  Current Medications, Allergies, Past Medical History, Past Surgical History, Family History and Social History were reviewed in [facility] record.  Physical Exam: General: Well developed, well nourished, no acute distress Head: Normocephalic and atraumatic Eyes:  sclerae anicteric, EOMI Ears: Normal auditory acuity Mouth: No deformity or lesions Lungs: Clear throughout to auscultation Heart: Regular rate and rhythm; no murmurs, rubs or bruits Abdomen: Soft, mild focal right lower quadrant tenderness to deep palpation without rebound or guarding and non distended. No masses, hepatosplenomegaly or hernias noted. Normal Bowel sounds Rectal: no lesions, no tenderness, Hemoccult-negative brown stool in vault Musculoskeletal: Symmetrical with no gross deformities  Pulses:  Normal pulses noted Extremities: No  clubbing, cyanosis, edema or deformities noted Neurological: Alert oriented x 4, grossly nonfocal Psychological:  Alert and cooperative. Normal mood and affect  Assessment and Recommendations:  1. Right lower quadrant abdominal pain and right back pain. Symptoms appear to be musculoskeletal in etiology. Advised him to follow-up with his PCP this week for further evaluation. Continue Advil and Aspercreme for now. No further GI evaluation recommended at this time.

## 2015-05-05 NOTE — Patient Instructions (Signed)
Discontinue your Miralax.  Your appointment with Dr. Tamala Julian is on 05/13/15 at 2:45pm.   Thank you for choosing me and [facility].  Pricilla Riffle. Dagoberto Ligas., MD., Marval Regal

## 2015-05-13 ENCOUNTER — Ambulatory Visit: Payer: Commercial Managed Care - HMO | Admitting: Family Medicine

## 2015-05-15 ENCOUNTER — Encounter: Payer: Self-pay | Admitting: Internal Medicine

## 2015-05-15 ENCOUNTER — Ambulatory Visit (INDEPENDENT_AMBULATORY_CARE_PROVIDER_SITE_OTHER): Payer: Commercial Managed Care - HMO

## 2015-05-15 VITALS — BP 126/80 | Ht 69.0 in | Wt 255.5 lb

## 2015-05-15 DIAGNOSIS — Z0000 Encounter for general adult medical examination without abnormal findings: Secondary | ICD-10-CM | POA: Diagnosis not present

## 2015-05-15 NOTE — Patient Instructions (Addendum)
Mr. Ifft , Thank you for taking time to come for your Medicare Wellness Visit. I appreciate your ongoing commitment to your health goals. Please review the following plan we discussed and let me know if I can assist you in the future.   Avoid high fructose corn syrup  NUTRITION Eat more plants: colorful vegetables, nuts, seeds and berries.  Eat less sugar, salt, preservatives and processed foods.  Avoid toxins such as cigarettes and alcohol.  Drink water when you are thirsty. Warm water with a slice of lemon is an excellent morning drink to start the day.  Consider these websites for more information [employer] (https://www.henry-hernandez.biz/) [employer] (WindowBlog.ch)    Are planning on getting eyes exam; will ask new MD for referral   Will stop and get medical release; Important Safety Information ZOSTAVAX does not protect everyone, so some people who get the vaccine may still get Shingles.  You should not get ZOSTAVAX if you are allergic to any of its ingredients, including gelatin or neomycin, have a weakened immune system, take high doses of steroids, or are pregnant or plan to become pregnant. You should not get ZOSTAVAX to prevent chickenpox.  Talk to your health care professional if you plan to get ZOSTAVAX at the same time as [id number] (Pneumococcal Vaccine Polyvalent) because it may be better to get these vaccines at least 4 weeks apart.  Possible side effects include redness, pain, itching, swelling, hard lump, warmth, or bruising at the injection site, as well as headache.  ZOSTAVAX contains a weakened chickenpox virus. Tell your health care professional if you will be in close contact with newborn infants, someone who may be pregnant and has not had chickenpox or been vaccinated against chickenpox, or someone who has problems with their immune system. Your health care professional can tell you what  situations you may need to avoid. You are encouraged to report negative side effects of prescription drugs to the FDA. Visit SmoothHits.hu, or call 1-800-FDA-1088.  Educated to check with insurance regarding coverage of Shingles vaccination on Part D or Part B and may have lower co-pay if provided on the Part D side   These are the goals we discussed: Goals    . Exercise 150 minutes per week (moderate activity)     Get back to regular routine in the gym; take care of home;        This is a list of the screening recommended for you and due dates:  Health Maintenance  Topic Date Due  . Shingles Vaccine  12/21/2005  . Flu Shot  12/15/2015  . Colon Cancer Screening  04/30/2018  . Tetanus Vaccine  09/18/2023  .  Hepatitis C: One time screening is recommended by Center for Disease Control  (CDC) for  adults born from [birth year] through 1965.   Completed  . Pneumonia vaccines  Completed     Fat and Cholesterol Restricted Diet High levels of fat and cholesterol in your blood may lead to various health problems, such as diseases of the heart, blood vessels, gallbladder, liver, and pancreas. Fats are concentrated sources of energy that come in various forms. Certain types of fat, including saturated fat, may be harmful in excess. Cholesterol is a substance needed by your body in small amounts. Your body makes all the cholesterol it needs. Excess cholesterol comes from the food you eat. When you have high levels of cholesterol and saturated fat in your blood, health problems can develop because the excess fat and cholesterol  will gather along the walls of your blood vessels, causing them to narrow. Choosing the right foods will help you control your intake of fat and cholesterol. This will help keep the levels of these substances in your blood within normal limits and reduce your risk of disease. WHAT IS MY PLAN? Your health care provider recommends that you:  Get no more than __________ % of  the total calories in your daily diet from fat.  Limit your intake of saturated fat to less than ______% of your total calories each day.  Limit the amount of cholesterol in your diet to less than _________mg per day. WHAT TYPES OF FAT SHOULD I CHOOSE?  Choose healthy fats more often. Choose monounsaturated and polyunsaturated fats, such as olive and canola oil, flaxseeds, walnuts, almonds, and seeds.  Eat more omega-3 fats. Good choices include salmon, mackerel, sardines, tuna, flaxseed oil, and ground flaxseeds. Aim to eat fish at least two times a week.  Limit saturated fats. Saturated fats are primarily found in animal products, such as meats, butter, and cream. Plant sources of saturated fats include palm oil, palm kernel oil, and coconut oil.  Avoid foods with partially hydrogenated oils in them. These contain trans fats. Examples of foods that contain trans fats are stick margarine, some tub margarines, cookies, crackers, and other baked goods. WHAT GENERAL GUIDELINES DO I NEED TO FOLLOW? These guidelines for healthy eating will help you control your intake of fat and cholesterol:  Check food labels carefully to identify foods with trans fats or high amounts of saturated fat.  Fill one half of your plate with vegetables and green salads.  Fill one fourth of your plate with whole grains. Look for the word "whole" as the first word in the ingredient list.  Fill one fourth of your plate with lean protein foods.  Limit fruit to two servings a day. Choose fruit instead of juice.  Eat more foods that contain soluble fiber. Examples of foods that contain this type of fiber are apples, broccoli, carrots, beans, peas, and barley. Aim to get 20-30 g of fiber per day.  Eat more home-cooked food and less restaurant, buffet, and fast food.  Limit or avoid alcohol.  Limit foods high in starch and sugar.  Limit fried foods.  Cook foods using methods other than frying. Baking, boiling,  grilling, and broiling are all great options.  Lose weight if you are overweight. Losing just 5-10% of your initial body weight can help your overall health and prevent diseases such as diabetes and heart disease. WHAT FOODS CAN I EAT? Grains Whole grains, such as whole wheat or whole grain breads, crackers, cereals, and pasta. Unsweetened oatmeal, bulgur, barley, quinoa, or brown rice. Corn or whole wheat flour tortillas. Vegetables Fresh or frozen vegetables (raw, steamed, roasted, or grilled). Green salads. Fruits All fresh, canned (in natural juice), or frozen fruits. Meat and Other Protein Products Ground beef (85% or leaner), grass-fed beef, or beef trimmed of fat. Skinless chicken or [place]. Ground chicken or [place]. Pork trimmed of fat. All fish and seafood. Eggs. Dried beans, peas, or lentils. Unsalted nuts or seeds. Unsalted canned or dry beans. Dairy Low-fat dairy products, such as skim or 1% milk, 2% or reduced-fat cheeses, low-fat ricotta or cottage cheese, or plain low-fat yogurt. Fats and Oils Tub margarines without trans fats. Light or reduced-fat mayonnaise and salad dressings. Avocado. Olive, canola, sesame, or safflower oils. Natural peanut or almond butter (choose ones without added sugar and oil). The  items listed above may not be a complete list of recommended foods or beverages. Contact your dietitian for more options. WHAT FOODS ARE NOT RECOMMENDED? Grains White bread. White pasta. White rice. Cornbread. Bagels, pastries, and croissants. Crackers that contain trans fat. Vegetables White potatoes. Corn. Creamed or fried vegetables. Vegetables in a cheese sauce. Fruits Dried fruits. Canned fruit in light or heavy syrup. Fruit juice. Meat and Other Protein Products Fatty cuts of meat. Ribs, chicken wings, bacon, sausage, bologna, salami, chitterlings, fatback, hot dogs, bratwurst, and packaged luncheon meats. Liver and organ meats. Dairy Whole or 2% milk, cream,  half-and-half, and cream cheese. Whole milk cheeses. Whole-fat or sweetened yogurt. Full-fat cheeses. Nondairy creamers and whipped toppings. Processed cheese, cheese spreads, or cheese curds. Sweets and Desserts Corn syrup, sugars, honey, and molasses. Candy. Jam and jelly. Syrup. Sweetened cereals. Cookies, pies, cakes, donuts, muffins, and ice cream. Fats and Oils Butter, stick margarine, lard, shortening, ghee, or bacon fat. Coconut, palm kernel, or palm oils. Beverages Alcohol. Sweetened drinks (such as sodas, lemonade, and fruit drinks or punches). The items listed above may not be a complete list of foods and beverages to avoid. Contact your dietitian for more information.   This information is not intended to replace advice given to you by your health care provider. Make sure you discuss any questions you have with your health care provider.   Document Released: 05/02/2005 Document Revised: 05/23/2014 Document Reviewed: 07/31/2013 Elsevier Interactive Patient Education 2016 [place] in the Home  Falls can cause injuries. They can happen to people of all ages. There are many things you can do to make your home safe and to help prevent falls.  WHAT CAN I DO ON THE OUTSIDE OF MY HOME?  Regularly fix the edges of walkways and driveways and fix any cracks.  Remove anything that might make you trip as you walk through a door, such as a raised step or threshold.  Trim any bushes or trees on the path to your home.  Use bright outdoor lighting.  Clear any walking paths of anything that might make someone trip, such as rocks or tools.  Regularly check to see if handrails are loose or broken. Make sure that both sides of any steps have handrails.  Any raised decks and porches should have guardrails on the edges.  Have any leaves, snow, or ice cleared regularly.  Use sand or salt on walking paths during winter.  Clean up any spills in your garage right away.  This includes oil or grease spills. WHAT CAN I DO IN THE BATHROOM?   Use night lights.  Install grab bars by the toilet and in the tub and shower. Do not use towel bars as grab bars.  Use non-skid mats or decals in the tub or shower.  If you need to sit down in the shower, use a plastic, non-slip stool.  Keep the floor dry. Clean up any water that spills on the floor as soon as it happens.  Remove soap buildup in the tub or shower regularly.  Attach bath mats securely with double-sided non-slip rug tape.  Do not have throw rugs and other things on the floor that can make you trip. WHAT CAN I DO IN THE BEDROOM?  Use night lights.  Make sure that you have a light by your bed that is easy to reach.  Do not use any sheets or blankets that are too big for your bed. They should not hang down  onto the floor.  Have a firm chair that has side arms. You can use this for support while you get dressed.  Do not have throw rugs and other things on the floor that can make you trip. WHAT CAN I DO IN THE KITCHEN?  Clean up any spills right away.  Avoid walking on wet floors.  Keep items that you use a lot in easy-to-reach places.  If you need to reach something above you, use a strong step stool that has a grab bar.  Keep electrical cords out of the way.  Do not use floor polish or wax that makes floors slippery. If you must use wax, use non-skid floor wax.  Do not have throw rugs and other things on the floor that can make you trip. WHAT CAN I DO WITH MY STAIRS?  Do not leave any items on the stairs.  Make sure that there are handrails on both sides of the stairs and use them. Fix handrails that are broken or loose. Make sure that handrails are as long as the stairways.  Check any carpeting to make sure that it is firmly attached to the stairs. Fix any carpet that is loose or worn.  Avoid having throw rugs at the top or bottom of the stairs. If you do have throw rugs, attach them  to the floor with carpet tape.  Make sure that you have a light switch at the top of the stairs and the bottom of the stairs. If you do not have them, ask someone to add them for you. WHAT ELSE CAN I DO TO HELP PREVENT FALLS?  Wear shoes that:  Do not have high heels.  Have rubber bottoms.  Are comfortable and fit you well.  Are closed at the toe. Do not wear sandals.  If you use a stepladder:  Make sure that it is fully opened. Do not climb a closed stepladder.  Make sure that both sides of the stepladder are locked into place.  Ask someone to hold it for you, if possible.  Clearly mark and make sure that you can see:  Any grab bars or handrails.  First and last steps.  Where the edge of each step is.  Use tools that help you move around (mobility aids) if they are needed. These include:  Canes.  Walkers.  Scooters.  Crutches.  Turn on the lights when you go into a dark area. Replace any light bulbs as soon as they burn out.  Set up your furniture so you have a clear path. Avoid moving your furniture around.  If any of your floors are uneven, fix them.  If there are any pets around you, be aware of where they are.  Review your medicines with your doctor. Some medicines can make you feel dizzy. This can increase your chance of falling. Ask your doctor what other things that you can do to help prevent falls.   This information is not intended to replace advice given to you by your health care provider. Make sure you discuss any questions you have with your health care provider.   Document Released: 02/26/2009 Document Revised: 09/16/2014 Document Reviewed: 06/06/2014 Elsevier Interactive Patient Education 2016 [place] Maintenance, Male A healthy lifestyle and preventative care can promote health and wellness.  Maintain regular health, dental, and eye exams.  Eat a healthy diet. Foods like vegetables, fruits, whole grains, low-fat dairy  products, and lean protein foods contain the nutrients you need and are  low in calories. Decrease your intake of foods high in solid fats, added sugars, and salt. Get information about a proper diet from your health care provider, if necessary.  Regular physical exercise is one of the most important things you can do for your health. Most adults should get at least 150 minutes of moderate-intensity exercise (any activity that increases your heart rate and causes you to sweat) each week. In addition, most adults need muscle-strengthening exercises on 2 or more days a week.   Maintain a healthy weight. The body mass index (BMI) is a screening tool to identify possible weight problems. It provides an estimate of body fat based on height and weight. Your health care provider can find your BMI and can help you achieve or maintain a healthy weight. For males 20 years and older:  A BMI below 18.5 is considered underweight.  A BMI of 18.5 to 24.9 is normal.  A BMI of 25 to 29.9 is considered overweight.  A BMI of 30 and above is considered obese.  Maintain normal blood lipids and cholesterol by exercising and minimizing your intake of saturated fat. Eat a balanced diet with plenty of fruits and vegetables. Blood tests for lipids and cholesterol should begin at age 58 and be repeated every 5 years. If your lipid or cholesterol levels are high, you are over age 46, or you are at high risk for heart disease, you may need your cholesterol levels checked more frequently.Ongoing high lipid and cholesterol levels should be treated with medicines if diet and exercise are not working.  If you smoke, find out from your health care provider how to quit. If you do not use tobacco, do not start.  Lung cancer screening is recommended for adults aged 40-80 years who are at high risk for developing lung cancer because of a history of smoking. A yearly low-dose CT scan of the lungs is recommended for people who have at  least a 30-pack-year history of smoking and are current smokers or have quit within the past 15 years. A pack year of smoking is smoking an average of 1 pack of cigarettes a day for 1 year (for example, a 30-pack-year history of smoking could mean smoking 1 pack a day for 30 years or 2 packs a day for 15 years). Yearly screening should continue until the smoker has stopped smoking for at least 15 years. Yearly screening should be stopped for people who develop a health problem that would prevent them from having lung cancer treatment.  If you choose to drink alcohol, do not have more than 2 drinks per day. One drink is considered to be 12 oz (360 mL) of beer, 5 oz (150 mL) of wine, or 1.5 oz (45 mL) of liquor.  Avoid the use of street drugs. Do not share needles with anyone. Ask for help if you need support or instructions about stopping the use of drugs.  High blood pressure causes heart disease and increases the risk of stroke. High blood pressure is more likely to develop in:  People who have blood pressure in the end of the normal range (100-139/85-89 mm Hg).  People who are overweight or obese.  People who are African American.  If you are 44-96 years of age, have your blood pressure checked every 3-5 years. If you are 36 years of age or older, have your blood pressure checked every year. You should have your blood pressure measured twice--once when you are at a hospital or clinic,  and once when you are not at a hospital or clinic. Record the average of the two measurements. To check your blood pressure when you are not at a hospital or clinic, you can use:  An automated blood pressure machine at a pharmacy.  A home blood pressure monitor.  If you are 11-76 years old, ask your health care provider if you should take aspirin to prevent heart disease.  Diabetes screening involves taking a blood sample to check your fasting blood sugar level. This should be done once every 3 years after age  84 if you are at a normal weight and without risk factors for diabetes. Testing should be considered at a younger age or be carried out more frequently if you are overweight and have at least 1 risk factor for diabetes.  Colorectal cancer can be detected and often prevented. Most routine colorectal cancer screening begins at the age of 64 and continues through age 26. However, your health care provider may recommend screening at an earlier age if you have risk factors for colon cancer. On a yearly basis, your health care provider may provide home test kits to check for hidden blood in the stool. A small camera at the end of a tube may be used to directly examine the colon (sigmoidoscopy or colonoscopy) to detect the earliest forms of colorectal cancer. Talk to your health care provider about this at age 47 when routine screening begins. A direct exam of the colon should be repeated every 5-10 years through age 73, unless early forms of precancerous polyps or small growths are found.  People who are at an increased risk for hepatitis B should be screened for this virus. You are considered at high risk for hepatitis B if:  You were born in a country where hepatitis B occurs often. Talk with your health care provider about which countries are considered high risk.  Your parents were born in a high-risk country and you have not received a shot to protect against hepatitis B (hepatitis B vaccine).  You have HIV or AIDS.  You use needles to inject street drugs.  You live with, or have sex with, someone who has hepatitis B.  You are a man who has sex with other men (MSM).  You get hemodialysis treatment.  You take certain medicines for conditions like cancer, organ transplantation, and autoimmune conditions.  Hepatitis C blood testing is recommended for all people born from [birth year] through 1965 and any individual with known risk factors for hepatitis C.  Healthy men should no longer receive  prostate-specific antigen (PSA) blood tests as part of routine cancer screening. Talk to your health care provider about prostate cancer screening.  Testicular cancer screening is not recommended for adolescents or adult males who have no symptoms. Screening includes self-exam, a health care provider exam, and other screening tests. Consult with your health care provider about any symptoms you have or any concerns you have about testicular cancer.  Practice safe sex. Use condoms and avoid high-risk sexual practices to reduce the spread of sexually transmitted infections (STIs).  You should be screened for STIs, including gonorrhea and chlamydia if:  You are sexually active and are younger than 24 years.  You are older than 24 years, and your health care provider tells you that you are at risk for this type of infection.  Your sexual activity has changed since you were last screened, and you are at an increased risk for chlamydia or gonorrhea. Ask your health  care provider if you are at risk.  If you are at risk of being infected with HIV, it is recommended that you take a prescription medicine daily to prevent HIV infection. This is called pre-exposure prophylaxis (PrEP). You are considered at risk if:  You are a man who has sex with other men (MSM).  You are a heterosexual man who is sexually active with multiple partners.  You take drugs by injection.  You are sexually active with a partner who has HIV.  Talk with your health care provider about whether you are at high risk of being infected with HIV. If you choose to begin PrEP, you should first be tested for HIV. You should then be tested every 3 months for as long as you are taking PrEP.  Use sunscreen. Apply sunscreen liberally and repeatedly throughout the day. You should seek shade when your shadow is shorter than you. Protect yourself by wearing long sleeves, pants, a wide-brimmed hat, and sunglasses year round whenever you are  outdoors.  Tell your health care provider of new moles or changes in moles, especially if there is a change in shape or color. Also, tell your health care provider if a mole is larger than the size of a pencil eraser.  A one-time screening for abdominal aortic aneurysm (AAA) and surgical repair of large AAAs by ultrasound is recommended for men aged 41-75 years who are current or former smokers.  Stay current with your vaccines (immunizations).   This information is not intended to replace advice given to you by your health care provider. Make sure you discuss any questions you have with your health care provider.   Document Released: 10/29/2007 Document Revised: 05/23/2014 Document Reviewed: 09/27/2010 Elsevier Interactive Patient Education 2016 [employer]  Fat and Cholesterol Restricted Diet High levels of fat and cholesterol in your blood may lead to various health problems, such as diseases of the heart, blood vessels, gallbladder, liver, and pancreas. Fats are concentrated sources of energy that come in various forms. Certain types of fat, including saturated fat, may be harmful in excess. Cholesterol is a substance needed by your body in small amounts. Your body makes all the cholesterol it needs. Excess cholesterol comes from the food you eat. When you have high levels of cholesterol and saturated fat in your blood, health problems can develop because the excess fat and cholesterol will gather along the walls of your blood vessels, causing them to narrow. Choosing the right foods will help you control your intake of fat and cholesterol. This will help keep the levels of these substances in your blood within normal limits and reduce your risk of disease. WHAT IS MY PLAN? Your health care provider recommends that you:  Get no more than __________ % of the total calories in your daily diet from fat.  Limit your intake of saturated fat to less than ______% of your total calories each  day.  Limit the amount of cholesterol in your diet to less than _________mg per day. WHAT TYPES OF FAT SHOULD I CHOOSE?  Choose healthy fats more often. Choose monounsaturated and polyunsaturated fats, such as olive and canola oil, flaxseeds, walnuts, almonds, and seeds.  Eat more omega-3 fats. Good choices include salmon, mackerel, sardines, tuna, flaxseed oil, and ground flaxseeds. Aim to eat fish at least two times a week.  Limit saturated fats. Saturated fats are primarily found in animal products, such as meats, butter, and cream. Plant sources of saturated fats include palm oil, palm  kernel oil, and coconut oil.  Avoid foods with partially hydrogenated oils in them. These contain trans fats. Examples of foods that contain trans fats are stick margarine, some tub margarines, cookies, crackers, and other baked goods. WHAT GENERAL GUIDELINES DO I NEED TO FOLLOW? These guidelines for healthy eating will help you control your intake of fat and cholesterol:  Check food labels carefully to identify foods with trans fats or high amounts of saturated fat.  Fill one half of your plate with vegetables and green salads.  Fill one fourth of your plate with whole grains. Look for the word "whole" as the first word in the ingredient list.  Fill one fourth of your plate with lean protein foods.  Limit fruit to two servings a day. Choose fruit instead of juice.  Eat more foods that contain soluble fiber. Examples of foods that contain this type of fiber are apples, broccoli, carrots, beans, peas, and barley. Aim to get 20-30 g of fiber per day.  Eat more home-cooked food and less restaurant, buffet, and fast food.  Limit or avoid alcohol.  Limit foods high in starch and sugar.  Limit fried foods.  Cook foods using methods other than frying. Baking, boiling, grilling, and broiling are all great options.  Lose weight if you are overweight. Losing just 5-10% of your initial body weight can  help your overall health and prevent diseases such as diabetes and heart disease. WHAT FOODS CAN I EAT? Grains Whole grains, such as whole wheat or whole grain breads, crackers, cereals, and pasta. Unsweetened oatmeal, bulgur, barley, quinoa, or brown rice. Corn or whole wheat flour tortillas. Vegetables Fresh or frozen vegetables (raw, steamed, roasted, or grilled). Green salads. Fruits All fresh, canned (in natural juice), or frozen fruits. Meat and Other Protein Products Ground beef (85% or leaner), grass-fed beef, or beef trimmed of fat. Skinless chicken or [place]. Ground chicken or [place]. Pork trimmed of fat. All fish and seafood. Eggs. Dried beans, peas, or lentils. Unsalted nuts or seeds. Unsalted canned or dry beans. Dairy Low-fat dairy products, such as skim or 1% milk, 2% or reduced-fat cheeses, low-fat ricotta or cottage cheese, or plain low-fat yogurt. Fats and Oils Tub margarines without trans fats. Light or reduced-fat mayonnaise and salad dressings. Avocado. Olive, canola, sesame, or safflower oils. Natural peanut or almond butter (choose ones without added sugar and oil). The items listed above may not be a complete list of recommended foods or beverages. Contact your dietitian for more options. WHAT FOODS ARE NOT RECOMMENDED? Grains White bread. White pasta. White rice. Cornbread. Bagels, pastries, and croissants. Crackers that contain trans fat. Vegetables White potatoes. Corn. Creamed or fried vegetables. Vegetables in a cheese sauce. Fruits Dried fruits. Canned fruit in light or heavy syrup. Fruit juice. Meat and Other Protein Products Fatty cuts of meat. Ribs, chicken wings, bacon, sausage, bologna, salami, chitterlings, fatback, hot dogs, bratwurst, and packaged luncheon meats. Liver and organ meats. Dairy Whole or 2% milk, cream, half-and-half, and cream cheese. Whole milk cheeses. Whole-fat or sweetened yogurt. Full-fat cheeses. Nondairy creamers and whipped  toppings. Processed cheese, cheese spreads, or cheese curds. Sweets and Desserts Corn syrup, sugars, honey, and molasses. Candy. Jam and jelly. Syrup. Sweetened cereals. Cookies, pies, cakes, donuts, muffins, and ice cream. Fats and Oils Butter, stick margarine, lard, shortening, ghee, or bacon fat. Coconut, palm kernel, or palm oils. Beverages Alcohol. Sweetened drinks (such as sodas, lemonade, and fruit drinks or punches). The items listed above may not be a complete  list of foods and beverages to avoid. Contact your dietitian for more information.   This information is not intended to replace advice given to you by your health care provider. Make sure you discuss any questions you have with your health care provider.   Document Released: 05/02/2005 Document Revised: 05/23/2014 Document Reviewed: 07/31/2013 Elsevier Interactive Patient Education [employer].

## 2015-05-15 NOTE — Progress Notes (Signed)
Subjective:   Eugene [name] is a 69 y.o. male who presents for Medicare Annual/Subsequent preventive examination.  Review of Systems:  HRA assessment completed during visit; [place] The Patient was informed that this wellness visit is to identify risk and educate on how to reduce risk for increase disease through lifestyle changes.   ROS deferred to CPE exam with physician Recent ER visit; C/o of back pain on right and radiating to lower abdomen;  Went to Dr. Annamaria Boots for allergies and he referred to Dr. Fuller Plan; (recent ER visit on 12/12 was "constipation") Dr. Fuller Plan told the patient he did not have any abd issues. Looked up his own symptoms and feels he has DDD  States this is different from the back problem in May  States this pain started when he went to bathroom and couldn't get up  Pain decreases when lying  1-10'; right now it is a 3 or 4 now; pain is gradually easing but is still there;  Seeing chiropractor many years ago and got a back brace which he is wearing today.   Will be seeing doctor Milus Glazier Prefers to transfer care nearer to home; Given release for medical information to be transferred to his new provider once he is seen; Educated he could still see a provider here or fup with Dr. Tamala Julian if he would like.   Mother died with CHF; oldest brother had melanoma skin cancer. Father; was hospitalized x 61 days: running a fever; not sure what he died of. Also has rotator cuffon the right; in therapy currently and is resolving.  Medical issues  HTN; OSA; OA; obesity (chol 145; Trig 99; HDL 42; LDL 83; Ratio 3) A1c 4.6   BMI: 37.7 has gained weight with shoulder being out and back pain x 3 months;  Asked about how he loses weight; 245 goal; will try to lose the 10 lbs;  Exercise; go to the gym for 1.5 hours biw; bike, elliptical; abd machine ak on hold due to bike  Diet; Breakfast; banana, eggs; sausage; toast;  Doesn't drink coffee; sometimes sodas; caffeine free;    Lunch; subway; 6" sub; bojangles with 2 piece meals Supper; wife cooks; peas and carrots and green beans; chicken of fish;  Likes ice cream bars;   Exercise; was going to [employer]; doing the bikes; Abd machines; walking in gym; walking x 10 to 15 minutes; went x 2 per week; During the day, generally does yard work;   SAFETY/ one level;  Safety reviewed for the home; clear paths through the home, eliminating clutter, railing as needed (no)  bathroom safety; takes showers; community safety; smoke detectors; yes; and firearms safety reviewed  Driving accidents No and seatbelt Sun protection; always cover up; straw hat that he wears;  Stressors; no   Medication review/ New meds  Fall assessment / no falls  Gait assessment; good   Mobilization and Functional losses in the last year./ go up and down stairs  Sleep patterns; off right now; pain wakes him up; CPAP; has been using it;  Will go back on it as the back pain resolves;   Urinary or fecal incontinence reviewed/ no    Counseling: hep c was negative Colonoscopy; 04/2008; 5 to 10 years; HYPERPLASTIC POLYP.  - THERE IS NO EVIDENCE OF MALIGNANCY Due in 12/ 2019 EKG: 03/25/2014 Hearing: 4000hz   PSA 1.73 Ophthalmology exam; has been x 2 years; will try to get this done/ has floaters  Immunizations Due  Zostavax; educated and declines currently  Current Care Team reviewed and updated   Cardiac Risk Factors include: advanced age (>71men, >13 women);family history of premature cardiovascular disease     Objective:    Vitals: BP 126/80 mmHg  Ht 5\' 9"  (1.753 m)  Wt 255 lb 8 oz (115.894 kg)  BMI 37.71 kg/m2  Tobacco History  Smoking status  . Never Smoker   Smokeless tobacco  . Not on file     Counseling given: Yes   Past Medical History  Diagnosis Date  . Hypertension   . Rheumatic fever     residual pulm stenosis, follows with ganji every 57mo  . Anxiety   . Allergic rhinitis due to pollen   . ED (erectile  dysfunction)   . OBESITY   . Osteoarthritis   . RHINITIS, CHRONIC   . SLEEP APNEA, OBSTRUCTIVE dx 2000    NPSG 2000:  AHI 76/hr CPAP titrated to 11cm 2002   . Pulmonary stenosis, valvar     TEE 07/2011: stable, LVEF 55%, small PFO  . Refusal of blood transfusions as patient is Jehovah's Witness   . DDD (degenerative disc disease), cervical     Severe w/ osteophytes on xray 3/16   Past Surgical History  Procedure Laterality Date  . Tee without cardioversion  08/02/2011    Procedure: TRANSESOPHAGEAL ECHOCARDIOGRAM (TEE);  Surgeon: Laverda Page, MD;  Location: [facility] ENDOSCOPY;  Service: Cardiovascular;  Laterality: N/A;   Family History  Problem Relation Age of Onset  . Heart disease Mother   . Heart disease Father   . Hypertension Other    History  Sexual Activity  . Sexual Activity: Yes    Outpatient Encounter Prescriptions as of 05/15/2015  Medication Sig  . amLODipine (NORVASC) 5 MG tablet Take 1 tablet (5 mg total) by mouth daily.  Marland Kitchen azelastine (ASTELIN) 0.1 % nasal spray SPRAY ONCE TO TWICE IN EACH NOSTRIL TWICE DAILY AS NEEDED  . carvedilol (COREG) 25 MG tablet Take 1 tablet (25 mg total) by mouth 2 (two) times daily with a meal.  . Cholecalciferol (VITAMIN D) 2000 UNITS CAPS Take 2,000 Units by mouth daily.  . clonazePAM (KLONOPIN) 1 MG tablet Take 1 tablet (1 mg total) by mouth at bedtime.  . DYMISTA 137-50 MCG/ACT SUSP PLACE 2 PUFFS INTO THE NOSE ONCE A DAY  . fluticasone (FLONASE) 50 MCG/ACT nasal spray SHAKE WELL AND USE 1 TO 2 SPRAYS IN EACH NOSTRIL EVERY DAY  . gabapentin (NEURONTIN) 100 MG capsule Take 2 capsules (200 mg total) by mouth at bedtime.  Marland Kitchen ibuprofen (ADVIL,MOTRIN) 800 MG tablet Take 1 tablet (800 mg total) by mouth 3 (three) times daily.  Marland Kitchen loratadine (CLARITIN) 10 MG tablet Take 10 mg by mouth daily as needed for allergies.  Marland Kitchen losartan-hydrochlorothiazide (HYZAAR) 50-12.5 MG per tablet Take 1 tablet by mouth 2 (two) times daily.  . Misc Natural  Products (OSTEO BI-FLEX ADV DOUBLE ST PO) Take 2 capsules by mouth daily.  . nabumetone (RELAFEN) 750 MG tablet Take 1 tablet (750 mg total) by mouth 2 (two) times daily.  . Omega-3 Fatty Acids (FISH OIL) 1000 MG CAPS Take 1,000 mg by mouth 2 (two) times daily.  . polyethylene glycol powder (GLYCOLAX/MIRALAX) powder Take 17 g by mouth 2 (two) times daily.  . Turmeric 500 MG CAPS Take 500 mg by mouth daily.  Marland Kitchen VIAGRA 100 MG tablet Take 1 tablet (100 mg total) by mouth as needed for erectile dysfunction.   No facility-administered encounter medications on file as of  05/15/2015.    Activities of Daily Living In your present state of health, do you have any difficulty performing the following activities: 05/15/2015  Hearing? N  Vision? N  Difficulty concentrating or making decisions? N  Walking or climbing stairs? N  Dressing or bathing? N  Doing errands, shopping? N  Preparing Food and eating ? N  Using the Toilet? N  In the past six months, have you accidently leaked urine? N  Do you have problems with loss of bowel control? N  Managing your Medications? N  Managing your Finances? N  Housekeeping or managing your Housekeeping? N    Patient Care Team: Rowe Clack, MD as PCP - General (Internal Medicine) Kathee Delton, MD (Pulmonary Disease) Adrian Prows, MD (Cardiology) Rozetta Nunnery, MD (Otolaryngology) Deneise Lever, MD (Pulmonary Disease) Marchia Bond, MD (Orthopedic Surgery) Sable Feil, MD (Gastroenterology) Lyndal Pulley, DO (Sports Medicine)   Assessment:    Assessment   Patient presents for yearly preventative medicine examination. Medicare questionnaire screening were completed, i.e. Functional; fall risk; depression, memory loss and hearing all unremarkable  All immunizations and health maintenance protocols were reviewed with the patient; Educated on the zostavax.   Education provided for laboratory screens;  Educated regarding lipids    Medication reconciliation, past medical history, social history, problem list and allergies were reviewed in detail with the patient  Goals were established with regard to weight loss and exercise,   End of life planning was discussed and has been completed  Exercise Activities and Dietary recommendations Current Exercise Habits:: Structured exercise class, Type of exercise: strength training/weights;walking (currently in PT for shoulder restoration), Time (Minutes): > 60, Frequency (Times/Week): 2, Weekly Exercise (Minutes/Week): 0, Intensity: Moderate  Goals    . Exercise 150 minutes per week (moderate activity)     Get back to regular routine in the gym; take care of home;       Fall Risk Fall Risk  05/15/2015 12/01/2014 09/17/2013 09/18/2012  Falls in the past year? No No No No   Depression Screen PHQ 2/9 Scores 05/15/2015 12/01/2014 09/17/2013 09/18/2012  PHQ - 2 Score 0 0 0 0    Cognitive Testing No flowsheet data found.   Ad8 score 0   Immunization History  Administered Date(s) Administered  . Influenza Split 02/09/2012  . Influenza, High Dose Seasonal PF 03/25/2013, 03/25/2014  . Influenza,inj,Quad PF,36+ Mos 05/04/2015  . Pneumococcal Conjugate-13 12/01/2014  . Pneumococcal Polysaccharide-23 09/18/2012  . Td 05/17/2003, 09/17/2013   Screening Tests Health Maintenance  Topic Date Due  . ZOSTAVAX  12/21/2005  . INFLUENZA VACCINE  12/15/2015  . COLONOSCOPY  04/30/2018  . TETANUS/TDAP  09/18/2023  . Hepatitis C Screening  Completed  . PNA vac Low Risk Adult  Completed      Plan:     Asked about "nutritious eating"   Educated to Avoid high fructose corn syrup  Eat more plants: colorful vegetables, nuts, seeds and berries.  Eat less sugar, salt, preservatives and processed foods.  Drink water when you are thirsty. Warm water with a slice of lemon is an excellent morning drink to start the day.  Educated to eat "clean" foods around the perimeter of the  grocery store that are natural without preservatives .  Are planning on getting eyes exam; will ask new MD for referral   Will stop and get medical release;   Educated to check with insurance regarding coverage of Shingles vaccination on Part D or Part B and  may have lower co-pay if provided on the Part D side   During the course of the visit the patient was educated and counseled about the following appropriate screening and preventive services:   Vaccines to include Pneumoccal, Influenza, Hepatitis B, Td, Zostavax, HCV/ reviewed and up to date except shingles; declines zoster today but was educated  Electrocardiogram/03/25/2004  Cardiovascular Disease/ BP good; BMI elevated; Educated on relationship between BMI and heart disease   Colorectal cancer screening -due 04/2018  Diabetes screening/ completed; normal  Prostate Cancer Screening/. Completed   Glaucoma screening/will ask for referral with new primary care doctor in [place] at his next apt.  Nutrition counseling / provided   Smoking cessation counseling/n/a  Patient Instructions (the written plan) was given to the patient.    Wynetta Fines, RN  05/15/2015

## 2015-06-01 ENCOUNTER — Other Ambulatory Visit: Payer: Self-pay | Admitting: Internal Medicine

## 2015-06-02 ENCOUNTER — Ambulatory Visit: Payer: Commercial Managed Care - HMO | Admitting: Internal Medicine

## 2015-06-30 ENCOUNTER — Other Ambulatory Visit: Payer: Self-pay | Admitting: Internal Medicine

## 2015-07-13 ENCOUNTER — Other Ambulatory Visit: Payer: Self-pay | Admitting: Internal Medicine

## 2015-08-04 ENCOUNTER — Encounter: Payer: Commercial Managed Care - HMO | Admitting: Internal Medicine

## 2015-08-31 ENCOUNTER — Other Ambulatory Visit: Payer: Self-pay | Admitting: Internal Medicine

## 2015-09-07 ENCOUNTER — Other Ambulatory Visit: Payer: Self-pay | Admitting: Internal Medicine

## 2015-09-10 ENCOUNTER — Other Ambulatory Visit: Payer: Self-pay | Admitting: Internal Medicine

## 2015-11-02 ENCOUNTER — Ambulatory Visit (INDEPENDENT_AMBULATORY_CARE_PROVIDER_SITE_OTHER): Payer: Medicare HMO | Admitting: Internal Medicine

## 2015-11-02 ENCOUNTER — Encounter: Payer: Self-pay | Admitting: Internal Medicine

## 2015-11-02 VITALS — BP 120/76 | HR 72 | Ht 69.0 in | Wt 257.0 lb

## 2015-11-02 DIAGNOSIS — J309 Allergic rhinitis, unspecified: Secondary | ICD-10-CM | POA: Diagnosis not present

## 2015-11-02 DIAGNOSIS — G4733 Obstructive sleep apnea (adult) (pediatric): Secondary | ICD-10-CM | POA: Diagnosis not present

## 2015-11-02 DIAGNOSIS — J3089 Other allergic rhinitis: Secondary | ICD-10-CM

## 2015-11-02 DIAGNOSIS — J302 Other seasonal allergic rhinitis: Secondary | ICD-10-CM

## 2015-11-02 NOTE — Patient Instructions (Signed)
Order-  Schedule unattended home sleep test     Dx OSA  Please call as needed

## 2015-11-02 NOTE — Progress Notes (Signed)
09/15/11- 15 yoM never smoker self referred for allergy evaluation. Former patient at [facility]. Now followed here by Dr Gwenette Greet for OSA. Old records being obtained. He complains that for 4 or 5 months he has had numbness in the right side of his face "like at dentist". Right eye burns and walkers. The vision is okay and hearing okay. No discomfort with his teeth. This sensation has been persistent without change. Dr Gerald Stabs Newman/ ENT did CT scan of sinuses on 04/01/2011 describing mild mucosal thickening with no air-fluid levels. 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 No mass. He says nasal congestion is such that he has to mouth breathe but congestion shifts back and forth. He denies ENT trauma or surgery. Has been taking Astelin and fluticasone nasal sprays with little benefit. 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  10/20/11- 54 yoM never smoker self referred for allergy evaluation. Former patient at [facility]. Now followed here by Dr Gwenette Greet for OSA. At last visit we gave him a nasal nebulizer treatment and sample of Dymista, which seemed to help. When the sample ran out, he went back to [facility] which doesn't work as well. He is just using Astelin now. Last week he had a viral chest cold with hard cough since then has had a dull persistent substernal ache increased by reaching or lifting with his arms. Still has some productive cough with white sputum. Some wheeze.  02/20/12- 69 yoM never smoker followed by CDY for allergy . Former patient at [facility]. Now followed here by Dr Gwenette Greet for OSA.  last Thursday had sinus headache and could not get rid of; overall has good and bad days; congestion at times. He had been outdoors all day and the next day developed persistent frontal headache. Headache is now clear but he has residual nasal congestion. Dymista nasal spray has been some help. He asks about chronic coated tongue despite rush but with  his toothbrush and using mouthwash. He is not diabetic. Aware of postnasal drip.  08/20/12- 66 yoM never smoker followed by CDY for allergic rhinitis . Former patient at [facility]. Now followed here by Dr Gwenette Greet for OSA. FOLLOWS FOR: bloody nasal drainage for about past 2 months-comes and goes(mornings are normally the worst time for patient). Using Astelin and loratadine potential for overdrying. Tongue is coated. Remains on CPAP with humidifier, supervised by Dr. Gwenette Greet. Stopped allergy vaccine 1:10 GO  02/18/13- 67 yoM never smoker followed by CDY for allergic rhinitis . Former patient at [facility]. Now followed here by Dr Gwenette Greet for OSA/ CPAP. FOLLOWS FOR: Has "clogged" days and others "feels clear" Stopped allergy vaccine 1:10 GO Shifting nasal stuffiness usually worse on R side despite otc decongestants. Ears ok.  Known septal deviation and mild chronic sinusitis on CT maxfac 2012. Had unsatisfactory visit with Dr Lucia Gaskins then- never heard back.  08/19/13- 67 yoM never smoker followed by CDY for allergic rhinitis . Former patient at [facility]. Now followed here by Dr Gwenette Greet for OSA/ CPAP. FOLLOWS FOR: sinus pressure since mid March 2015-has not been on allergy vaccine in years. Saw Dr. Lovenia Shuck about persistent nasal stuffiness. He was not a surgical candidate but told to use decongestant spray just at night. He is not having much seasonal itching or sneezing but uses Astelin nasal spray before he goes outdoors  08/22/14-  68 yoM never smoker followed by [facility]Y for allergic rhinitis, hx chronic sinusitis . Former patient at [facility]. FOLLOW FOR:  allergies.  very congested x 1 month.  sinus pressure, sinus congestion.   Increased nasal congestion  especially within the past 7-10 days blamed on pollen Using azelastine nasal spray. No chest tightness or wheeze. No headache or purulent discharge.  10/30/14- 68 yoM never smoker followed by CDY for allergic rhinitis, OSA, hx chronic sinusitis . Former patient at  [facility]. Follows For: Pt states allergies have improved. Pt states flonase and azelastine nasal spray have helped.  CPAP Auto/ [facility] doing fine.  Rhinitis controlled much better after allergy season. Patient's meds help. We discussed availability of sublingual immunotherapy =future consideration.  05/04/2015-70 year old male never smoker followed for allergic rhinitis, OSA, history chronic sinusitis CPAP [facility]/ [facility] FOLLOWS FOR: DME is [facility]-pt states he placed "card" in mail yesterday to [place] for compliance report. Pt has not used CPAP much recently due to rotator cuff injury (right shoulder).  11/02/2015-70 year old male never smoker followed for Allergic rhinitis, OSA, history chronic sinusitis CPAP auto/[facility] FOLLOWS FOR: DME: [facility];Pt has not used CPAP in about 3 months; no data on card; pt states he is having issues with his spine then also has questions about his sinus area-has sounds in his ears. Has not used CPAP in months. Blames a variety of somatic complaints. Seeing the chiropractor for spine problems. We discussed alternatives to CPAP and he may be a candidate for an oral appliance. Sleep study would need to be updated. He only had about 2 weeks of allergic nasal congestion in mid pollen season but otherwise says this is been a good year for nasal symptoms.  ROS-see HPI Constitutional:   No-   weight loss, night sweats, fevers, chills, fatigue, lassitude. HEENT:   No-  headaches, difficulty swallowing, tooth/dental problems, sore throat,       No- sneezing, itching, ear ache, +nasal congestion, no-post nasal drip,  CV:  No-   chest pain, orthopnea, PND, swelling in lower extremities, anasarca, dizziness, palpitations Resp: No-   shortness of breath with exertion or at rest.              No-   productive cough,  No non-productive cough,  No- coughing up of blood.              No-   change in color of mucus.  No- wheezing.   Skin: No-   rash or lesions. GI:  No-   heartburn,  indigestion, abdominal pain, nausea, vomiting,  GU:   MS:  No-   joint pain or swelling.   Neuro-     nothing unusual Psych:  No- change in mood or affect. No depression or anxiety.  No memory loss.  OBJ- Physical Exam General- Alert, Oriented, Affect-appropriate, Distress- none acute, + overweight.  Skin- rash-none, lesions- none, excoriation- none Lymphadenopathy- none Head- atraumatic            Eyes- Gross vision intact, PERRLA, conjunctivae and secretions clear,               + periorbital edema            Ears- Hearing, canals-normal            Nose-  +turbinate edema, +mild septal dev, No- mucus, polyps, erosion, perforation.             Throat- Mallampati III-IV , mucosa-clear , drainage- none, tonsils- atrophic Neck- flexible , trachea midline, no stridor , thyroid nl, carotid no bruit Chest - symmetrical excursion , unlabored  Heart/CV- RRR , no murmur , no gallop  , no rub, nl s1 s2                           - JVD- none , edema- none, stasis changes- none, varices- none           Lung- clear to P&A, no-wheeze, cough- none , dullness-none, rub- none           Chest wall-  Abd-  Br/ Gen/ Rectal- Not done, not indicated Extrem- cyanosis- none, clubbing, none, atrophy- none, strength- nl Neuro- grossly intact to observation.

## 2015-11-02 NOTE — Assessment & Plan Note (Signed)
He keeps offering various discomforts as a reason he hasn't been using CPAP. We discussed alternatives. Plan-update sleep study. Then if appropriate refer to consider oral appliance.

## 2015-11-02 NOTE — Assessment & Plan Note (Signed)
Fairly good control. He did not ask for help during peak spring pollen season. He feels comfortable now.

## 2015-11-09 DIAGNOSIS — G4733 Obstructive sleep apnea (adult) (pediatric): Secondary | ICD-10-CM | POA: Diagnosis not present

## 2015-11-10 DIAGNOSIS — G4733 Obstructive sleep apnea (adult) (pediatric): Secondary | ICD-10-CM | POA: Diagnosis not present

## 2015-11-12 ENCOUNTER — Other Ambulatory Visit: Payer: Self-pay | Admitting: *Deleted

## 2015-11-12 DIAGNOSIS — G4733 Obstructive sleep apnea (adult) (pediatric): Secondary | ICD-10-CM

## 2015-11-24 ENCOUNTER — Ambulatory Visit (INDEPENDENT_AMBULATORY_CARE_PROVIDER_SITE_OTHER): Payer: Medicare HMO | Admitting: Internal Medicine

## 2015-11-24 ENCOUNTER — Encounter: Payer: Self-pay | Admitting: Internal Medicine

## 2015-11-24 VITALS — BP 140/84 | HR 62 | Ht 69.0 in | Wt 256.8 lb

## 2015-11-24 DIAGNOSIS — E669 Obesity, unspecified: Secondary | ICD-10-CM | POA: Diagnosis not present

## 2015-11-24 DIAGNOSIS — G4733 Obstructive sleep apnea (adult) (pediatric): Secondary | ICD-10-CM | POA: Diagnosis not present

## 2015-11-24 NOTE — Assessment & Plan Note (Signed)
Reminded of importance that he at least try to lose some weight.

## 2015-11-24 NOTE — Progress Notes (Signed)
10/30/14- 70 yoM never smoker followed by [facility] for allergic rhinitis, OSA, hx chronic sinusitis . Former patient at [facility]. Follows For: Pt states allergies have improved. Pt states flonase and azelastine nasal spray have helped.  CPAP Auto/ [facility] doing fine.  Rhinitis controlled much better after allergy season. Patient's meds help. We discussed availability of sublingual immunotherapy =future consideration.  05/04/2015-70 year old male never smoker followed for allergic rhinitis, OSA, history chronic sinusitis CPAP [facility]/ [facility] FOLLOWS FOR: DME is [facility]-pt states he placed "card" in mail yesterday to [place] for compliance report. Pt has not used CPAP much recently due to rotator cuff injury (right shoulder).  11/02/2015-70 year old male never smoker followed for Allergic rhinitis, OSA, history chronic sinusitis CPAP auto/[facility] FOLLOWS FOR: DME: [facility];Pt has not used CPAP in about 3 months; no data on card; pt states he is having issues with his spine then also has questions about his sinus area-has sounds in his ears. Has not used CPAP in months. Blames a variety of somatic complaints. Seeing the chiropractor for spine problems. We discussed alternatives to CPAP and he may be a candidate for an oral appliance. Sleep study would need to be updated. He only had about 2 weeks of allergic nasal congestion in mid pollen season but otherwise says this is been a good year for nasal symptoms.  11/24/2015-he'll never smoker followed for Allergic rhinitis, OSA, history chronic sinusitis CPAP auto/[facility]-noncompliant Unattended Home Sleep Test-11/09/2015-moderate obstructive sleep apnea-AHI 24.4/hour, desaturation to 82%, body weight 257 pounds. He had not been comfortable using CPAP. Now, with confirmation that he does qualify with ongoing OSA, we discussed evaluation for oral appliance as alternative. GI consultation concluded some of his discomforts were musculoskeletal, not gastrointestinal. Breathing  comfortably with no significant nasal or chest discomfort at this time.  ROS-see HPI Constitutional:   No-   weight loss, night sweats, fevers, chills, fatigue, lassitude. HEENT:   No-  headaches, difficulty swallowing, tooth/dental problems, sore throat,       No- sneezing, itching, ear ache, +nasal congestion, no-post nasal drip,  CV:  No-   chest pain, orthopnea, PND, swelling in lower extremities, anasarca, dizziness, palpitations Resp: No-   shortness of breath with exertion or at rest.              No-   productive cough,  No non-productive cough,  No- coughing up of blood.              No-   change in color of mucus.  No- wheezing.   Skin: No-   rash or lesions. GI:  No-   heartburn, indigestion, abdominal pain, nausea, vomiting,  GU:   MS:  No-   joint pain or swelling.   Neuro-     nothing unusual Psych:  No- change in mood or affect. No depression or anxiety.  No memory loss.  OBJ- Physical Exam General- Alert, Oriented, Affect-appropriate, Distress- none acute, + overweight.  Skin- rash-none, lesions- none, excoriation- none Lymphadenopathy- none Head- atraumatic            Eyes- Gross vision intact, PERRLA, conjunctivae and secretions clear,               + periorbital edema            Ears- Hearing, canals-normal            Nose-  +turbinate edema, +mild septal dev, No- mucus, polyps, erosion, perforation.             Throat-  Mallampati III-IV , mucosa-clear , drainage- none, tonsils- atrophic Neck- flexible , trachea midline, no stridor , thyroid nl, carotid no bruit Chest - symmetrical excursion , unlabored           Heart/CV- RRR , no murmur , no gallop  , no rub, nl s1 s2                           - JVD- none , edema- none, stasis changes- none, varices- none           Lung- clear to P&A, no-wheeze, cough- none , dullness-none, rub- none           Chest wall-  Abd-  Br/ Gen/ Rectal- Not done, not indicated Extrem- cyanosis- none, clubbing, none, atrophy- none,  strength- nl Neuro- grossly intact to observation.

## 2015-11-24 NOTE — Patient Instructions (Addendum)
Order- referral to orthodontist Dr Oneal Grout  Consider oral appliance for OSA  Please call as needed  You can discuss your digestive discomforts more with your primary care provider.

## 2015-11-24 NOTE — Assessment & Plan Note (Signed)
He couldn't get comfortable wearing CPAP. Weight loss remains recommended. Plan-refer consideration of oral appliance.

## 2015-12-07 ENCOUNTER — Other Ambulatory Visit: Payer: Self-pay | Admitting: Internal Medicine

## 2016-01-18 ENCOUNTER — Other Ambulatory Visit: Payer: Self-pay | Admitting: Internal Medicine

## 2016-01-25 ENCOUNTER — Other Ambulatory Visit: Payer: Self-pay | Admitting: Internal Medicine

## 2016-10-31 ENCOUNTER — Other Ambulatory Visit: Payer: Self-pay | Admitting: Internal Medicine

## 2017-01-06 IMAGING — CT CT ABD-PELV W/ CM
2 of 5 series · 17 of 46 positions shown, 19 images · IV contrast (Omni 300)
Comparison: 05/06/2008

CLINICAL DATA: Bilateral lower abdominal pain radiating to both
flanks. Pain since [REDACTED].

EXAM:
CT ABDOMEN AND PELVIS WITH CONTRAST
TECHNIQUE: Multidetector CT imaging of the abdomen and pelvis was performed
using the standard protocol following bolus administration of
intravenous contrast.
CONTRAST:  100mL OMNIPAQUE IOHEXOL 300 MG/ML  SOLN

[Series 2: a/p w/ 5mm · axial · 0.86mm/px · z∈[-531,-81]mm · 14 of 102 slices shown, 16 images]
[im 6/102  soft-tissue]
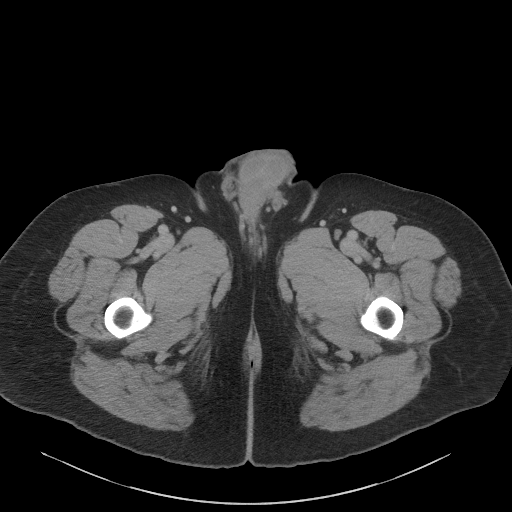
[im 6/102  bone]
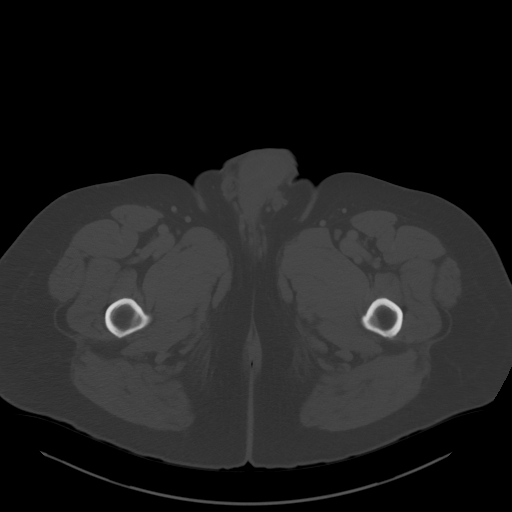
[im 12/102  soft-tissue]
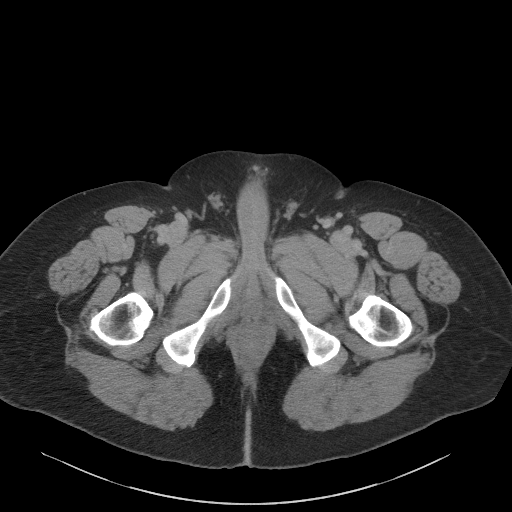
[im 23/102  soft-tissue]
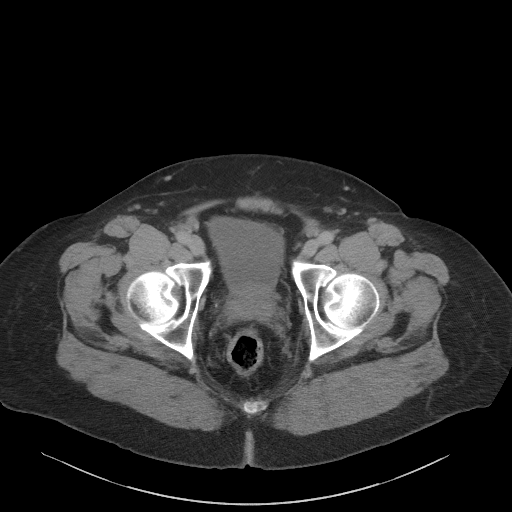
[im 29/102  soft-tissue]
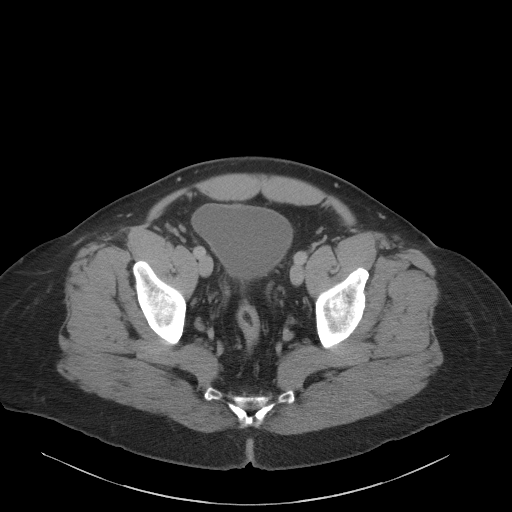
[im 34/102  soft-tissue]
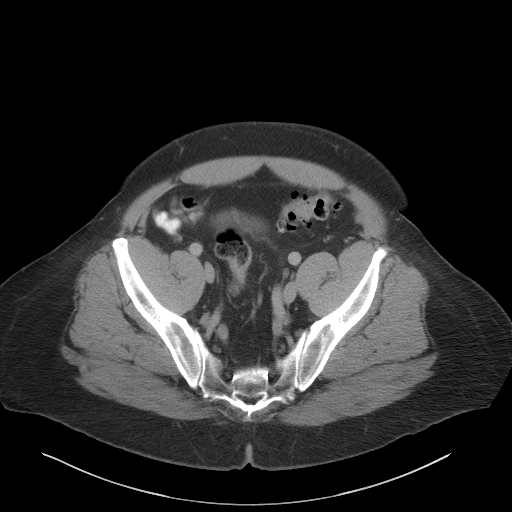
[im 40/102  soft-tissue]
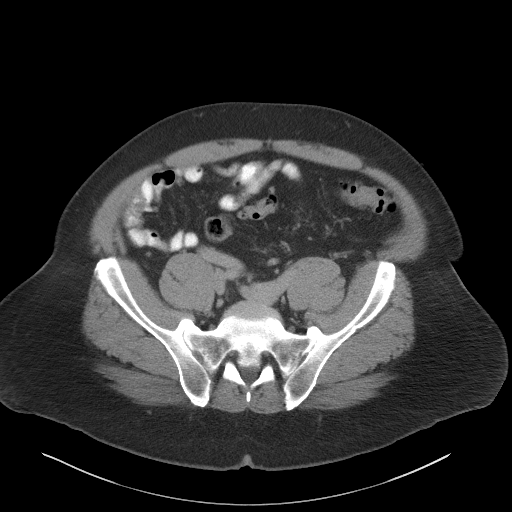
[im 45/102  soft-tissue]
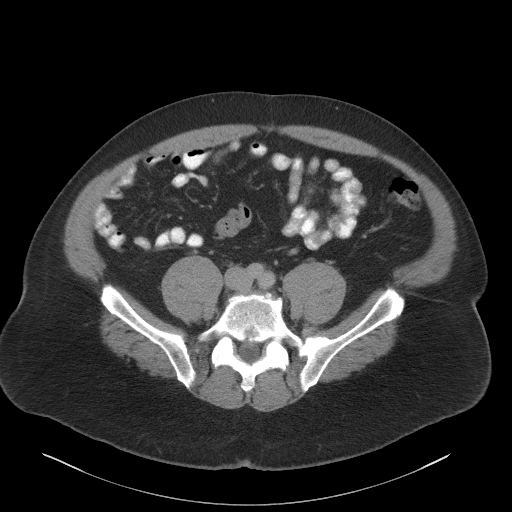
[im 57/102  soft-tissue]
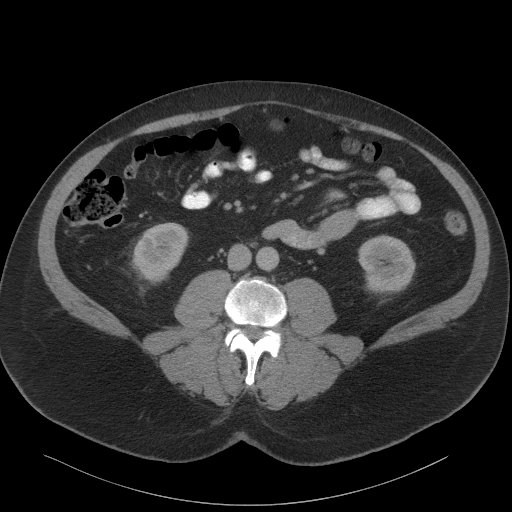
[im 62/102  soft-tissue]
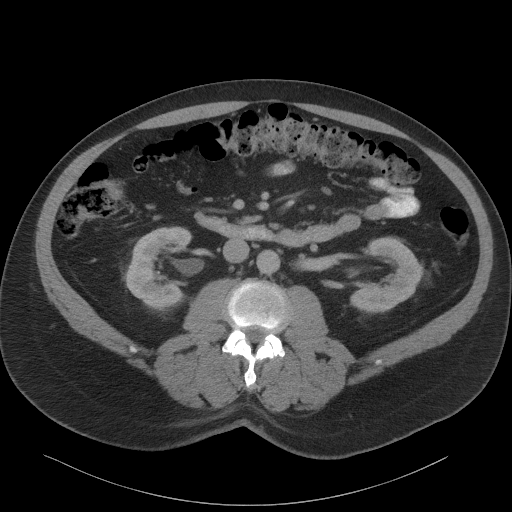
[im 62/102  bone]
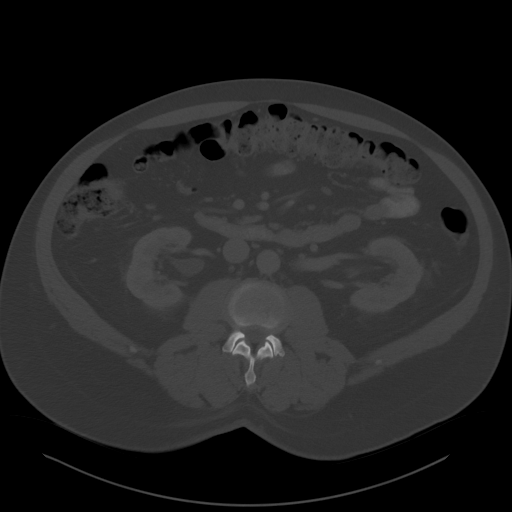
[im 68/102  soft-tissue]
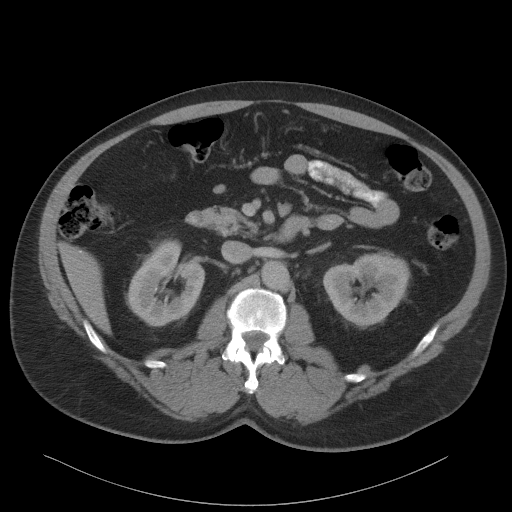
[im 73/102  soft-tissue]
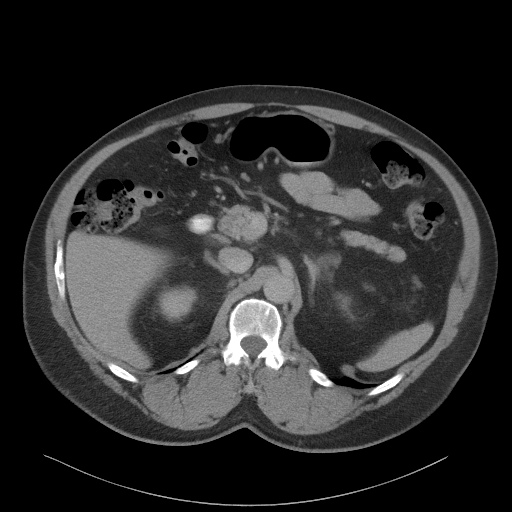
[im 79/102  soft-tissue]
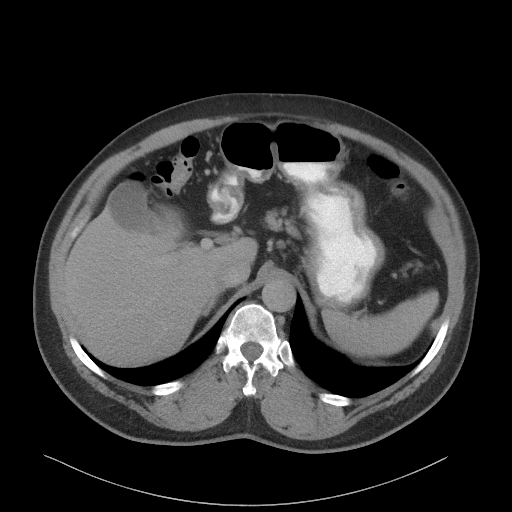
[im 90/102  soft-tissue]
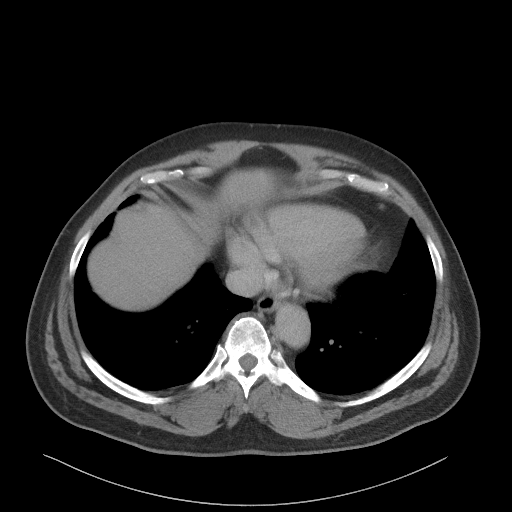
[im 96/102  soft-tissue]
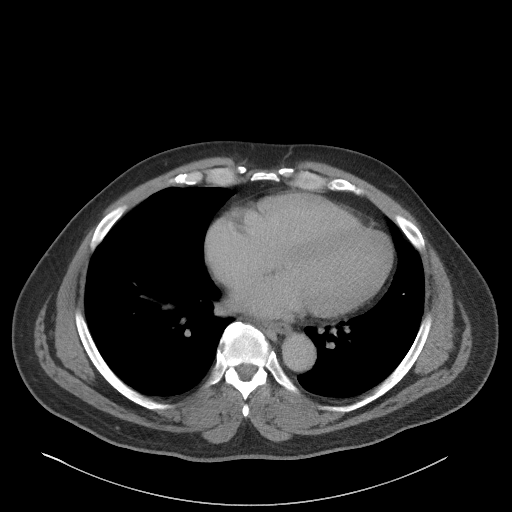

[Series 5: a/p w/ cor · coronal · 0.99mm/px · 3 of 152 slices shown]
[im 51/152  soft-tissue]
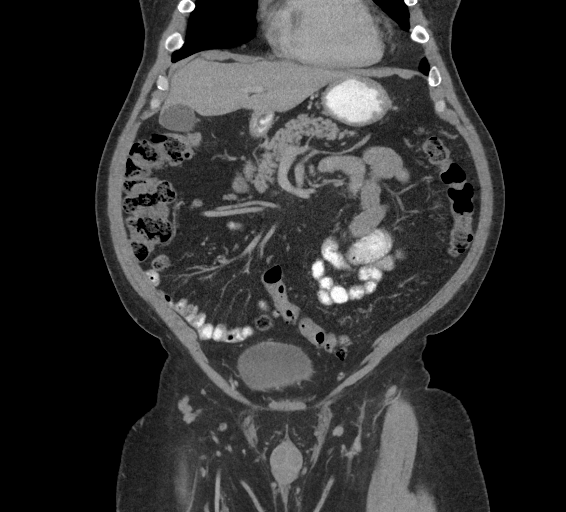
[im 68/152  soft-tissue]
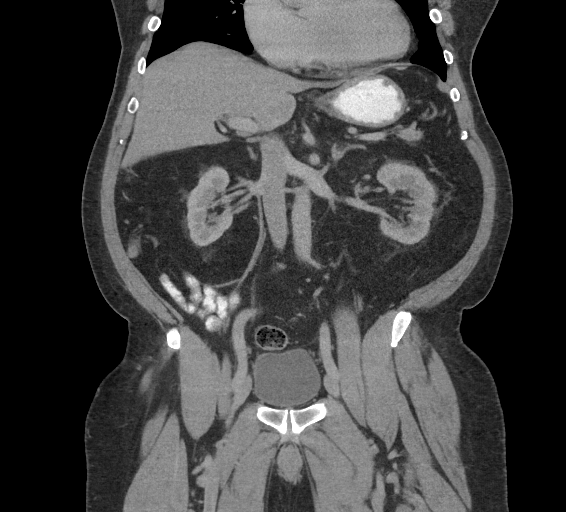
[im 84/152  soft-tissue]
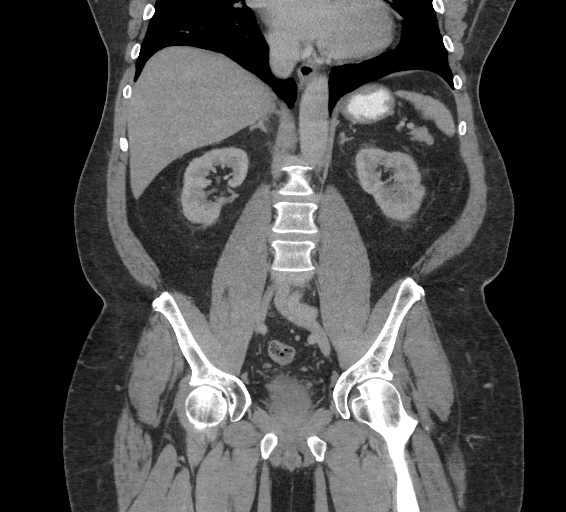

[17 of 46 positions shown; findings below may reference images not displayed]

FINDINGS: Small subpleural nodules in the lung bases are unchanged since
previous study suggesting benign etiology.

The liver, spleen, gallbladder, pancreas, adrenal glands, abdominal
aorta, inferior vena cava, and retroperitoneal lymph nodes are
unremarkable. There are punctate size stones measuring 2 mm or less
in both kidneys. No hydronephrosis in either kidney. No hydroureter.
No ureteral stones identified. Stomach, small bowel, and colon are
not abnormally distended. No free air or free fluid in the abdomen.

Pelvis: Appendix is normal. Diverticula in the sigmoid colon without
evidence of diverticulitis. Prostate gland is not enlarged. Bladder
wall is not thickened. No free or loculated pelvic fluid
collections. No pelvic mass or lymphadenopathy. Degenerative changes
in the spine. No destructive bone lesions.
IMPRESSION: No acute process demonstrated in the abdomen or pelvis.
Nonobstructing punctate sized renal stones bilaterally. Sub cm
nodules in the lung bases are unchanged since previous study
suggesting benign etiology. Diverticulosis of sigmoid colon without
evidence of diverticulitis.

## 2017-03-29 ENCOUNTER — Other Ambulatory Visit: Payer: Self-pay | Admitting: Internal Medicine

## 2017-08-03 ENCOUNTER — Encounter: Payer: Self-pay | Admitting: Internal Medicine

## 2017-08-03 ENCOUNTER — Ambulatory Visit: Payer: Medicare HMO | Admitting: Internal Medicine

## 2017-08-03 ENCOUNTER — Ambulatory Visit (INDEPENDENT_AMBULATORY_CARE_PROVIDER_SITE_OTHER)
Admission: RE | Admit: 2017-08-03 | Discharge: 2017-08-03 | Disposition: A | Discharge status: Home / Self Care | Payer: Medicare HMO | Source: Ambulatory Visit | Attending: Internal Medicine | Admitting: Internal Medicine

## 2017-08-03 VITALS — BP 130/80 | HR 84 | Ht 69.0 in | Wt 264.8 lb

## 2017-08-03 DIAGNOSIS — J4520 Mild intermittent asthma, uncomplicated: Secondary | ICD-10-CM | POA: Diagnosis not present

## 2017-08-03 DIAGNOSIS — G4733 Obstructive sleep apnea (adult) (pediatric): Secondary | ICD-10-CM | POA: Diagnosis not present

## 2017-08-03 DIAGNOSIS — R042 Hemoptysis: Secondary | ICD-10-CM

## 2017-08-03 DIAGNOSIS — J3089 Other allergic rhinitis: Secondary | ICD-10-CM

## 2017-08-03 DIAGNOSIS — J302 Other seasonal allergic rhinitis: Secondary | ICD-10-CM

## 2017-08-03 MED ORDER — METHYLPREDNISOLONE ACETATE 80 MG/ML IJ SUSP
80.0000 mg | Freq: Once | INTRAMUSCULAR | Status: AC
  Administered 2017-08-03: 80 mg via INTRAMUSCULAR

## 2017-08-03 MED ORDER — AZELASTINE HCL 0.1 % NA SOLN: NASAL | 12 refills | Status: DC

## 2017-08-03 MED ORDER — FLUTICASONE FUROATE-VILANTEROL 100-25 MCG/INH IN AEPB: 1.0000 | INHALATION_SPRAY | Freq: Every day | RESPIRATORY_TRACT | 0 refills | Status: DC

## 2017-08-03 MED ORDER — FLUTICASONE FUROATE-VILANTEROL 100-25 MCG/INH IN AEPB: 1.0000 | INHALATION_SPRAY | Freq: Every day | RESPIRATORY_TRACT | Status: DC

## 2017-08-03 MED ORDER — FLUTICASONE PROPIONATE 50 MCG/ACT NA SUSP: NASAL | 12 refills | Status: DC

## 2017-08-03 NOTE — Assessment & Plan Note (Signed)
He is noticing mild wheezing if he sits quietly since his rhinitis began.  With available sample, we will let him try Breo to see if symptom response.

## 2017-08-03 NOTE — Assessment & Plan Note (Signed)
He says he is still working with Dr. Ron Parker on fitted oral appliance treatment for OSA.

## 2017-08-03 NOTE — Patient Instructions (Addendum)
Order- Depo 69    Dx seasonal and perennial allergic rhinitis  Order - CXR    Dx hemoptysis  Sample Breo 100    Inhale 1 puff, then rinse mouth, once daily  Suggest you look for loratadine as an antihistamine for allergy symptoms (Brand name is Claritin)  Scripts sent for Flonase and Astelin nasal sprays  Please call if we can help

## 2017-08-03 NOTE — Assessment & Plan Note (Signed)
Exacerbation with spring tree pollens.  He will [place] full-time allergist if needed. Plan-suggested Depo-Medrol, refill prescription for Flonase and Astelin nasal sprays, loratadine.

## 2017-08-03 NOTE — Progress Notes (Signed)
HPI male never smoker followed for Allergic rhinitis, OSA, history chronic sinusitis, HBP, RBBB/LAHB, Hx RF heart disease, Pulmonary Valve Stenosis NPSG 2000:  AHI 76/hr  Unattended Home Sleep Test-11/09/2015-moderate obstructive sleep apnea-AHI 24.4/hour, desaturation to 82%, body weight 257 pounds  ------------------------------------------------------------------------  11/24/2015-he'll never smoker followed for Allergic rhinitis, OSA, history chronic sinusitis CPAP auto/[facility]-noncompliant Unattended Home Sleep Test-11/09/2015-moderate obstructive sleep apnea-AHI 24.4/hour, desaturation to 82%, body weight 257 pounds. He had not been comfortable using CPAP. Now, with confirmation that he does qualify with ongoing OSA, we discussed evaluation for oral appliance as alternative. GI consultation concluded some of his discomforts were musculoskeletal, not gastrointestinal. Breathing comfortably with no significant nasal or chest discomfort at this time.  08/03/17-  71 yoM never smoker followed for Allergic rhinitis, OSA, history chronic sinusitis, HBP, RBBB/LAHB, Hx RF heart disease, Pulmonary Valve Stenosis CPAP auto/[facility]-noncompliant                Wife is here ----OSA and trouble with cough/sneezing. Pt states he has been working with Dr. Ron Parker on oral appliance. Pt noted seeing blood tinged sputum yesterday when coughing. Has not seen blood with sneezing.  For 1 week has noted increased rhinorrhea and nasal congestion with cough, typical of his seasonal spring allergy flares.  Asked refill nasal sprays and recommendation on antihistamine.  Notices wheezing if he sits quietly.  Slight hemoptysis recently with hard cough.  Denies fever, purulent material, adenopathy.  ROS-see HPI + = positive Constitutional:   No-   weight loss, night sweats, fevers, chills, fatigue, lassitude. HEENT:   No-  headaches, difficulty swallowing, tooth/dental problems, sore throat,       No- sneezing, itching, ear  ache, +nasal congestion, no-post nasal drip,  CV:  No-   chest pain, orthopnea, PND, swelling in lower extremities, anasarca, dizziness, palpitations Resp: No-   shortness of breath with exertion or at rest.           +  productive cough,  No non-productive cough,  + coughing up of blood.              No-   change in color of mucus.  No- wheezing.   Skin: No-   rash or lesions. GI:  No-   heartburn, indigestion, abdominal pain, nausea, vomiting,  GU:   MS:  No-   joint pain or swelling.   Neuro-     nothing unusual Psych:  No- change in mood or affect. No depression or anxiety.  No memory loss.  OBJ- Physical Exam General- Alert, Oriented, Affect-appropriate, Distress- none acute, + overweight.  Skin- rash-none, lesions- none, excoriation- none Lymphadenopathy- none Head- atraumatic            Eyes- Gross vision intact, PERRLA, conjunctivae and secretions clear,               + periorbital edema            Ears- Hearing, canals-normal            Nose-  +turbinate edema, +mild septal dev, No- mucus, polyps, erosion, perforation.             Throat- Mallampati III-IV , mucosa-clear , drainage- none, tonsils- atrophic Neck- flexible , trachea midline, no stridor , thyroid nl, carotid no bruit Chest - symmetrical excursion , unlabored           Heart/CV- RRR , murmur+ syst , no gallop  , no rub, nl s1 s2                           -  JVD- none , edema- none, stasis changes- none, varices- none           Lung-+ coarse/unlabored, no-wheeze, cough- none , dullness-none, rub- none           Chest wall-  Abd-  Br/ Gen/ Rectal- Not done, not indicated Extrem- cyanosis- none, clubbing, none, atrophy- none, strength- nl Neuro- grossly intact to observation.

## 2017-12-14 ENCOUNTER — Ambulatory Visit (INDEPENDENT_AMBULATORY_CARE_PROVIDER_SITE_OTHER): Payer: Medicare HMO

## 2017-12-14 ENCOUNTER — Ambulatory Visit (HOSPITAL_COMMUNITY)
Admission: EM | Admit: 2017-12-14 | Discharge: 2017-12-14 | Disposition: A | Discharge status: Home / Self Care | Payer: Medicare HMO | Attending: Family Medicine | Admitting: Family Medicine

## 2017-12-14 ENCOUNTER — Other Ambulatory Visit: Payer: Self-pay

## 2017-12-14 ENCOUNTER — Encounter (HOSPITAL_COMMUNITY): Payer: Self-pay

## 2017-12-14 ENCOUNTER — Ambulatory Visit (HOSPITAL_COMMUNITY): Payer: Medicare HMO

## 2017-12-14 DIAGNOSIS — R0789 Other chest pain: Secondary | ICD-10-CM

## 2017-12-14 DIAGNOSIS — M25561 Pain in right knee: Secondary | ICD-10-CM

## 2017-12-14 DIAGNOSIS — M1711 Unilateral primary osteoarthritis, right knee: Secondary | ICD-10-CM | POA: Diagnosis not present

## 2017-12-14 DIAGNOSIS — J019 Acute sinusitis, unspecified: Secondary | ICD-10-CM | POA: Diagnosis not present

## 2017-12-14 MED ORDER — NAPROXEN 375 MG PO TABS: 375.0000 mg | ORAL_TABLET | Freq: Two times a day (BID) | ORAL | 0 refills | Status: DC

## 2017-12-14 MED ORDER — BENZONATATE 100 MG PO CAPS: 100.0000 mg | ORAL_CAPSULE | Freq: Three times a day (TID) | ORAL | 0 refills | Status: DC

## 2017-12-14 MED ORDER — OLOPATADINE HCL 0.1 % OP SOLN: 1.0000 [drp] | Freq: Two times a day (BID) | OPHTHALMIC | 12 refills | Status: DC

## 2017-12-14 MED ORDER — CETIRIZINE HCL 10 MG PO CAPS: 10.0000 mg | ORAL_CAPSULE | Freq: Every day | ORAL | 0 refills | Status: DC

## 2017-12-14 MED ORDER — CEFDINIR 300 MG PO CAPS: 300.0000 mg | ORAL_CAPSULE | Freq: Two times a day (BID) | ORAL | 0 refills | Status: AC

## 2017-12-14 NOTE — Discharge Instructions (Addendum)
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  Ice and elevate your knee, please wear Ace wrap for compression to provide extra support. -Follow-up with orthopedics if symptoms continuing to worsen or not improving over the next week

## 2017-12-14 NOTE — ED Provider Notes (Signed)
[place]    CSN: [id number] Arrival date & time: 12/14/17  0944     History   Chief Complaint Chief Complaint  Patient presents with  . Nasal Congestion  . Eye Drainage  . Knee Pain    HPI Eugene [name] is a 72 y.o. male history of asthma, hypertension, osteoarthritis, OSA presenting today for evaluation of 3 weeks of congestion as well as right eye drainage.  Is also concerned about right knee pain.  Patient states that over the past 3 weeks he has had congestion, sensation of feeling congestion in his throat, occasional productive coughing.  At time of visit he is having hot and cold chills.  Denies any known fevers.  He has been taking chlorpheniramine/phenylephrine without relief.  Is also noted some blurry vision and sensation of feeling weak.  He does note a dull sensation in the center of his chest.  He also notes for the past week he has had some mucousy drainage out of his right eye, also noting clear drainage bilaterally.  Notes that last night he went to stand up and felt a pop in his knee, felt immediate pain and had to sit down.  Since he has had pain with weightbearing and movement of his knee.  Has been using icy hot on his knee.  HPI  Past Medical History:  Diagnosis Date  . Allergic rhinitis due to pollen   . Anxiety   . DDD (degenerative disc disease), cervical    Severe w/ osteophytes on xray 3/16  . ED (erectile dysfunction)   . Hypertension   . OBESITY   . Osteoarthritis   . Pulmonary stenosis, valvar    TEE 07/2011: stable, LVEF 55%, small PFO  . Refusal of blood transfusions as patient is Jehovah's Witness   . Rheumatic fever    residual pulm stenosis, follows with [facility]ji every 44mo  . RHINITIS, CHRONIC   . SLEEP APNEA, OBSTRUCTIVE dx 2000   NPSG 2000:  AHI 76/hr CPAP titrated to 11cm 2002     Patient Active Problem List   Diagnosis Date Noted  . Asthma, mild intermittent 08/03/2017  . Degenerative cervical disc 08/05/2014  . Routine  general medical examination at a health care facility 07/22/2014  . RBBB with left anterior fascicular block   . Pulmonary stenosis, valvar   . ED (erectile dysfunction) 03/22/2011  . Osteoarthritis 03/01/2011  . Seasonal and perennial allergic rhinitis 03/01/2011  . Obesity 04/21/2008  . Obstructive sleep apnea 04/21/2008  . Essential hypertension 04/21/2008  . CARDIAC MURMUR 04/21/2008    Past Surgical History:  Procedure Laterality Date  . TEE WITHOUT CARDIOVERSION  08/02/2011   Procedure: TRANSESOPHAGEAL ECHOCARDIOGRAM (TEE);  Surgeon: Laverda Page, MD;  Location: [facility];  Service: Cardiovascular;  Laterality: N/A;       Home Medications    Prior to Admission medications   Medication Sig Start Date End Date Taking? Authorizing Provider  allopurinol (ZYLOPRIM) 100 MG tablet Take 1 tablet by mouth daily. 05/17/17   [provider]  amLODipine (NORVASC) 5 MG tablet Take 1 tablet (5 mg total) by mouth daily. Must transition care for future refills 07/15/15   Rowe Clack, MD  azelastine (ASTELIN) 0.1 % nasal spray PLACE 1 TO 2 SPRAYS IN EACH NOSTRIL TWICE DAILY AS NEEDED 08/03/17   Deneise Lever, MD  benzonatate (TESSALON) 100 MG capsule Take 1 capsule (100 mg total) by mouth every 8 (eight) hours. 12/14/17   Wieters, Elesa Hacker, PA-C  carvedilol (COREG) 25 MG tablet Take 1 tablet (25 mg total) by mouth 2 (two) times daily with a meal. 01/20/15   Rowe Clack, MD  cefdinir (OMNICEF) 300 MG capsule Take 1 capsule (300 mg total) by mouth 2 (two) times daily for 7 days. 12/14/17 12/21/17  Wieters, Hallie C, PA-C  Cetirizine HCl 10 MG CAPS Take 1 capsule (10 mg total) by mouth daily for 10 days. 12/14/17 12/24/17  Wieters, Hallie C, PA-C  Cholecalciferol (VITAMIN D) 2000 UNITS CAPS Take 2,000 Units by mouth daily.    [provider]  clonazePAM (KLONOPIN) 1 MG tablet Take 1 tablet (1 mg total) by mouth at bedtime. 07/22/14   Hoyt Koch, MD    fluticasone (FLONASE) 50 MCG/ACT nasal spray INSTILL 1 TO 2 SPRAYS INTO EACH NOSTRIL ONCE DAILY 08/03/17   Young, Tarri Fuller D, MD  fluticasone furoate-vilanterol (BREO ELLIPTA) 100-25 MCG/INH AEPB Inhale 1 puff into the lungs daily. 08/03/17   Baird Lyons D, MD  gabapentin (NEURONTIN) 100 MG capsule Take 2 capsules (200 mg total) by mouth at bedtime. 12/09/14   Lyndal Pulley, DO  ibuprofen (ADVIL,MOTRIN) 800 MG tablet Take 1 tablet (800 mg total) by mouth 3 (three) times daily. 01/20/15   Gloriann Loan, PA-C  loratadine (CLARITIN) 10 MG tablet Take 10 mg by mouth daily as needed for allergies.    [provider]  losartan-hydrochlorothiazide (HYZAAR) 50-12.5 MG per tablet Take 1 tablet by mouth 2 (two) times daily. 01/20/15   Rowe Clack, MD  Misc Natural Products (OSTEO BI-FLEX ADV DOUBLE ST PO) Take 2 capsules by mouth daily.    [provider]  nabumetone (RELAFEN) 750 MG tablet Take 1 tablet (750 mg total) by mouth 2 (two) times daily. Must transition care to new provider for future refills Patient taking differently: Take 750 mg by mouth daily. Must transition care to new provider for future refills 06/30/15   Rowe Clack, MD  naproxen (NAPROSYN) 375 MG tablet Take 1 tablet (375 mg total) by mouth 2 (two) times daily. 12/14/17   Wieters, Hallie C, PA-C  olopatadine (PATANOL) 0.1 % ophthalmic solution Place 1 drop into both eyes 2 (two) times daily. 12/14/17   Wieters, Hallie C, PA-C  Omega-3 Fatty Acids (FISH OIL) 1000 MG CAPS Take 1,000 mg by mouth 2 (two) times daily.    [provider]  polyethylene glycol powder (GLYCOLAX/MIRALAX) powder Take 17 g by mouth 2 (two) times daily. 04/28/15   Junius Creamer, NP  Turmeric 500 MG CAPS Take 500 mg by mouth daily.    [provider]  VIAGRA 100 MG tablet Take 1 tablet (100 mg total) by mouth as needed for erectile dysfunction. 05/13/14   Hendricks Limes, MD    Family History Family History  Problem  Relation Age of Onset  . Heart disease Mother   . Heart disease Father   . Hypertension Other     Social History Social History   Tobacco Use  . Smoking status: Never Smoker  . Smokeless tobacco: Never Used  Substance Use Topics  . Alcohol use: Yes    Alcohol/week: 0.0 oz  . Drug use: No     Allergies   Other and Penicillins   Review of Systems Review of Systems  Constitutional: Positive for fatigue. Negative for activity change, appetite change, chills and fever.  HENT: Positive for congestion, rhinorrhea and sinus pressure. Negative for ear pain, sore throat and trouble swallowing.   Eyes: Positive  for discharge and visual disturbance. Negative for redness.  Respiratory: Positive for cough and chest tightness. Negative for shortness of breath.   Cardiovascular: Negative for chest pain.  Gastrointestinal: Negative for abdominal pain, diarrhea, nausea and vomiting.  Musculoskeletal: Negative for myalgias.  Skin: Negative for rash.  Neurological: Positive for light-headedness. Negative for dizziness and headaches.     Physical Exam Triage Vital Signs ED Triage Vitals  Enc Vitals Group     BP 12/14/17 1011 124/74     Pulse Rate 12/14/17 1010 97     Resp 12/14/17 1010 18     Temp 12/14/17 1010 98.9 F (37.2 C)     Temp Source 12/14/17 1010 Oral     SpO2 12/14/17 1011 98 %     Weight 12/14/17 1011 265 lb (120.2 kg)     Height --      Head Circumference --      Peak Flow --      Pain Score 12/14/17 1011 7     Pain Loc --      Pain Edu? --      Excl. in [place]? --    No data found.  Updated Vital Signs BP 124/74 (BP Location: Right Arm)   Pulse 97   Temp 98.9 F (37.2 C) (Oral)   Resp 18   Wt 265 lb (120.2 kg)   SpO2 98%   BMI 39.13 kg/m   Visual Acuity Right Eye Distance: 20/25 Left Eye Distance: 20/25 Bilateral Distance: (20/25)  Right Eye Near:   Left Eye Near:    Bilateral Near:     Physical Exam  Constitutional: He appears well-developed and  well-nourished.  Patient appears diaphoretic, and fatigued  HENT:  Head: Normocephalic and atraumatic.  Bilateral ears without tenderness to palpation of external auricle, tragus and mastoid, EAC's without erythema or swelling, TM's with good bony landmarks and cone of light. Non erythematous.  Nasal mucosa erythematous  Oral mucosa pink and moist, no tonsillar enlargement or exudate. Posterior pharynx patent and nonerythematous, no uvula deviation or swelling. Normal phonation.  Eyes: Conjunctivae are normal.  Neck: Neck supple.  Cardiovascular: Normal rate and regular rhythm.  No murmur heard. Pulmonary/Chest: Effort normal and breath sounds normal. No respiratory distress.  Breathing comfortably at rest, CTABL, no wheezing, rales or other adventitious sounds auscultated  Abdominal: Soft. There is no tenderness.  Musculoskeletal: He exhibits no edema.  Neurological: He is alert.  Skin: Skin is warm and dry.  Psychiatric: He has a normal mood and affect.  Thought process seems slightly slowed  Nursing note and vitals reviewed.    UC Treatments / Results  Labs (all labs ordered are listed, but only abnormal results are displayed) Labs Reviewed - No data to display  EKG None  Radiology Dg Chest 2 View  Result Date: 12/14/2017 CLINICAL DATA:  Cough EXAM: CHEST - 2 VIEW COMPARISON:  08/03/2017 FINDINGS: Heart size mildly enlarged. Pulmonary artery enlargement unchanged from the prior study. Negative for heart failure or pneumonia. No effusion. Lungs are clear. IMPRESSION: Pulmonary artery enlargement consistent with pulmonary artery hypertension. No acute abnormality. Electronically Signed   By: Franchot Gallo M.D.   On: 12/14/2017 11:30   Dg Knee Complete 4 Views Right  Result Date: 12/14/2017 CLINICAL DATA:  Knee pain EXAM: RIGHT KNEE - COMPLETE 4+ VIEW COMPARISON:  None. FINDINGS: Moderate patellofemoral degenerative change with joint space narrowing and spurring. Mild joint  space narrowing medially. Lateral joint space normal. Negative for fracture or effusion.  IMPRESSION: Osteoarthritis primarily in the patellofemoral joint. No acute abnormality. Electronically Signed   By: Franchot Gallo M.D.   On: 12/14/2017 11:31    Procedures Procedures (including critical care time)  Medications Ordered in UC Medications - No data to display  Initial Impression / Assessment and Plan / UC Course  I have reviewed the triage vital signs and the nursing notes.  Pertinent labs & imaging results that were available during my care of the patient were reviewed by me and considered in my medical decision making (see chart for details).     By the end of the visit, patient was no longer diaphoretic and fatigued appearing, he is speaking without as much shortness of breath and appears much more comfortable.  EKG continuing to show the right bundle branch block and conduction delays similar to previous EKG, no new acute changes or signs of ischemia or infarction.  Chest x-ray showing no effusion or pneumonia, does show enlargement of pulmonary artery concerning for pulmonary artery hypertension.  At this time we will treat him for sinusitis with Omnicef twice daily for 1 week, discussed using daily allergy pill temporarily to help with congestion and drainage.  Tessalon for cough.  Provided olopatadine eyedrops to help with burning discomfort in eyes.  Visual acuity intact blurriness likely related to frequent tearing.,    Knee pain likely Arthritis flare, will recommend anti-inflammatories, ice, elevation and compression.  Follow-up with orthopedics persisting.  Provided Naprosyn to use along with Tylenol.    Discussed strict return precautions. Patient verbalized understanding and is agreeable with plan.  Final Clinical Impressions(s) / UC Diagnoses   Final diagnoses:  Acute sinusitis with symptoms > 10 days     Discharge Instructions     Please begin Omnicef twice daily for  the next week Daily Zyrtec for 1 to 2 weeks to help with congestion and drainage, please take in the morning Please use Tessalon as needed every 8 hours for cough Please use olopatadine eyedrops twice daily to help with itching, burning and drainage -Please return here go to emergency room if developing worsening shortness of breath, difficulty breathing, chest discomfort, fevers, dizziness, lightheadedness, vision changes  Knees x-ray showed osteoarthritis behind your kneecap, please take Naprosyn twice daily with food on your stomach, may add an Tylenol 500 to 1000 mg with this.  Ice and elevate your knee, please wear Ace wrap for compression to provide extra support. -Follow-up with orthopedics if symptoms continuing to worsen or not improving over the next week    ED Prescriptions    Medication Sig Dispense Auth. Provider   cefdinir (OMNICEF) 300 MG capsule Take 1 capsule (300 mg total) by mouth 2 (two) times daily for 7 days. 14 capsule Wieters, Hallie C, PA-C   Cetirizine HCl 10 MG CAPS Take 1 capsule (10 mg total) by mouth daily for 10 days. 10 capsule Wieters, Hallie C, PA-C   olopatadine (PATANOL) 0.1 % ophthalmic solution Place 1 drop into both eyes 2 (two) times daily. 5 mL Wieters, Hallie C, PA-C   naproxen (NAPROSYN) 375 MG tablet Take 1 tablet (375 mg total) by mouth 2 (two) times daily. 20 tablet Wieters, Hallie C, PA-C   benzonatate (TESSALON) 100 MG capsule Take 1 capsule (100 mg total) by mouth every 8 (eight) hours. 21 capsule Wieters, Hallie C, PA-C     Controlled Substance Prescriptions [place] Controlled Substance Registry consulted? Not Applicable   Janith Lima, [place] 12/14/17 1154

## 2017-12-14 NOTE — ED Notes (Signed)
Patient transported to X-ray 

## 2017-12-14 NOTE — ED Triage Notes (Signed)
Congestion 3 weeks, right eye drainage 1 week, and right knee pain.1 day. Pt states his right knee popped yesterday he's not sure why that happened.

## 2018-06-14 ENCOUNTER — Other Ambulatory Visit: Payer: Self-pay | Admitting: Family Medicine

## 2018-06-14 DIAGNOSIS — R011 Cardiac murmur, unspecified: Secondary | ICD-10-CM

## 2018-06-19 ENCOUNTER — Ambulatory Visit
Admission: RE | Admit: 2018-06-19 | Discharge: 2018-06-19 | Disposition: A | Discharge status: Home / Self Care | Payer: Medicare HMO | Source: Ambulatory Visit | Attending: Family Medicine | Admitting: Family Medicine

## 2018-06-19 DIAGNOSIS — R011 Cardiac murmur, unspecified: Secondary | ICD-10-CM

## 2018-06-21 ENCOUNTER — Other Ambulatory Visit: Payer: Self-pay | Admitting: Family Medicine

## 2018-06-21 DIAGNOSIS — E041 Nontoxic single thyroid nodule: Secondary | ICD-10-CM

## 2018-06-25 ENCOUNTER — Ambulatory Visit
Admission: RE | Admit: 2018-06-25 | Discharge: 2018-06-25 | Disposition: A | Discharge status: Home / Self Care | Payer: Medicare HMO | Source: Ambulatory Visit | Attending: Family Medicine | Admitting: Family Medicine

## 2018-06-25 DIAGNOSIS — E041 Nontoxic single thyroid nodule: Secondary | ICD-10-CM

## 2018-07-24 ENCOUNTER — Encounter: Payer: Self-pay | Admitting: Gastroenterology

## 2018-07-24 DIAGNOSIS — R55 Syncope and collapse: Secondary | ICD-10-CM | POA: Insufficient documentation

## 2018-07-30 ENCOUNTER — Telehealth: Payer: Self-pay

## 2018-07-30 NOTE — Telephone Encounter (Signed)
Needs OV to discuss. Hard to make diagnosis

## 2018-07-30 NOTE — Telephone Encounter (Signed)
Pt called and stated that he passed out last week and wanted to know if this could be from his heart?

## 2018-07-31 ENCOUNTER — Telehealth: Payer: Self-pay

## 2018-07-31 NOTE — Telephone Encounter (Signed)
Meredith from [facility] [employer] office called stating that pt had a episode about 1 week ago where he passed out for about 10 min. He just called pcp office today about being seen. No symptoms or episodes today. Pt was wanting to be seen. He does have a murmur so they are wanting to know should he be sen here instead. Please review and advise.//ah

## 2018-07-31 NOTE — Telephone Encounter (Signed)
Has appt scheduled for 03/19

## 2018-08-01 NOTE — Progress Notes (Signed)
Subjective:  Primary Physician:  [name], MD  Patient ID: Eugene [name], male    DOB: Jul 19, 1945, 73 y.o.   MRN: [id number]  Chief Complaint  Patient presents with   Loss of Consciousness    Syncope   Heart Murmur   Follow-up    last EKG 03/01/18    HPI: Micai Apolinar  is a 73 y.o. male  with pulmonary valve stenosis and chronic dyspnea, hypertension, morbid obesity, obstructive sleep apnea and uses oral prosthesis posterior office to be evaluated for syncope that he had on 07/26/2018 while he was sitting on a chair, suddenly had syncope and trauma to his head.  Since then he felt well for a day or 2, then started having chest pain.  A CT scan of the head has been ordered.  He had no premonitory symptoms, onset was sudden.  He has not had any recurrence.  Denies chest pain and dyspnea has remained stable.  No PND or orthopnea.  One day prior to the syncope, he also had right knee steroid injection done, when he woke up after the syncope, he had traumatized his right knee due to fall as well.  He does have history of rheumatic fever as a child. He has had previously TEE in 2016 that showed mild mitral regurgitation and mild pulmonary valve stenosis, the morphology did not appear to be of rheumatic heart disease.   Past Medical History:  Diagnosis Date   Allergic rhinitis due to pollen    Anxiety    DDD (degenerative disc disease), cervical    Severe w/ osteophytes on xray 3/16   ED (erectile dysfunction)    Hypertension    OBESITY    Osteoarthritis    Pulmonary stenosis, valvar    TEE 07/2011: stable, LVEF 55%, small PFO   Refusal of blood transfusions as patient is Jehovah's Witness    Rheumatic fever    residual pulm stenosis, follows with Nayleen Janosik every 81mo   RHINITIS, CHRONIC    SLEEP APNEA, OBSTRUCTIVE dx 2000   NPSG 2000:  AHI 76/hr CPAP titrated to 11cm 2002     Past Surgical History:  Procedure Laterality Date   TEE WITHOUT  CARDIOVERSION  08/02/2011   Procedure: TRANSESOPHAGEAL ECHOCARDIOGRAM (TEE);  Surgeon: Laverda Page, MD;  Location: [facility] ENDOSCOPY;  Service: Cardiovascular;  Laterality: N/A;    Social History   Socioeconomic History   Marital status: Married    Spouse name: Not on file   Number of children: 3   Years of education: Not on file   Highest education level: Not on file  Occupational History   Occupation: retired.      Comment: prev worked as Therapist, music for [employer] resource strain: Not on file   Food insecurity:    Worry: Not on file    Inability: Not on file   Transportation needs:    Medical: Not on file    Non-medical: Not on file  Tobacco Use   Smoking status: Never Smoker   Smokeless tobacco: Never Used  Substance and Sexual Activity   Alcohol use: Yes    Alcohol/week: 0.0 standard drinks   Drug use: No   Sexual activity: Yes  Lifestyle   Physical activity:    Days per week: Not on file    Minutes per session: Not on file   Stress: Not on file  Relationships   Social connections:    Talks on phone: Not on  file    Gets together: Not on file    Attends religious service: Not on file    Active member of club or organization: Not on file    Attends meetings of clubs or organizations: Not on file    Relationship status: Not on file   Intimate partner violence:    Fear of current or ex partner: Not on file    Emotionally abused: Not on file    Physically abused: Not on file    Forced sexual activity: Not on file  Other Topics Concern   Not on file  Social History Narrative   Not on file    Current Outpatient Medications on File Prior to Visit  Medication Sig Dispense Refill   allopurinol (ZYLOPRIM) 100 MG tablet Take 1 tablet by mouth daily.     amLODipine (NORVASC) 5 MG tablet Take 1 tablet (5 mg total) by mouth daily. Must transition care for future refills (Patient taking differently: Take 10  mg by mouth daily. Must transition care for future refills) 90 tablet 1   azelastine (ASTELIN) 0.1 % nasal spray PLACE 1 TO 2 SPRAYS IN EACH NOSTRIL TWICE DAILY AS NEEDED 30 mL 12   carvedilol (COREG) 25 MG tablet Take 1 tablet (25 mg total) by mouth 2 (two) times daily with a meal. 180 tablet 1   Chlorpheniramine-Acetaminophen (CORICIDIN HBP COLD/FLU PO) Take 1 tablet by mouth as needed.     fluticasone (FLONASE) 50 MCG/ACT nasal spray INSTILL 1 TO 2 SPRAYS INTO EACH NOSTRIL ONCE DAILY 48 g 12   losartan-hydrochlorothiazide (HYZAAR) 50-12.5 MG per tablet Take 1 tablet by mouth 2 (two) times daily. 180 tablet 1   nabumetone (RELAFEN) 750 MG tablet Take 1 tablet (750 mg total) by mouth 2 (two) times daily. Must transition care to new provider for future refills (Patient taking differently: Take 750 mg by mouth daily. Must transition care to new provider for future refills) 180 tablet 0   Turmeric 500 MG CAPS Take 500 mg by mouth daily.     benzonatate (TESSALON) 100 MG capsule Take 1 capsule (100 mg total) by mouth every 8 (eight) hours. (Patient not taking: Reported on 08/02/2018) 21 capsule 0   Cetirizine HCl 10 MG CAPS Take 1 capsule (10 mg total) by mouth daily for 10 days. 10 capsule 0   Cholecalciferol (VITAMIN D) 2000 UNITS CAPS Take 2,000 Units by mouth daily.     clonazePAM (KLONOPIN) 1 MG tablet Take 1 tablet (1 mg total) by mouth at bedtime. (Patient not taking: Reported on 08/02/2018) 90 tablet 0   fluticasone furoate-vilanterol (BREO ELLIPTA) 100-25 MCG/INH AEPB Inhale 1 puff into the lungs daily. (Patient not taking: Reported on 08/02/2018) 1 each 0   gabapentin (NEURONTIN) 100 MG capsule Take 2 capsules (200 mg total) by mouth at bedtime. (Patient not taking: Reported on 08/02/2018) 180 capsule 1   ibuprofen (ADVIL,MOTRIN) 800 MG tablet Take 1 tablet (800 mg total) by mouth 3 (three) times daily. (Patient not taking: Reported on 08/02/2018) 21 tablet 0   loratadine (CLARITIN)  10 MG tablet Take 10 mg by mouth daily as needed for allergies.     Misc Natural Products (OSTEO BI-FLEX ADV DOUBLE ST PO) Take 2 capsules by mouth daily.     naproxen (NAPROSYN) 375 MG tablet Take 1 tablet (375 mg total) by mouth 2 (two) times daily. (Patient not taking: Reported on 08/02/2018) 20 tablet 0   olopatadine (PATANOL) 0.1 % ophthalmic solution Place 1 drop into  both eyes 2 (two) times daily. (Patient not taking: Reported on 08/02/2018) 5 mL 12   Omega-3 Fatty Acids (FISH OIL) 1000 MG CAPS Take 1,000 mg by mouth 2 (two) times daily.     polyethylene glycol powder (GLYCOLAX/MIRALAX) powder Take 17 g by mouth 2 (two) times daily. (Patient not taking: Reported on 08/02/2018) 255 g 0   VIAGRA 100 MG tablet Take 1 tablet (100 mg total) by mouth as needed for erectile dysfunction. (Patient not taking: Reported on 08/02/2018) 6 tablet 0   No current facility-administered medications on file prior to visit.     Review of Systems  Constitutional: Negative for malaise/fatigue and weight loss.  Respiratory: Positive for shortness of breath (On Exertion, stable). Negative for cough and hemoptysis.        Snoring: On CPAP for sleep apnea  Cardiovascular: Negative for chest pain, palpitations, claudication and leg swelling.  Gastrointestinal: Positive for constipation. Negative for abdominal pain, blood in stool, heartburn and vomiting.  Genitourinary: Negative for dysuria.  Musculoskeletal: Positive for back pain (has bulging disk) and joint pain (right wrist; also recently injured right knee). Negative for myalgias.       Joint Stiffness - right shoulder  Neurological: Positive for loss of consciousness. Negative for dizziness, focal weakness and headaches.  Endo/Heme/Allergies: Does not bruise/bleed easily.  Psychiatric/Behavioral: Negative for depression. The patient is not nervous/anxious.   All other systems reviewed and are negative.      Objective:  Height 5\' 8"  (1.727 m), weight  267 lb 1.6 oz (121.2 kg), SpO2 93 %. Body mass index is 40.61 kg/m.   08/02/18 9:24 AM  08/02/18 9:36 AM  08/02/18 9:38 AM      BP  122/68  113/69  111/70   BP Location  RightArm  RightArm  RightArm   Patient Position  Supine  Sitting  Standing   Cuff Size  Normal  Normal  Normal   Pulse  82  85  91   SpO2  96%  96%  93%   Weight  267lb14.4oz(121.5kg)  267lb1.6oz(121.2kg)  267lb1.6oz(121.2kg)   Height  5'8"(1.750m)  5'8"(1.729m)  5'8"(1.783m)    Physical Exam  Constitutional: He appears well-developed. No distress.  Morbidly obese  HENT:  Head: Atraumatic.  Eyes: Conjunctivae are normal.  Neck: Neck supple. No JVD present. No thyromegaly present.  Cardiovascular: Normal rate, regular rhythm, intact distal pulses and normal pulses. Exam reveals no gallop.  Murmur heard.  Harsh early systolic murmur is present with a grade of 3/6 at the upper left sternal border. Pulmonic area Pulses:      Carotid pulses are on the right side with bruit and on the left side with bruit. S2 Physiologic splitting  Pulmonary/Chest: Effort normal and breath sounds normal.  Abdominal: Soft. Bowel sounds are normal.  obese  Musculoskeletal: Normal range of motion.        General: No edema.  Neurological: He is alert.  Skin: Skin is warm and dry.  Psychiatric: He has a normal mood and affect.    CARDIAC STUDIES:   Chest x-ray 12/14/2017: Heart size mildly enlarged, pulmonary artery enlargement unchanged from prior study dated 03/20/05/2017, findings consistent with pulmonary artery hypertension.  Ultrasound of the thyroid 06/25/2018: Bilateral thyroid nodules.  Being followed by endocrinology.   Echocardiogram 02/20/2018: Left ventricle cavity is normal in size. Moderate concentric hypertrophy of the left ventricle. Normal global wall motion. Doppler evidence of grade I (impaired) diastolic dysfunction, normal LAP. Calculated EF 55%. Left atrial cavity  is moderately dilated. Moderate (Grade II) mitral regurgitation. Mild tricuspid regurgitation. Estimated pulmonary artery systolic pressure 25 mmHg. Mild pulmonic valve stenosis. Mean PG 12 mmHg. Trace pulmonic regurgitation. No significant change compared to prior study in 2017.  Carotid artery duplex 06/19/2018: Minimal heterogeneous calcific plaque but no significant stenosis. No significant change from  Carotid artery duplex 03/18/2014, 07/26/2010.  TEE 08/02/11: Mild pulmonary stenosis. Very Small PFO. Normal LVEF. Mild MR.   Recent Labs:  Labs 07/27/2018: TSH, free T4 normal.   Assessment & Recommendations:   Syncope, unspecified syncope type - Plan: EKG 12-Lead, PCV ECHOCARDIOGRAM COMPLETE, CARDIAC EVENT MONITOR, PCV MYOCARDIAL PERFUSION WO LEXISCAN, Comprehensive metabolic panel  Nonrheumatic pulmonary valve stenosis - Plan: PCV ECHOCARDIOGRAM COMPLETE  Essential hypertension - Plan: Comprehensive metabolic panel, Lipid Profile, CBC  Bifascicular block  Class 3 severe obesity due to excess calories without serious comorbidity with body mass index (BMI) of 40.0 to 44.9 in adult [facility]) - Plan: Lipid Profile  OSA (obstructive sleep apnea) - Follows Dr. Annamaria Boots and uses Mouth Guard.  Laboratory examination  EKG 08/02/2018: Normal sinus rhythm at the rate of 71 bpm, bi-atrial enlargement, left axis deviation, left anterior fascicular block.  Right bundle branch block. No significant change from  EKG 02/24/2017, 02/02/2013.  EKG 02/25/2016: Sinus rhythm at a rate of 76 bpm, left axis deviation, left anterior fascicular block, right bundle branch block. Persistence of S wave in precordial leads. Pulmonary disease pattern. No significant change from EKG on 02/20/2015.   Recommendation:  Patient presenting with unexplained syncope on 07/26/2018 while sitting on a chair, slumped down and fell and has had head trauma and has been having headaches since then which is gotten worse over the past 3  days.  He does have pulmonary stenosis, recent chest x-ray reviewed, the pulmonary artery dilatation is related to poststenotic dilatation.  Murmur appears to be slightly more pronounced, he has widely split 2nd sound related to underlying right bundle branch block and also pulmonary stenosis.  He will need repeat echocardiogram.  In view of syncope, I'll also set him up for an exercise Myoview stress test.  I discussed with him regarding his weight, has morbid obesity and also has sleep apnea and has been using oral processes for the same and states that it is been helpful.  He has a follow-up appointment with Dr. Annamaria Boots in the next few days.  EKG does not reveal any significant abnormality except for bifascicular block.  I discussed with him that he should not drive for the next 6 months since the event.  I'll also perform an event monitor for 30 days to exclude arrhythmic etiology.  Office visit after the test.  He needs lipid profile testing along with CBC and CMP, he has thyroid nodules and recent thyroid testing is normal. Carotid artery duplex from [facility] was also reviewed.  He does not have significant carotid stenosis although has mild to moderate plaque.   This was a 60 minute office encounter with evaluation of external records, patient making regarding his complex medical issues including valvular heart disease, hypertension, obesity and sleep apnea along with evaluation of labs.  Greater than 50% of the time was spent on face-to-face encounter.   Adrian Prows, MD, [facility] 08/02/2018, 10:50 AM [facility] [place] Pager: [phone] Office: [phone] If no answer Cell [phone]

## 2018-08-02 ENCOUNTER — Ambulatory Visit: Payer: Medicare HMO | Admitting: Cardiology

## 2018-08-02 ENCOUNTER — Ambulatory Visit: Payer: Medicare HMO

## 2018-08-02 ENCOUNTER — Encounter: Payer: Self-pay | Admitting: Cardiology

## 2018-08-02 ENCOUNTER — Other Ambulatory Visit: Payer: Self-pay

## 2018-08-02 VITALS — Ht 68.0 in | Wt 267.1 lb

## 2018-08-02 DIAGNOSIS — R55 Syncope and collapse: Secondary | ICD-10-CM

## 2018-08-02 DIAGNOSIS — I452 Bifascicular block: Secondary | ICD-10-CM | POA: Diagnosis not present

## 2018-08-02 DIAGNOSIS — I370 Nonrheumatic pulmonary valve stenosis: Secondary | ICD-10-CM | POA: Diagnosis not present

## 2018-08-02 DIAGNOSIS — G4733 Obstructive sleep apnea (adult) (pediatric): Secondary | ICD-10-CM

## 2018-08-02 DIAGNOSIS — Z0189 Encounter for other specified special examinations: Secondary | ICD-10-CM

## 2018-08-02 DIAGNOSIS — I10 Essential (primary) hypertension: Secondary | ICD-10-CM | POA: Diagnosis not present

## 2018-08-02 DIAGNOSIS — Z6841 Body Mass Index (BMI) 40.0 and over, adult: Secondary | ICD-10-CM

## 2018-08-05 ENCOUNTER — Telehealth: Payer: Self-pay | Admitting: *Deleted

## 2018-08-05 DIAGNOSIS — R55 Syncope and collapse: Secondary | ICD-10-CM

## 2018-08-05 NOTE — Telephone Encounter (Signed)
LMOM

## 2018-08-05 NOTE — Telephone Encounter (Signed)
Covid-19 travel screening questions  Have you traveled in the last 14 days? no If yes where?  Do you now or have you had a fever in the last 14 days? no  Do you have any respiratory symptoms of shortness of breath or cough now or in the last 14 days? no  Do you have a medical history of Congestive Heart Failure? no  Do you have a medical history of lung disease? no  Do you have any family members or close contacts with diagnosed or suspected Covid-19? no       

## 2018-08-06 ENCOUNTER — Ambulatory Visit (AMBULATORY_SURGERY_CENTER): Payer: Self-pay | Admitting: *Deleted

## 2018-08-06 ENCOUNTER — Ambulatory Visit: Payer: Medicare HMO | Admitting: Internal Medicine

## 2018-08-06 ENCOUNTER — Other Ambulatory Visit: Payer: Self-pay

## 2018-08-06 ENCOUNTER — Encounter: Payer: Self-pay | Admitting: Gastroenterology

## 2018-08-06 ENCOUNTER — Telehealth: Payer: Self-pay | Admitting: *Deleted

## 2018-08-06 VITALS — Temp 97.4°F | Ht 68.0 in | Wt 268.4 lb

## 2018-08-06 DIAGNOSIS — Z1211 Encounter for screening for malignant neoplasm of colon: Secondary | ICD-10-CM

## 2018-08-06 MED ORDER — NA SULFATE-K SULFATE-MG SULF 17.5-3.13-1.6 GM/177ML PO SOLN: ORAL | 0 refills | Status: DC

## 2018-08-06 NOTE — Telephone Encounter (Signed)
  Dr. Fuller Plan, Pt is wearing a halter monitor until 09-02-18 d/t a syncopal episode.  Just an FYI and making sure he is ok to proceed. Please advise, Freda Munro

## 2018-08-06 NOTE — Telephone Encounter (Signed)
Pt. Notified that Dr. Fuller Plan instructions is to call after monitor has been removed and read by doctor to reschedule procedure,pt. Verbalize understanding to call back to reschedule procedure.

## 2018-08-06 NOTE — Telephone Encounter (Signed)
Colonoscopy should be delayed until his holter monitor has been completed, read by cardiology and cleared by cardiology to proceed.

## 2018-08-06 NOTE — Progress Notes (Signed)
Pt does have a hx of constipation- uses stool softeners daily.  Two day prep given  No hx No egg or soy allergy known to patient  No issues with past sedation with any surgeries  or procedures, no intubation problems  No diet pills per patient No home 02 use per patient  No blood thinners per patient  Pt denies issues with constipation  No A fib or A flutter   Dr. Fuller Plan, Pt is wearing a halter monitor until 09-02-18 d/t a syncopal episode.  Just an FYI and making sure he is ok to proceed. Please advise, Bethann Humble sent to Dr. Fuller Plan -see advice

## 2018-08-06 NOTE — Telephone Encounter (Signed)
I left a message for pt to make an appt after his echo on 08/09/18

## 2018-08-09 ENCOUNTER — Other Ambulatory Visit: Payer: Medicare HMO

## 2018-08-13 ENCOUNTER — Other Ambulatory Visit: Payer: Medicare HMO

## 2018-08-20 ENCOUNTER — Encounter: Payer: Medicare HMO | Admitting: Gastroenterology

## 2018-09-03 DIAGNOSIS — R55 Syncope and collapse: Secondary | ICD-10-CM | POA: Diagnosis not present

## 2018-09-06 LAB — COMPREHENSIVE METABOLIC PANEL
ALT: 22 IU/L (ref 0–44)
AST: 26 IU/L (ref 0–40)
Albumin/Globulin Ratio: 2 (ref 1.2–2.2)
Albumin: 4.3 g/dL (ref 3.7–4.7)
Alkaline Phosphatase: 86 IU/L (ref 39–117)
BUN/Creatinine Ratio: 17 (ref 10–24)
BUN: 24 mg/dL (ref 8–27)
Bilirubin Total: 0.9 mg/dL (ref 0.0–1.2)
CO2: 26 mmol/L (ref 20–29)
CREATININE: 1.42 mg/dL — AB (ref 0.76–1.27)
Calcium: 9.9 mg/dL (ref 8.6–10.2)
Chloride: 103 mmol/L (ref 96–106)
GFR calc Af Amer: 57 mL/min/{1.73_m2} — ABNORMAL LOW (ref >59)
GFR, EST NON AFRICAN AMERICAN: 49 mL/min/{1.73_m2} — AB (ref >59)
GLOBULIN, TOTAL: 2.2 g/dL (ref 1.5–4.5)
Glucose: 92 mg/dL (ref 65–99)
Potassium: 4.1 mmol/L (ref 3.5–5.2)
SODIUM: 142 mmol/L (ref 134–144)
TOTAL PROTEIN: 6.5 g/dL (ref 6.0–8.5)

## 2018-09-06 LAB — CBC
HEMOGLOBIN: 12.6 g/dL — AB (ref 13.0–17.7)
Hematocrit: 37.8 % (ref 37.5–51.0)
MCH: 32.6 pg (ref 26.6–33.0)
MCHC: 33.3 g/dL (ref 31.5–35.7)
MCV: 98 fL — ABNORMAL HIGH (ref 79–97)
Platelets: 187 10*3/uL (ref 150–450)
RBC: 3.86 x10E6/uL — AB (ref 4.14–5.80)
RDW: 11.4 % — ABNORMAL LOW (ref 11.6–15.4)
WBC: 14.2 10*3/uL — ABNORMAL HIGH (ref 3.4–10.8)

## 2018-09-06 LAB — LIPID PANEL
CHOL/HDL RATIO: 3.8 ratio (ref 0.0–5.0)
Cholesterol, Total: 151 mg/dL (ref 100–199)
HDL: 40 mg/dL (ref >39)
LDL CALC: 87 mg/dL (ref 0–99)
Triglycerides: 121 mg/dL (ref 0–149)
VLDL Cholesterol Cal: 24 mg/dL (ref 5–40)

## 2018-09-06 NOTE — Progress Notes (Signed)
S/w pt advised him of results

## 2018-09-10 ENCOUNTER — Other Ambulatory Visit: Payer: Self-pay

## 2018-09-10 ENCOUNTER — Other Ambulatory Visit: Payer: Medicare HMO

## 2018-09-10 ENCOUNTER — Ambulatory Visit (INDEPENDENT_AMBULATORY_CARE_PROVIDER_SITE_OTHER): Payer: Medicare HMO

## 2018-09-10 DIAGNOSIS — R55 Syncope and collapse: Secondary | ICD-10-CM | POA: Diagnosis not present

## 2018-09-10 NOTE — Progress Notes (Signed)
Lvm for pt to call back. 

## 2018-09-14 ENCOUNTER — Ambulatory Visit: Payer: Medicare HMO | Admitting: Cardiology

## 2018-09-19 ENCOUNTER — Other Ambulatory Visit: Payer: Self-pay

## 2018-09-19 ENCOUNTER — Ambulatory Visit (INDEPENDENT_AMBULATORY_CARE_PROVIDER_SITE_OTHER): Payer: Medicare HMO

## 2018-09-19 DIAGNOSIS — R55 Syncope and collapse: Secondary | ICD-10-CM

## 2018-09-19 DIAGNOSIS — I370 Nonrheumatic pulmonary valve stenosis: Secondary | ICD-10-CM

## 2018-09-25 ENCOUNTER — Ambulatory Visit (INDEPENDENT_AMBULATORY_CARE_PROVIDER_SITE_OTHER): Payer: Medicare HMO | Admitting: Cardiology

## 2018-09-25 ENCOUNTER — Encounter: Payer: Self-pay | Admitting: Cardiology

## 2018-09-25 ENCOUNTER — Other Ambulatory Visit: Payer: Self-pay

## 2018-09-25 VITALS — Ht 68.0 in | Wt 266.0 lb

## 2018-09-25 DIAGNOSIS — I10 Essential (primary) hypertension: Secondary | ICD-10-CM

## 2018-09-25 DIAGNOSIS — I452 Bifascicular block: Secondary | ICD-10-CM | POA: Diagnosis not present

## 2018-09-25 DIAGNOSIS — R55 Syncope and collapse: Secondary | ICD-10-CM

## 2018-09-25 DIAGNOSIS — Z6841 Body Mass Index (BMI) 40.0 and over, adult: Secondary | ICD-10-CM

## 2018-09-25 DIAGNOSIS — I370 Nonrheumatic pulmonary valve stenosis: Secondary | ICD-10-CM

## 2018-09-25 NOTE — Progress Notes (Signed)
Virtual Visit via Video Note: This visit type was conducted due to national recommendations for restrictions regarding the COVID-19 Pandemic (e.g. social distancing).  This format is felt to be most appropriate for this patient at this time.  All issues noted in this document were discussed and addressed.  No physical exam was performed (except for noted visual exam findings with Telehealth visits).  The patient has consented to conduct a Telehealth visit and understands insurance will be billed.   I connected with@, on 09/25/18 at  by a video enabled telemedicine application and verified that I am speaking with the correct person using two identifiers.   I discussed the limitations of evaluation and management by telemedicine and the availability of in person appointments. The patient expressed understanding and agreed to proceed.   I have discussed with patient regarding the safety during COVID Pandemic and steps and precautions to be taken including social distancing, frequent hand wash and use of detergent soap, gels with the patient. I asked the patient to avoid touching mouth, nose, eyes, ears with the hands. I encouraged regular walking around the neighborhood and exercise and regular diet, as long as social distancing can be maintained.  Primary Physician/Referring:  [name], MD  Patient ID: Eugene [name], male    DOB: [birth year], 73 y.o.   MRN: [id number]  Chief Complaint  Patient presents with  . Hypertension  . Follow-up    HPI: Eugene [name]  is a 73 y.o. male  with pulmonary valve stenosis and chronic dyspnea, hypertension, morbid obesity, obstructive sleep apnea and uses oral prosthesis, presents to the office to be evaluated for syncope that he had on 07/26/2018 while he was sitting on a chair, suddenly had syncope and significant trauma to his head with a bruise and also laceration of his mouth.  Since then he felt well for a day or 2, then started having chest pain.   A CT scan of the head was negative for hematoma and right knee swelling from fall. No recurrence of syncope and headache has resolved.   He had no premonitory symptoms, onset was sudden.  He has not had any recurrence.  Denies chest pain and dyspnea has remained stable.  No PND or orthopnea.  He does have history of rheumatic fever as a child. He has had previously TEE in 2016 that showed mild mitral regurgitation and mild pulmonary valve stenosis, the morphology did not appear to be of rheumatic heart disease.   Past Medical History:  Diagnosis Date  . Allergic rhinitis due to pollen   . Allergy    seasonal  . Anxiety   . Blood transfusion without reported diagnosis    do not except blood transfusions.  . Cataract    beginning stage  . DDD (degenerative disc disease), cervical    Severe w/ osteophytes on xray 3/16  . ED (erectile dysfunction)   . Fainting    07/26/18  . GERD (gastroesophageal reflux disease)   . Heart murmur   . History of Holter monitoring    started on march 19,2020  . Hypertension   . OBESITY   . Osteoarthritis   . Pulmonary stenosis, valvar    TEE 07/2011: stable, LVEF 55%, small PFO  . Refusal of blood transfusions as patient is Jehovah's Witness   . Rheumatic fever    residual pulm stenosis, follows with Merita Hawks every 63mo  . RHINITIS, CHRONIC   . Sleep apnea    wear mouth guard  . SLEEP APNEA, OBSTRUCTIVE  dx 2000   NPSG 2000:  AHI 76/hr CPAP titrated to 11cm 2002     Past Surgical History:  Procedure Laterality Date  . COLONOSCOPY    . ROTATOR CUFF REPAIR     2018  . TEE WITHOUT CARDIOVERSION  08/02/2011   Procedure: TRANSESOPHAGEAL ECHOCARDIOGRAM (TEE);  Surgeon: Laverda Page, MD;  Location: [facility];  Service: Cardiovascular;  Laterality: N/A;    Social History   Socioeconomic History  . Marital status: Married    Spouse name: Not on file  . Number of children: 3  . Years of education: Not on file  . Highest education level: Not  on file  Occupational History  . Occupation: retired.      Comment: prev worked as Therapist, music for [employer]  . Financial resource strain: Not on file  . Food insecurity:    Worry: Not on file    Inability: Not on file  . Transportation needs:    Medical: Not on file    Non-medical: Not on file  Tobacco Use  . Smoking status: Never Smoker  . Smokeless tobacco: Never Used  Substance and Sexual Activity  . Alcohol use: Yes    Alcohol/week: 0.0 standard drinks    Comment: on weekends liquor  . Drug use: No  . Sexual activity: Yes  Lifestyle  . Physical activity:    Days per week: Not on file    Minutes per session: Not on file  . Stress: Not on file  Relationships  . Social connections:    Talks on phone: Not on file    Gets together: Not on file    Attends religious service: Not on file    Active member of club or organization: Not on file    Attends meetings of clubs or organizations: Not on file    Relationship status: Not on file  . Intimate partner violence:    Fear of current or ex partner: Not on file    Emotionally abused: Not on file    Physically abused: Not on file    Forced sexual activity: Not on file  Other Topics Concern  . Not on file  Social History Narrative  . Not on file    Review of Systems  Constitution: Negative for chills, decreased appetite, malaise/fatigue and weight gain.  Cardiovascular: Positive for dyspnea on exertion and syncope. Negative for leg swelling.  Endocrine: Negative for cold intolerance.  Hematologic/Lymphatic: Does not bruise/bleed easily.  Musculoskeletal: Positive for back pain (chronic from bulging disc) and joint pain (right wrist and knee). Negative for joint swelling.  Gastrointestinal: Negative for abdominal pain, anorexia and change in bowel habit.  Neurological: Negative for headaches and light-headedness.  Psychiatric/Behavioral: Negative for depression and substance abuse.  All other  systems reviewed and are negative.     Objective  Height 5\' 8"  (1.727 m), weight 266 lb (120.7 kg). Body mass index is 40.45 kg/m. Physical exam not performed or limited due to virtual visit.  Patient appeared to be in no distress, Neck was supple, respiration was not labored.  Please see exam details from prior visit is as below.    Physical Exam  Constitutional: He appears well-developed. No distress.  Morbidly obese  HENT:  Head: Atraumatic.  Eyes: Conjunctivae are normal.  Neck: Neck supple. No JVD present. No thyromegaly present.  Cardiovascular: Normal rate, regular rhythm, intact distal pulses and normal pulses. Exam reveals no gallop.  Murmur heard.  Harsh early systolic  murmur is present with a grade of 3/6 at the upper left sternal border. Pulmonic area Pulses:      Carotid pulses are on the right side with bruit and on the left side with bruit. S2 Physiologic splitting  Pulmonary/Chest: Effort normal and breath sounds normal.  Abdominal: Soft. Bowel sounds are normal.  obese  Musculoskeletal: Normal range of motion.        General: No edema.  Neurological: He is alert.  Skin: Skin is warm and dry.  Psychiatric: He has a normal mood and affect.   Radiology: No results found.  Laboratory examination:    Labs 07/27/2018: TSH, free T4 normal.  CMP Latest Ref Rng & Units 09/05/2018 04/27/2015 07/22/2014  Glucose 65 - 99 mg/dL 92 93 90  BUN 8 - 27 mg/dL 24 18 26(H)  Creatinine 0.76 - 1.27 mg/dL 1.42(H) 1.30(H) 1.55(H)  Sodium 134 - 144 mmol/L 142 139 141  Potassium 3.5 - 5.2 mmol/L 4.1 3.7 4.2  Chloride 96 - 106 mmol/L 103 105 105  CO2 20 - 29 mmol/L 26 - 33(H)  Calcium 8.6 - 10.2 mg/dL 9.9 - 9.5  Total Protein 6.0 - 8.5 g/dL 6.5 - 7.2  Total Bilirubin 0.0 - 1.2 mg/dL 0.9 - 0.8  Alkaline Phos 39 - 117 IU/L 86 - 70  AST 0 - 40 IU/L 26 - 36  ALT 0 - 44 IU/L 22 - 23   CBC Latest Ref Rng & Units 09/05/2018 04/27/2015 04/27/2015  WBC 3.4 - 10.8 x10E3/uL 14.2(H) -  11.3(H)  Hemoglobin 13.0 - 17.7 g/dL 12.6(L) 14.3 13.5  Hematocrit 37.5 - 51.0 % 37.8 42.0 40.7  Platelets 150 - 450 x10E3/uL 187 - 159   Lipid Panel     Component Value Date/Time   CHOL 151 09/05/2018 0826   TRIG 121 09/05/2018 0826   HDL 40 09/05/2018 0826   CHOLHDL 3.8 09/05/2018 0826   CHOLHDL 3 07/22/2014 1138   VLDL 19.8 07/22/2014 1138   LDLCALC 87 09/05/2018 0826   HEMOGLOBIN A1C Lab Results  Component Value Date   HGBA1C 4.6 07/22/2014   TSH No results for input(s): TSH in the last 8760 hours.  PRN Meds:. Medications Discontinued During This Encounter  Medication Reason  . amLODipine (NORVASC) 5 MG tablet Dose change  . benzonatate (TESSALON) 100 MG capsule Error  . Cetirizine HCl 10 MG CAPS Error  . Chlorpheniramine-Acetaminophen (CORICIDIN HBP COLD/FLU PO) Error  . clonazePAM (KLONOPIN) 1 MG tablet Error  . Cholecalciferol (VITAMIN D) 2000 UNITS CAPS Error  . fluticasone furoate-vilanterol (BREO ELLIPTA) 100-25 MCG/INH AEPB Error  . gabapentin (NEURONTIN) 100 MG capsule Error  . ibuprofen (ADVIL,MOTRIN) 800 MG tablet Error  . Misc Natural Products (OSTEO BI-FLEX ADV DOUBLE ST PO) Error  . nabumetone (RELAFEN) 750 MG tablet Error  . Na Sulfate-K Sulfate-Mg Sulf 17.5-3.13-1.6 GM/177ML SOLN Error  . naproxen (NAPROSYN) 375 MG tablet Error  . olopatadine (PATANOL) 0.1 % ophthalmic solution Error  . Omega-3 Fatty Acids (FISH OIL) 1000 MG CAPS Error  . polyethylene glycol powder (GLYCOLAX/MIRALAX) powder Error  . Turmeric 500 MG CAPS Error  . VIAGRA 100 MG tablet Error   Current Meds  Medication Sig  . Acetaminophen (TYLENOL ARTHRITIS PAIN PO) Take by mouth 2 (two) times daily.  Marland Kitchen allopurinol (ZYLOPRIM) 100 MG tablet Take 1 tablet by mouth daily.  Marland Kitchen amLODipine (NORVASC) 10 MG tablet Take 10 mg by mouth daily.  Marland Kitchen azelastine (ASTELIN) 0.1 % nasal spray PLACE 1 TO 2 SPRAYS IN  EACH NOSTRIL TWICE DAILY AS NEEDED  . carvedilol (COREG) 25 MG tablet Take 1 tablet (25  mg total) by mouth 2 (two) times daily with a meal.  . Docusate Sodium (STOOL SOFTENER LAXATIVE PO) Take by mouth as needed. [facility] over the counter.   . fluticasone (FLONASE) 50 MCG/ACT nasal spray INSTILL 1 TO 2 SPRAYS INTO EACH NOSTRIL ONCE DAILY  . loratadine (CLARITIN) 10 MG tablet Take 10 mg by mouth daily as needed for allergies.  Marland Kitchen losartan-hydrochlorothiazide (HYZAAR) 50-12.5 MG per tablet Take 1 tablet by mouth 2 (two) times daily.  Marland Kitchen UNABLE TO FIND 2 (two) times daily. Super beta prostate  . [DISCONTINUED] polyethylene glycol powder (GLYCOLAX/MIRALAX) powder Take 17 g by mouth 2 (two) times daily.    Cardiac Studies:   Echocardiogram 09/19/2018: Left ventricle cavity is normal in size. Normal left ventricular wall thickness. Abnormal septal wall motion due interventricular conduction abnormality.  Doppler evidence of grade I (impaired) diastolic dysfunction, normal LAP. Calculated EF 66%. Left atrial cavity is moderately dilated. Mild to moderate mitral regurgitation. Mild tricuspid regurgitation. Estimated pulmonary artery systolic pressure 20 mmHg. Mild pulmonic valve stenosis. Peak gradient 19 mmHg. No significant change compared to previous study dated 02/20/2018.   Exercise myoview stress 09/10/2018:  1. The patient performed treadmill exercise using Bruce protocol, completing 5:15 minutes. The patient completed an estimated workload of 6.7 METS, reaching 103% of the maximum predicted heart rate. Exercise capacity was low. Hemodynamic response was normal. Stress symptoms included dyspnea. The resting electrocardiogram demonstrated normal sinus rhythm, RBBB + LAHB and secondary ST-T changes in anteroseptal leads.  The stress electrocardiogram showed sinus tachycardia, RBBB + LAHB, occasional PVC's and secondary ST-T changes anteroseptal leads, unchanged compared to resting EKG.  No ischemic changes seen on stress electrocardiogram.  2. The overall quality of the study is good.   Left ventricular cavity is noted to be normal on the rest and stress studies.  Gated SPECT images reveal normal myocardial thickening and wall motion.  The left ventricular ejection fraction was calculated or visually estimated to be 88%. SPECT rest and stress images reveal decreased tracer uptake in apical, inferior myocardium, likely due to diaphragmatic attenuation.  3. Low risk study.   Event Monitor for 30 days Start date 08/05/2018: Indications syncope and collapse No symptoms reported.  Maximum heart rate 124 bpm, minimum heart rate 58 bpm, sinus rhythm.  Occasional PACs. No heart block.  Chest x-ray 12/14/2017: Heart size mildly enlarged, pulmonary artery enlargement unchanged from prior study dated 03/20/05/2017, findings consistent with pulmonary artery hypertension.  Ultrasound of the thyroid 06/25/2018: Bilateral thyroid nodules.  Being followed by endocrinology.  Carotid artery duplex 06/19/2018: Minimal heterogeneous calcific plaque but no significant stenosis. No significant change from  Carotid artery duplex 03/18/2014, 07/26/2010.  Assessment   Syncope, unspecified syncope type  Nonrheumatic pulmonary valve stenosis  Essential hypertension  Bifascicular block  Class 3 severe obesity due to excess calories without serious comorbidity with body mass index (BMI) of 40.0 to 44.9 in adult [facility])  EKG 08/02/2018: Normal sinus rhythm at the rate of 71 bpm, bi-atrial enlargement, left axis deviation, left anterior fascicular block.  Right bundle branch block. No significant change from  EKG 02/24/2017, 02/02/2013.  EKG 02/25/2016: Sinus rhythm at a rate of 76 bpm, left axis deviation, left anterior fascicular block, right bundle branch block. Persistence of S wave in precordial leads. Pulmonary disease pattern. No significant change from EKG on 02/20/2015.  Recommendations:   Patient presenting with unexplained syncope  on 07/26/2018 while sitting on a chair, slumped down and fell and  has had head trauma and knee injury as well. No recurrence.   I have reviewed his labs, renal function is remained stable, CBC has been stable, lipids are under good control.  Echocardiogram reveals no change in pulmonary valve stenosis.  Exercise nuclear stress test on 09/10/18 revealed low exercise tolerance but no EKG or nuclear perfusion abnormality to suggest significant CAD, noticed today.  Event monitor did not reveal anything significant with regard to explaining his syncope.  Also reviewed his carotid duplex and has no significant stenosis except for minimal plaque.  This point advised him not to drive for at least 6 months to exclude recurrence of syncope.  Cannot exclude seizure disorder and noncardiac etiology for his syncope.  No changes in the medications were done.  I would like to see him back in 6 months for follow-up.  Adrian Prows, MD, [facility] 09/25/2018, 4:35 PM [facility]. [place] Pager: [phone] Office: [phone] If no answer Cell [phone]

## 2018-10-10 ENCOUNTER — Other Ambulatory Visit: Payer: Self-pay | Admitting: Family Medicine

## 2018-10-13 ENCOUNTER — Encounter: Payer: Self-pay | Admitting: Cardiology

## 2018-10-15 ENCOUNTER — Other Ambulatory Visit: Payer: Self-pay | Admitting: Family Medicine

## 2018-10-15 DIAGNOSIS — M5416 Radiculopathy, lumbar region: Secondary | ICD-10-CM

## 2018-10-15 DIAGNOSIS — M542 Cervicalgia: Secondary | ICD-10-CM

## 2018-11-06 ENCOUNTER — Ambulatory Visit: Payer: Medicare HMO | Admitting: Internal Medicine

## 2018-11-06 ENCOUNTER — Other Ambulatory Visit: Payer: Self-pay

## 2018-11-06 ENCOUNTER — Encounter: Payer: Self-pay | Admitting: Internal Medicine

## 2018-11-06 DIAGNOSIS — J302 Other seasonal allergic rhinitis: Secondary | ICD-10-CM

## 2018-11-06 DIAGNOSIS — J3089 Other allergic rhinitis: Secondary | ICD-10-CM

## 2018-11-06 DIAGNOSIS — G4733 Obstructive sleep apnea (adult) (pediatric): Secondary | ICD-10-CM | POA: Diagnosis not present

## 2018-11-06 MED ORDER — AZELASTINE HCL 0.1 % NA SOLN: NASAL | 12 refills | Status: DC

## 2018-11-06 MED ORDER — FLUTICASONE PROPIONATE 50 MCG/ACT NA SUSP: NASAL | 12 refills | Status: DC

## 2018-11-06 NOTE — Progress Notes (Signed)
HPI male never smoker followed for Allergic rhinitis, OSA, history chronic sinusitis, HBP, RBBB/LAHB, Hx RF heart disease, Pulmonary Valve Stenosis NPSG 2000:  AHI 76/hr  Unattended Home Sleep Test-11/09/2015-moderate obstructive sleep apnea-AHI 24.4/hour, desaturation to 82%, body weight 257 pounds  ------------------------------------------------------------------------  08/03/17-  71 yoM never smoker followed for Allergic rhinitis, OSA, history chronic sinusitis, HBP, RBBB/LAHB, Hx RF heart disease, Pulmonary Valve Stenosis CPAP auto/[employer]-noncompliant                Wife is here ----OSA and trouble with cough/sneezing. Pt states he has been working with Dr. Ron Parker on oral appliance. Pt noted seeing blood tinged sputum yesterday when coughing. Has not seen blood with sneezing.  For 1 week has noted increased rhinorrhea and nasal congestion with cough, typical of his seasonal spring allergy flares.  Asked refill nasal sprays and recommendation on antihistamine.  Notices wheezing if he sits quietly.  Slight hemoptysis recently with hard cough.  Denies fever, purulent material, adenopathy.  11/06/2018- 17 yoM never smoker followed for Allergic rhinitis, OSA, history chronic sinusitis, HBP, RBBB/LAHB, Hx RF heart disease, Pulmonary Valve Stenosis CPAP auto/[employer]-noncompliant    -----OSA - oral appliance w/ Dr. Ron Parker, pt states breathing is at baseline; needs refills of Astelin & Flonase nasal sprays Body weight today 270 lbs He thinks oral appliance helps his sleep- says wife reports "not much" snore. Spring allergic rhinitis season managed with Astelin and Flonase- needs refills. Not asthmatic.  Cardiology continues to follow.   ROS-see HPI + = positive Constitutional:   No-   weight loss, night sweats, fevers, chills, fatigue, lassitude. HEENT:   No-  headaches, difficulty swallowing, tooth/dental problems, sore throat,       No- sneezing, itching, ear ache, +nasal congestion, no-post nasal drip,   CV:  No-   chest pain, orthopnea, PND, swelling in lower extremities, anasarca, dizziness, palpitations Resp: No-   shortness of breath with exertion or at rest.           +  productive cough,  No non-productive cough,  + coughing up of blood.              No-   change in color of mucus.  No- wheezing.   Skin: No-   rash or lesions. GI:  No-   heartburn, indigestion, abdominal pain, nausea, vomiting,  GU:   MS:  No-   joint pain or swelling.   Neuro-     nothing unusual Psych:  No- change in mood or affect. No depression or anxiety.  No memory loss.  OBJ- Physical Exam General- Alert, Oriented, Affect-appropriate, Distress- none acute, + obese.  Skin- rash-none, lesions- none, excoriation- none Lymphadenopathy- none Head- atraumatic            Eyes- Gross vision intact, PERRLA, conjunctivae and secretions clear,               + periorbital edema            Ears- Hearing, canals-normal            Nose-  +turbinate edema, +mild septal dev, No- mucus, polyps, erosion, perforation.             Throat- Mallampati III-IV , mucosa-clear , drainage- none, tonsils- atrophic Neck- flexible , trachea midline, no stridor , thyroid nl, carotid no bruit Chest - symmetrical excursion , unlabored           Heart/CV- RRR , murmur+ syst , no gallop  , no rub,  nl s1 s2                           - JVD- none , edema- none, stasis changes- none, varices- none           Lung-+ clear no-wheeze, cough- none , dullness-none, rub- none           Chest wall-  Abd-  Br/ Gen/ Rectal- Not done, not indicated Extrem- cyanosis- none, clubbing, none, atrophy- none, strength- nl Neuro- grossly intact to observation.

## 2018-11-06 NOTE — Assessment & Plan Note (Signed)
He seems motivated to ty to make oral appliance work for him. r

## 2018-11-06 NOTE — Patient Instructions (Signed)
Script sent refilling nasal sprays  Glad the oral appliance from Dr Ron Parker seems to be working for you.   Please call if we can help

## 2018-11-06 NOTE — Assessment & Plan Note (Signed)
Nasal sprays help Plan- refill Astelin and Flonase

## 2018-11-08 ENCOUNTER — Ambulatory Visit
Admission: RE | Admit: 2018-11-08 | Discharge: 2018-11-08 | Disposition: A | Discharge status: Home / Self Care | Payer: Medicare HMO | Source: Ambulatory Visit | Attending: Family Medicine | Admitting: Family Medicine

## 2018-11-08 ENCOUNTER — Other Ambulatory Visit: Payer: Self-pay

## 2018-11-08 DIAGNOSIS — M5416 Radiculopathy, lumbar region: Secondary | ICD-10-CM

## 2018-11-08 DIAGNOSIS — M542 Cervicalgia: Secondary | ICD-10-CM

## 2018-11-08 MED ORDER — GADOBENATE DIMEGLUMINE 529 MG/ML IV SOLN
20.0000 mL | Freq: Once | INTRAVENOUS | Status: AC | PRN
  Administered 2018-11-08: 20 mL via INTRAVENOUS

## 2019-03-15 ENCOUNTER — Other Ambulatory Visit: Payer: Self-pay | Admitting: Urology

## 2019-03-15 DIAGNOSIS — C61 Malignant neoplasm of prostate: Secondary | ICD-10-CM

## 2019-04-01 ENCOUNTER — Other Ambulatory Visit: Payer: Self-pay

## 2019-04-01 ENCOUNTER — Ambulatory Visit (INDEPENDENT_AMBULATORY_CARE_PROVIDER_SITE_OTHER): Payer: Medicare HMO | Admitting: Cardiology

## 2019-04-01 ENCOUNTER — Encounter: Payer: Self-pay | Admitting: Cardiology

## 2019-04-01 VITALS — BP 140/80 | HR 97 | Ht 68.0 in | Wt 271.3 lb

## 2019-04-01 DIAGNOSIS — I10 Essential (primary) hypertension: Secondary | ICD-10-CM

## 2019-04-01 DIAGNOSIS — I370 Nonrheumatic pulmonary valve stenosis: Secondary | ICD-10-CM | POA: Diagnosis not present

## 2019-04-01 DIAGNOSIS — I452 Bifascicular block: Secondary | ICD-10-CM | POA: Diagnosis not present

## 2019-04-01 DIAGNOSIS — R55 Syncope and collapse: Secondary | ICD-10-CM | POA: Diagnosis not present

## 2019-04-01 NOTE — Progress Notes (Signed)
Primary Physician/Referring:  [name], MD  Patient ID: Eugene [name], male    DOB: [birth year], 73 y.o.   MRN: [id number]  Chief Complaint  Patient presents with  . Loss of Consciousness  . Hypertension  . Follow-up    73months for pulmonary valve stenosis   HPI:    Eugene [name]  is a 73 y.o. AAM with pulmonary valve stenosis and chronic dyspnea, hypertension, morbid obesity, obstructive sleep apnea and uses oral prosthesis, mild pulmonary valve stenosis of non-rheumatic etiology by TEE in 2016 (H/O Rh Fever as child), presents to the office to be evaluated for syncope that he had on 07/26/2018 while he was sitting on a chair, suddenly had syncope and significant trauma to his head with a bruise and also laceration of his mouth.    He had no premonitory symptoms, onset was sudden.  He has not had any recurrence.  Denies chest pain and dyspnea has remained stable.  No PND or orthopnea. Due to chest pain, he also underwent nuclear stress testing April 2020 which was low risk without ischemia and echocardiogram did not reveal any new abnormality, event monitor was also unyielding.  He now presents for 73-month office visit.  No recurrence of syncope. Has chronic back pain.   Past Medical History:  Diagnosis Date  . Allergic rhinitis due to pollen   . Allergy    seasonal  . Anxiety   . Blood transfusion without reported diagnosis    do not except blood transfusions.  . Cataract    beginning stage  . DDD (degenerative disc disease), cervical    Severe w/ osteophytes on xray 3/16  . ED (erectile dysfunction)   . Fainting    07/26/18  . GERD (gastroesophageal reflux disease)   . Heart murmur   . History of Holter monitoring    started on march 19,2020  . Hypertension   . OBESITY   . Osteoarthritis   . Pulmonary stenosis, valvar    TEE 07/2011: stable, LVEF 55%, small PFO  . Refusal of blood transfusions as patient is Jehovah's Witness   . Rheumatic fever    residual  pulm stenosis, follows with Rhonda Linan every 93mo  . RHINITIS, CHRONIC   . Sleep apnea    wear mouth guard  . SLEEP APNEA, OBSTRUCTIVE dx 2000   NPSG 2000:  AHI 76/hr CPAP titrated to 11cm 2002    Past Surgical History:  Procedure Laterality Date  . COLONOSCOPY    . ROTATOR CUFF REPAIR     2018  . TEE WITHOUT CARDIOVERSION  08/02/2011   Procedure: TRANSESOPHAGEAL ECHOCARDIOGRAM (TEE);  Surgeon: Laverda Page, MD;  Location: [facility];  Service: Cardiovascular;  Laterality: N/A;   Social History   Socioeconomic History  . Marital status: Married    Spouse name: Not on file  . Number of children: 3  . Years of education: Not on file  . Highest education level: Not on file  Occupational History  . Occupation: retired.      Comment: prev worked as Therapist, music for [employer]  . Financial resource strain: Not on file  . Food insecurity    Worry: Not on file    Inability: Not on file  . Transportation needs    Medical: Not on file    Non-medical: Not on file  Tobacco Use  . Smoking status: Never Smoker  . Smokeless tobacco: Never Used  Substance and Sexual Activity  . Alcohol use: Yes  Alcohol/week: 0.0 standard drinks    Comment: on weekends liquor  . Drug use: No  . Sexual activity: Yes  Lifestyle  . Physical activity    Days per week: Not on file    Minutes per session: Not on file  . Stress: Not on file  Relationships  . Social Herbalist on phone: Not on file    Gets together: Not on file    Attends religious service: Not on file    Active member of club or organization: Not on file    Attends meetings of clubs or organizations: Not on file    Relationship status: Not on file  . Intimate partner violence    Fear of current or ex partner: Not on file    Emotionally abused: Not on file    Physically abused: Not on file    Forced sexual activity: Not on file  Other Topics Concern  . Not on file  Social History  Narrative  . Not on file   ROS  Review of Systems  Constitution: Negative for chills, decreased appetite, malaise/fatigue and weight gain.  Cardiovascular: Positive for dyspnea on exertion. Negative for leg swelling and syncope.  Endocrine: Negative for cold intolerance.  Hematologic/Lymphatic: Does not bruise/bleed easily.  Musculoskeletal: Positive for back pain (chronic from bulging disc) and joint pain (bilateral knee). Negative for joint swelling.  Gastrointestinal: Negative for abdominal pain, anorexia and change in bowel habit.  Neurological: Negative for headaches and light-headedness.  Psychiatric/Behavioral: Negative for depression and substance abuse.  All other systems reviewed and are negative.  Objective   Vitals with BMI 04/01/2019 11/06/2018 11/06/2018  Height 5\' 8"  - 5\' 8"   Weight 271 lbs 5 oz - 270 lbs 13 oz  BMI [id number] - [id number]  Systolic [id number]  Diastolic 80 62 60  Pulse 97 - 83      Physical Exam  Constitutional: He appears well-developed. No distress.  Morbidly obese  HENT:  Head: Atraumatic.  Eyes: Conjunctivae are normal.  Neck: Neck supple. No JVD present. No thyromegaly present.  Cardiovascular: Normal rate, regular rhythm, intact distal pulses and normal pulses. Exam reveals no gallop.  Murmur heard.  Harsh early systolic murmur is present with a grade of 3/6 at the upper left sternal border. Pulmonic area Pulses:      Carotid pulses are on the right side with bruit and on the left side with bruit. S2 Physiologic splitting  Pulmonary/Chest: Effort normal and breath sounds normal.  Abdominal: Soft. Bowel sounds are normal.  obese  Musculoskeletal: Normal range of motion.        General: No edema.  Neurological: He is alert.  Skin: Skin is warm and dry.  Psychiatric: He has a normal mood and affect.   Laboratory examination:   Recent Labs    09/05/18 0826  NA 142  K 4.1  CL 103  CO2 26  GLUCOSE 92  BUN 24  CREATININE 1.42*  CALCIUM  9.9  GFRNONAA 49*  GFRAA 57*   CrCl cannot be calculated (Patient's most recent lab result is older than the maximum 21 days allowed.).  CMP Latest Ref Rng & Units 09/05/2018 04/27/2015 07/22/2014  Glucose 65 - 99 mg/dL 92 93 90  BUN 8 - 27 mg/dL 24 18 26(H)  Creatinine 0.76 - 1.27 mg/dL 1.42(H) 1.30(H) 1.55(H)  Sodium 134 - 144 mmol/L 142 139 141  Potassium 3.5 - 5.2 mmol/L 4.1 3.7 4.2  Chloride 96 - 106 mmol/L  103 105 105  CO2 20 - 29 mmol/L 26 - 33(H)  Calcium 8.6 - 10.2 mg/dL 9.9 - 9.5  Total Protein 6.0 - 8.5 g/dL 6.5 - 7.2  Total Bilirubin 0.0 - 1.2 mg/dL 0.9 - 0.8  Alkaline Phos 39 - 117 IU/L 86 - 70  AST 0 - 40 IU/L 26 - 36  ALT 0 - 44 IU/L 22 - 23   CBC Latest Ref Rng & Units 09/05/2018 04/27/2015 04/27/2015  WBC 3.4 - 10.8 x10E3/uL 14.2(H) - 11.3(H)  Hemoglobin 13.0 - 17.7 g/dL 12.6(L) 14.3 13.5  Hematocrit 37.5 - 51.0 % 37.8 42.0 40.7  Platelets 150 - 450 x10E3/uL 187 - 159   Lipid Panel     Component Value Date/Time   CHOL 151 09/05/2018 0826   TRIG 121 09/05/2018 0826   HDL 40 09/05/2018 0826   CHOLHDL 3.8 09/05/2018 0826   CHOLHDL 3 07/22/2014 1138   VLDL 19.8 07/22/2014 1138   LDLCALC 87 09/05/2018 0826   HEMOGLOBIN A1C Lab Results  Component Value Date   HGBA1C 4.6 07/22/2014   TSH No results for input(s): TSH in the last 8760 hours. Medications and allergies   Allergies  Allergen Reactions  . Other Other (See Comments)    Adhesive tape causes skin to peel and darken.  Marland Kitchen Penicillins     Has patient had a PCN reaction causing immediate rash, facial/tongue/throat swelling, SOB or lightheadedness with hypotension:NO Has patient had a PCN reaction causing severe rash involving mucus membranes or skin necrosis:NO Has patient had a PCN reaction that required hospitalization NO Has patient had a PCN reaction occurring within the last 10 years: NO If all of the above answers are "NO", then may proceed with Cephalosporin use.     Current Outpatient  Medications  Medication Instructions  . Acetaminophen (TYLENOL ARTHRITIS PAIN PO) Oral, 2 times daily  . allopurinol (ZYLOPRIM) 100 MG tablet 1 tablet, Oral, Daily  . amLODipine (NORVASC) 10 mg, Oral, Daily  . azelastine (ASTELIN) 0.1 % nasal spray PLACE 1 TO 2 SPRAYS IN EACH NOSTRIL TWICE DAILY AS NEEDED  . carvedilol (COREG) 25 mg, Oral, 2 times daily with meals  . Docusate Sodium (STOOL SOFTENER LAXATIVE PO) Oral, As needed, [facility] over the counter.  . fluticasone (FLONASE) 50 MCG/ACT nasal spray INSTILL 1 TO 2 SPRAYS INTO EACH NOSTRIL ONCE DAILY  . gabapentin (NEURONTIN) 100 MG capsule Oral, 3 times daily  . loratadine (CLARITIN) 10 mg, Daily PRN  . losartan-hydrochlorothiazide (HYZAAR) 50-12.5 MG per tablet 1 tablet, Oral, 2 times daily  . Turmeric (QC TUMERIC COMPLEX PO) Oral, Pt reports taking Tumeric root     Radiology:  No results found. Cardiac Studies:   Echocardiogram 09/19/2018: Left ventricle cavity is normal in size. Normal left ventricular wall thickness. Abnormal septal wall motion due interventricular conduction abnormality.  Doppler evidence of grade I (impaired) diastolic dysfunction, normal LAP. Calculated EF 66%. Left atrial cavity is moderately dilated. Mild to moderate mitral regurgitation. Mild tricuspid regurgitation. Estimated pulmonary artery systolic pressure 20 mmHg. Mild pulmonic valve stenosis. Peak gradient 19 mmHg. No significant change compared to previous study dated 02/20/2018.   Exercise myoview stress 09/10/2018:  1. The patient performed treadmill exercise using Bruce protocol, completing 5:15 minutes. The patient completed an estimated workload of 6.7 METS, reaching 103% of the maximum predicted heart rate. Exercise capacity was low. Hemodynamic response was normal. Stress symptoms included dyspnea. The resting electrocardiogram demonstrated normal sinus rhythm, RBBB + LAHB and secondary ST-T changes in  anteroseptal leads.  The stress  electrocardiogram showed sinus tachycardia, RBBB + LAHB, occasional PVC's and secondary ST-T changes anteroseptal leads, unchanged compared to resting EKG.  No ischemic changes seen on stress electrocardiogram.  2. The overall quality of the study is good.  Left ventricular cavity is noted to be normal on the rest and stress studies.  Gated SPECT images reveal normal myocardial thickening and wall motion.  The left ventricular ejection fraction was calculated or visually estimated to be 88%. SPECT rest and stress images reveal decreased tracer uptake in apical, inferior myocardium, likely due to diaphragmatic attenuation.  3. Low risk study.   Event Monitor for 30 days Start date 08/05/2018: Indications syncope and collapse No symptoms reported.  Maximum heart rate 124 bpm, minimum heart rate 58 bpm, sinus rhythm.  Occasional PACs. No heart block.  Chest x-ray 12/14/2017: Heart size mildly enlarged, pulmonary artery enlargement unchanged from prior study dated 03/20/05/2017, findings consistent with pulmonary artery hypertension.  Ultrasound of the thyroid 06/25/2018: Bilateral thyroid nodules.  Being followed by endocrinology.  Carotid artery duplex 06/19/2018: Minimal heterogeneous calcific plaque but no significant stenosis. No significant change from  Carotid artery duplex 03/18/2014, 07/26/2010.  Assessment     ICD-10-CM   1. Syncope, unspecified syncope type  R55 EKG 12-Lead  2. Bifascicular block  I45.2   3. Nonrheumatic pulmonary valve stenosis  I37.0   4. Essential hypertension  I10 EKG 12-Lead    EKG 04/01/2019: Normal sinus rhythm at rate of 85 bpm, biatrial enlargement, left axis deviation, left anterior fascicular block.  Right bundle branch block.  trifascicular block. No significant change from  EKG 08/02/2018, EKG 02/24/2017.  Recommendations:  No orders of the defined types were placed in this encounter.   Patient nontraumatic mild pulmonary valve stenosis, hypertension,  Bifascicular block on the EKG, presenting for follow-up of syncope that occurred in March 2020.  He has not had any recurrence.  His cardiac work-up was unyielding.  Syncope is now resolved, will continue to watch, he does have high degree conduction system disease, and will continue to follow this clinically.  No indication for pacemaker.  Blood pressure is minimally elevated today but usually very well controlled.  No changes were done with regard to this.  Mild pulmonary valve stenosis unchanged physical exam.  Continue observation.  No need for endocarditis prophylaxis.  He is contemplating back surgery.  I have discussed with him regarding weight loss which will help both for hypertension control, improvement in arthritis and overall wellbeing.  I will see him back on an annual basis.  Adrian Prows, MD, [facility] 04/01/2019, 9:38 AM [facility] [place] Pager: [phone] Office: [phone] If no answer Cell [phone]

## 2019-04-08 DIAGNOSIS — M48061 Spinal stenosis, lumbar region without neurogenic claudication: Secondary | ICD-10-CM | POA: Insufficient documentation

## 2019-04-09 ENCOUNTER — Other Ambulatory Visit: Payer: Self-pay

## 2019-04-09 ENCOUNTER — Ambulatory Visit
Admission: RE | Admit: 2019-04-09 | Discharge: 2019-04-09 | Disposition: A | Discharge status: Home / Self Care | Payer: Medicare HMO | Source: Ambulatory Visit | Attending: Urology | Admitting: Urology

## 2019-04-09 DIAGNOSIS — C61 Malignant neoplasm of prostate: Secondary | ICD-10-CM

## 2019-05-06 ENCOUNTER — Other Ambulatory Visit: Payer: Self-pay

## 2019-05-06 ENCOUNTER — Ambulatory Visit
Admission: RE | Admit: 2019-05-06 | Discharge: 2019-05-06 | Disposition: A | Discharge status: Home / Self Care | Payer: Medicare HMO | Source: Ambulatory Visit | Attending: Urology | Admitting: Urology

## 2019-05-06 MED ORDER — GADOBENATE DIMEGLUMINE 529 MG/ML IV SOLN
20.0000 mL | Freq: Once | INTRAVENOUS | Status: AC | PRN
  Administered 2019-05-06: 16:00:00 20 mL via INTRAVENOUS

## 2019-05-07 ENCOUNTER — Other Ambulatory Visit: Payer: Self-pay | Admitting: Internal Medicine

## 2019-05-07 MED ORDER — FLUTICASONE PROPIONATE 50 MCG/ACT NA SUSP: NASAL | 12 refills | Status: DC

## 2019-05-14 ENCOUNTER — Other Ambulatory Visit: Payer: Medicare HMO

## 2019-05-17 HISTORY — PX: BACK SURGERY: SHX140

## 2019-05-29 ENCOUNTER — Other Ambulatory Visit: Payer: Self-pay | Admitting: Internal Medicine

## 2019-05-29 MED ORDER — FLUTICASONE PROPIONATE 50 MCG/ACT NA SUSP: NASAL | 4 refills | Status: DC

## 2019-06-29 ENCOUNTER — Ambulatory Visit: Payer: Medicare HMO | Attending: Internal Medicine

## 2019-06-29 DIAGNOSIS — Z23 Encounter for immunization: Secondary | ICD-10-CM | POA: Insufficient documentation

## 2019-06-29 NOTE — Progress Notes (Signed)
   Covid-19 Vaccination Clinic  Name:  Eugene [name]    MRN: [id number] DOB: Mar 12, 1946  06/29/2019  [name] was observed post Covid-19 immunization for 15 minutes without incidence. He was provided with Vaccine Information Sheet and instruction to access the V-Safe system.   [name] was instructed to call 911 with any severe reactions post vaccine: Marland Kitchen Difficulty breathing  . Swelling of your face and throat  . A fast heartbeat  . A bad rash all over your body  . Dizziness and weakness    Immunizations Administered    Name Date Dose VIS Date Route   Pfizer COVID-19 Vaccine 06/29/2019  2:24 PM 0.3 mL 04/26/2019 Intramuscular   Manufacturer: [place]   Lot: [id number]   [place]: [id number]

## 2019-07-03 ENCOUNTER — Other Ambulatory Visit (HOSPITAL_COMMUNITY): Payer: Self-pay | Admitting: Urology

## 2019-07-03 ENCOUNTER — Other Ambulatory Visit: Payer: Self-pay | Admitting: Urology

## 2019-07-03 DIAGNOSIS — C61 Malignant neoplasm of prostate: Secondary | ICD-10-CM

## 2019-07-17 ENCOUNTER — Ambulatory Visit (HOSPITAL_COMMUNITY)
Admission: RE | Admit: 2019-07-17 | Discharge: 2019-07-17 | Disposition: A | Discharge status: Home / Self Care | Payer: Medicare HMO | Source: Ambulatory Visit | Attending: Urology | Admitting: Urology

## 2019-07-17 ENCOUNTER — Other Ambulatory Visit: Payer: Self-pay

## 2019-07-17 DIAGNOSIS — C61 Malignant neoplasm of prostate: Secondary | ICD-10-CM | POA: Diagnosis present

## 2019-07-17 MED ORDER — FLUDEOXYGLUCOSE F - 18 (FDG) INJECTION
20.3000 | Freq: Once | INTRAVENOUS | Status: AC | PRN
  Administered 2019-07-17: 20.3 via INTRAVENOUS

## 2019-07-18 ENCOUNTER — Encounter: Payer: Self-pay | Admitting: Radiation Oncology

## 2019-07-18 DIAGNOSIS — N183 Chronic kidney disease, stage 3 unspecified: Secondary | ICD-10-CM | POA: Insufficient documentation

## 2019-07-18 DIAGNOSIS — I2721 Secondary pulmonary arterial hypertension: Secondary | ICD-10-CM | POA: Insufficient documentation

## 2019-07-18 DIAGNOSIS — I071 Rheumatic tricuspid insufficiency: Secondary | ICD-10-CM | POA: Insufficient documentation

## 2019-07-18 NOTE — Progress Notes (Addendum)
GU Location of Tumor / Histology: prostatic adenocarcinoma  If Prostate Cancer, Gleason Score is (4 + 5) and PSA is (12.5) on 02/19/2019. Prostate volume: 26 grams  Eugene [name] is a previous patient of Dr. Pilar Jarvis. He underwent a prostate biopsy on 06/12/2017 for a PSA of 5.7. 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 He elected for active surveillance. PSA increased to 12 thus an MRI of the prostate was done.   Biopsies of prostate (if applicable) revealed:    Past/Anticipated interventions by urology, if any: prostate biopsy, active surveillance, prostate biopsy, CT abd/pelvis negative, bone scan negative, referral for consideration of external beam radiation therapy   Past/Anticipated interventions by medical oncology, if any: no  Weight changes, if any: denies  Bowel/Bladder complaints, if any: IPSS 2. SHIM 2.  Denies dysuria or hematuria. Reports itching and burning on the surface of his scrotum despite using Lotrisone cream. Denies dysuria or hematuria. Reports post void dribble. Reports a weak urine stream. Reports occasional low abdominal discomfort with voiding. Reports taking tamsulosin once per day.  Nausea/Vomiting, if any: denies  Pain issues, if any:  Patient had recent back surgery in November and continues to recover from that. Also, patient has leg spasms and neuropathy associated with back surgery.  SAFETY ISSUES:  Prior radiation? denies  Pacemaker/ICD? Denies.  Possible current pregnancy? no, male patient  Is the patient on methotrexate? no  Current Complaints / other details:  74 year old male.  Married with 3 daughters. Retired. 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  Oldest brother died of melanoma. Patient denies knowledge of other family members with cancer.

## 2019-07-19 ENCOUNTER — Encounter: Payer: Self-pay | Admitting: Radiation Oncology

## 2019-07-19 ENCOUNTER — Other Ambulatory Visit: Payer: Self-pay

## 2019-07-19 ENCOUNTER — Encounter: Payer: Self-pay | Admitting: Urology

## 2019-07-19 ENCOUNTER — Ambulatory Visit
Admission: RE | Admit: 2019-07-19 | Discharge: 2019-07-19 | Disposition: A | Discharge status: Home / Self Care | Payer: Medicare HMO | Source: Ambulatory Visit | Attending: Radiation Oncology | Admitting: Radiation Oncology

## 2019-07-19 VITALS — Ht 69.0 in | Wt 266.0 lb

## 2019-07-19 DIAGNOSIS — C61 Malignant neoplasm of prostate: Secondary | ICD-10-CM

## 2019-07-19 HISTORY — DX: Malignant neoplasm of prostate: C61

## 2019-07-19 NOTE — Progress Notes (Signed)
See progress note under physician encounter. 

## 2019-07-19 NOTE — Progress Notes (Signed)
Radiation Oncology         [phone] ________________________________  Initial Outpatient Consultation - Conducted via Telephone due to current COVID-19 concerns for limiting patient exposure  Name: Eugene [name] MRN: [id number]  Date: 07/19/2019  DOB: 12-04-1945  [id number], Eugene Picket, MD  Lucas Mallow, MD   REFERRING PHYSICIAN: Lucas Mallow, MD  DIAGNOSIS: 74 y.o. gentleman with Stage T1c adenocarcinoma of the prostate with Gleason score of 4+5, and PSA of 12.5.    ICD-10-CM   1. Malignant neoplasm of prostate ([place])  C61     HISTORY OF PRESENT ILLNESS: Eugene [name] is a 74 y.o. male with a diagnosis of prostate cancer. He was initially diagnosed with Gleason 3+3 adenocarcinoma of the prostate in 1 of 12 cores on biopsy with Dr. Pilar Jarvis on 06/12/2017 with a PSA of 5.7 at that time.  They elected to proceed in active surveillance, however he did not return to urology for follow up until his PSA increased to over 12 in 02/2019.  He reports that his PSA was being monitored with his PCP.  He returned to [facility] and was evaluated by Dr. Gloriann Loan on 03/14/2019, digital rectal exam was performed at that time and was without nodularity or concerning findings.  He proceeded to prostate MRI on 05/06/2019, which had been delayed due to the patient undergoing back surgery on 04/08/2019. This showed a PI-RADS 5 lesion in the anterior mid prostate abutting the anterior fibromuscular stroma.  There was no gross extracapsular disease present but suspicious based on length of capsular contact.  There were also mildly enlarged bilateral external iliac nodes felt most likely to be benign based on comparison to CT A/P from December 2016. The patient proceeded to MRI fusion biopsy of the prostate on 06/20/2019.  The prostate volume measured 26 cc.  Out of 16 core biopsies, 4 were positive. Only the cores within the ROI MRI lesion were positive, and all 4 cores showed Gleason 4+5 disease.  The  standard 12 cores showed 4 areas of HG/PIN and the remainder were benign.  He underwent imaging for disease staging with a bone scan on 07/17/2019 showing no definitive scintigraphic evidence of osseous metastatic disease and a CT A/P with nonspecific mild central mesenteric lymphadenopathy felt most likely to be reactive lymphadenopathy and highly unlikely to be prostate cancer metastases.  There were no additional findings suspicious for metastatic disease.  The patient reviewed the biopsy results with his urologist and he has kindly been referred today for discussion of potential radiation treatment options.   PREVIOUS RADIATION THERAPY: No  PAST MEDICAL HISTORY:  Past Medical History:  Diagnosis Date  . Allergic rhinitis due to pollen   . Allergy    seasonal  . Anxiety   . Blood transfusion without reported diagnosis    do not except blood transfusions.  . Cataract    beginning stage  . DDD (degenerative disc disease), cervical    Severe w/ osteophytes on xray 3/16  . ED (erectile dysfunction)   . Fainting    07/26/18  . GERD (gastroesophageal reflux disease)   . Heart murmur   . History of Holter monitoring    started on march 19,2020  . Hypertension   . OBESITY   . Osteoarthritis   . Prostate cancer ([employer])   . Pulmonary stenosis, valvar    TEE 07/2011: stable, LVEF 55%, small PFO  . Refusal of blood transfusions as patient is Jehovah's Witness   . Rheumatic fever  residual pulm stenosis, follows with ganji every 40mo  . RHINITIS, CHRONIC   . Sleep apnea    wear mouth guard  . SLEEP APNEA, OBSTRUCTIVE dx 2000   NPSG 2000:  AHI 76/hr CPAP titrated to 11cm 2002       PAST SURGICAL HISTORY: Past Surgical History:  Procedure Laterality Date  . COLONOSCOPY    . PROSTATE BIOPSY    . ROTATOR CUFF REPAIR     2018  . TEE WITHOUT CARDIOVERSION  08/02/2011   Procedure: TRANSESOPHAGEAL ECHOCARDIOGRAM (TEE);  Surgeon: Laverda Page, MD;  Location: [facility] ENDOSCOPY;  Service:  Cardiovascular;  Laterality: N/A;    FAMILY HISTORY:  Family History  Problem Relation Age of Onset  . Heart disease Mother   . Hypertension Other   . Melanoma Brother   . Heart disease Brother   . Colon cancer Neg Hx   . Stomach cancer Neg Hx   . Rectal cancer Neg Hx   . Esophageal cancer Neg Hx   . Prostate cancer Neg Hx   . Pancreatic cancer Neg Hx     SOCIAL HISTORY:  Social History   Socioeconomic History  . Marital status: Married    Spouse name: Not on file  . Number of children: 3  . Years of education: Not on file  . Highest education level: Not on file  Occupational History  . Occupation: retired.      Comment: prev worked as Therapist, music for [employer]  . Smoking status: Never Smoker  . Smokeless tobacco: Never Used  Substance and Sexual Activity  . Alcohol use: Yes    Alcohol/week: 0.0 standard drinks    Comment: on weekends liquor  . Drug use: No  . Sexual activity: Yes  Other Topics Concern  . Not on file  Social History Narrative  . Not on file   Social Determinants of Health   Financial Resource Strain:   . Difficulty of Paying Living Expenses: Not on file  Food Insecurity:   . Worried About Charity fundraiser in the Last Year: Not on file  . Ran Out of Food in the Last Year: Not on file  Transportation Needs:   . Lack of Transportation (Medical): Not on file  . Lack of Transportation (Non-Medical): Not on file  Physical Activity:   . Days of Exercise per Week: Not on file  . Minutes of Exercise per Session: Not on file  Stress:   . Feeling of Stress : Not on file  Social Connections:   . Frequency of Communication with Friends and Family: Not on file  . Frequency of Social Gatherings with Friends and Family: Not on file  . Attends Religious Services: Not on file  . Active Member of Clubs or Organizations: Not on file  . Attends Archivist Meetings: Not on file  . Marital Status: Not on file    Intimate Partner Violence:   . Fear of Current or Ex-Partner: Not on file  . Emotionally Abused: Not on file  . Physically Abused: Not on file  . Sexually Abused: Not on file    ALLERGIES: Other and Penicillins  MEDICATIONS:  Current Outpatient Medications  Medication Sig Dispense Refill  . acetaminophen (TYLENOL) 325 MG tablet Take by mouth. Acetaminophen Arthritis.    Marland Kitchen allopurinol (ZYLOPRIM) 100 MG tablet Take 1 tablet by mouth daily.    Marland Kitchen amLODipine (NORVASC) 10 MG tablet Take 10 mg by mouth daily.    Marland Kitchen  azelastine (ASTELIN) 0.1 % nasal spray Place into the nose.    . carvedilol (COREG) 25 MG tablet Take 1 tablet (25 mg total) by mouth 2 (two) times daily with a meal. 180 tablet 1  . clotrimazole-betamethasone (LOTRISONE) cream     . fluticasone (FLONASE) 50 MCG/ACT nasal spray 2 sprays by Each Nare route daily as needed for Rhinitis.    Marland Kitchen gabapentin (NEURONTIN) 100 MG capsule     . loratadine (CLARITIN) 10 MG tablet Take 10 mg by mouth daily as needed for allergies.    Marland Kitchen losartan-hydrochlorothiazide (HYZAAR) 50-12.5 MG per tablet Take 1 tablet by mouth 2 (two) times daily. 180 tablet 1  . Multiple Vitamins-Minerals (CENTRUM SILVER PO) Take by mouth.    . polyethylene glycol (MIRALAX / GLYCOLAX) 17 g packet Take 17 g by mouth daily.    . tamsulosin (FLOMAX) 0.4 MG CAPS capsule     . Turmeric (QC TUMERIC COMPLEX PO) Take by mouth. Pt reports taking Tumeric root     No current facility-administered medications for this encounter.    REVIEW OF SYSTEMS:  On review of systems, the patient reports that he is doing well overall. He denies any chest pain, shortness of breath, cough, fevers, chills, night sweats, unintended weight changes. He denies any bowel disturbances, and denies abdominal pain, nausea or vomiting. He denies any new musculoskeletal or joint aches or pains. He continues to recover from back surgery in 03/2019. His IPSS was 2, indicating mild urinary symptoms. He reports  post-void dribble, weak urine stream, and occasional low abdominal discomfort with voiding. He endorses taking tamsulosin. He also notes itching and burning to the surface of his scrotum despite using Lotrisone cream. His SHIM was 2, indicating he has severe erectile dysfunction. A complete review of systems is obtained and is otherwise negative.    PHYSICAL EXAM:  Wt Readings from Last 3 Encounters:  07/19/19 266 lb (120.7 kg)  04/01/19 271 lb 4.8 oz (123.1 kg)  11/06/18 270 lb 12.8 oz (122.8 kg)   Temp Readings from Last 3 Encounters:  11/06/18 98.4 F (36.9 C) (Oral)  08/06/18 (!) 97.4 F (36.3 C) (Temporal)  12/14/17 98.9 F (37.2 C) (Oral)   BP Readings from Last 3 Encounters:  04/01/19 140/80  11/06/18 130/62  12/14/17 124/74   Pulse Readings from Last 3 Encounters:  04/01/19 97  11/06/18 83  12/14/17 97   Pain Assessment Pain Score: 0-No pain/10  Physical exam not performed in light of telephone consult visit format.   KPS = 90  100 - Normal; no complaints; no evidence of disease. 90   - Able to carry on normal activity; minor signs or symptoms of disease. 80   - Normal activity with effort; some signs or symptoms of disease. 82   - Cares for self; unable to carry on normal activity or to do active work. 60   - Requires occasional assistance, but is able to care for most of his personal needs. 50   - Requires considerable assistance and frequent medical care. 85   - Disabled; requires special care and assistance. 73   - Severely disabled; hospital admission is indicated although death not imminent. 73   - Very sick; hospital admission necessary; active supportive treatment necessary. 10   - Moribund; fatal processes progressing rapidly. 0     - Dead  Karnofsky DA, Abelmann WH, Craver LS and Burchenal [facility] [phone]) The use of the nitrogen mustards in the palliative treatment of carcinoma: with  particular reference to bronchogenic carcinoma Cancer 1  634-56  LABORATORY DATA:  Lab Results  Component Value Date   WBC 14.2 (H) 09/05/2018   HGB 12.6 (L) 09/05/2018   HCT 37.8 09/05/2018   MCV 98 (H) 09/05/2018   PLT 187 09/05/2018   Lab Results  Component Value Date   NA 142 09/05/2018   K 4.1 09/05/2018   CL 103 09/05/2018   CO2 26 09/05/2018   Lab Results  Component Value Date   ALT 22 09/05/2018   AST 26 09/05/2018   ALKPHOS 86 09/05/2018   BILITOT 0.9 09/05/2018     RADIOGRAPHY: NM Bone Scan Whole Body  Result Date: 07/17/2019 CLINICAL DATA:  Prostate cancer, new diagnosis, PSA 12.5 on 02/19/2019, back pain, had back surgery November 2020 EXAM: NUCLEAR MEDICINE WHOLE BODY BONE SCAN TECHNIQUE: Whole body anterior and posterior images were obtained approximately 3 hours after intravenous injection of radiopharmaceutical. RADIOPHARMACEUTICALS:  20.3 mCi Technetium-72m MDP IV COMPARISON:  None Correlation: CT abdomen and pelvis 07/17/2019 FINDINGS: Uptake at shoulders, sternoclavicular joints, wrists, knees, mildly at hips, typically degenerative. Uptake at the lateral lower RIGHT rib, approximately RIGHT ninth, question old healed fracture by CT sagittal image 43. No definite sites of osseous metastatic disease are identified. Expected urinary tract and soft tissue distribution of tracer. IMPRESSION: No definite scintigraphic evidence of osseous metastatic disease. Electronically Signed   By: Lavonia Dana M.D.   On: 07/17/2019 20:06      IMPRESSION/PLAN: This visit was conducted via Telephone to spare the patient unnecessary potential exposure in the healthcare setting during the current COVID-19 pandemic. 1. 74 y.o. gentleman with Stage T1c adenocarcinoma of the prostate with Gleason Score of 4+5, and PSA of 12.5. We discussed the patient's workup and outlined the nature of prostate cancer in this setting. The patient's T stage, Gleason's score, and PSA put him into the high risk group. Accordingly, he is eligible for a variety of  potential treatment options including prostatectomy, or LT-ADT concurrent with either 8 weeks of external radiation or 5 weeks of external radiation preceded by a brachytherapy boost. We discussed the available radiation techniques, and focused on the details and logistics of delivery. We discussed and outlined the risks, benefits, short and long-term effects associated with radiotherapy and compared and contrasted these with prostatectomy. We discussed the role of SpaceOAR in reducing the rectal toxicity associated with radiotherapy. We also detailed the role of ADT in the treatment of high risk prostate cancer and outlined the associated side effects that could be expected with this therapy.  He was encouraged to ask questions which were answered to his stated satisfaction.  At the end of the conversation the patient remains undecided regarding his treatment preference and would like to take more time to consider his options. He requested some educational materials regarding radiation treatments and will be provided with a brachytherapy brochure, which he will pick up from [place] next week along with a web link for [email or web address] He is also scheduled to follow up with Dr. Gloriann Loan on 08/01/2019 for further discussion of treatment options and will likely start ADT at that time. We will share our discussion with Dr. Gloriann Loan and look forward to hearing from the patient in the near future and will proceed with treatment planning accordingly at that time.  Given current concerns for patient exposure during the COVID-19 pandemic, this encounter was conducted via telephone. The patient was notified in advance and was offered a MyChart meeting to allow for face  to face communication but unfortunately reported that he did not have the appropriate resources/technology to support such a visit and instead preferred to proceed with telephone consult. The patient has given verbal consent for this type of encounter. The time spent  during this encounter was 75 minutes. The attendants for this meeting include Tyler Pita MD, Ashlyn Bruning PA-C, [place], and patient, Eugene [name]. During the encounter, Tyler Pita MD, Ashlyn Bruning PA-C, and scribe, Wilburn Mylar were located at [place].  Patient, Eugene [name] was located at home.    Nicholos Johns, PA-C    Tyler Pita, MD  [facility] Direct Dial: [phone]  Fax: [phone] [email or web address]  Skype  [email or web address]  This document serves as a record of services personally performed by Tyler Pita, MD and Freeman Caldron, PA-C. It was created on their behalf by Wilburn Mylar, a trained medical scribe. The creation of this record is based on the scribe's personal observations and the provider's statements to them. This document has been checked and approved by the attending provider.

## 2019-07-22 ENCOUNTER — Ambulatory Visit: Payer: Medicare HMO | Attending: Internal Medicine

## 2019-07-22 DIAGNOSIS — Z23 Encounter for immunization: Secondary | ICD-10-CM | POA: Insufficient documentation

## 2019-07-22 NOTE — Progress Notes (Signed)
   Covid-19 Vaccination Clinic  Name:  Eugene [name]    MRN: [id number] DOB: [birth year]  07/22/2019  [name] was observed post Covid-19 immunization for 15 minutes without incident. He was provided with Vaccine Information Sheet and instruction to access the V-Safe system.   [name] was instructed to call 911 with any severe reactions post vaccine: Marland Kitchen Difficulty breathing  . Swelling of face and throat  . A fast heartbeat  . A bad rash all over body  . Dizziness and weakness   Immunizations Administered    Name Date Dose VIS Date Route   Pfizer COVID-19 Vaccine 07/22/2019 11:50 AM 0.3 mL 04/26/2019 Intramuscular   Manufacturer: [place]   Lot: [id number]   [place]: [id number]

## 2019-07-24 ENCOUNTER — Telehealth: Payer: Self-pay | Admitting: Urology

## 2019-07-24 NOTE — Telephone Encounter (Signed)
Just FYI- I spoke with Mr. Scorza by phone this morning to confirm his decision to move forward with LT-ADT concurrent with 8 weeks of daily prostate IMRT which he has confirmed.  He was able to review the brachytherapy brochure that we provided him as well as the educational video on daily radiation and feels confident in his decision.  I advised that I would share this information with his team and move forward with treatment planning accordingly.  He has a follow up appointment with Dr. Gloriann Loan on 08/01/19 and hopes to be able to start ADT at that time so that we will be ready to start his daily radiation in mid-May.  He will need fiducial markers and SpaceOAR gel prior to CT SIM in preparation for his daily radiation treatments and understands that Romie Jumper will be working with Dr. Purvis Sheffield surgical schedulers to coordinate this procedure, likely in early May.  Nicholos Johns, MMS, PA-C [place] [facility] [place]: [phone]  Fax: [phone]

## 2019-07-25 ENCOUNTER — Telehealth: Payer: Self-pay | Admitting: Medical Oncology

## 2019-07-25 NOTE — Telephone Encounter (Signed)
Spoke with patient to introduce myself as the prostate nurse navigator and discuss my role. I was unable to meet him 3/5, when he consulted with Dr. Tammi Klippel. He has chosen ADT with 8 weeks of radiation. He was interested in brachytherapy but after reading the educational materials, he did not want to have surgery. He has a follow up appointment with Dr. Gloriann Loan 3/18 and hopes to get ADT. I gave him my contact information and asked him to call me with questions or concerns. He voiced understanding.

## 2019-08-13 ENCOUNTER — Telehealth: Payer: Self-pay | Admitting: *Deleted

## 2019-08-13 NOTE — Telephone Encounter (Signed)
CALLED PATIENT TO INFORM OF FID. MARKERS AND SPACE OAR PLACEMENT ON 10-01-19 - ARRIVAL TIME- 3 PM @ [facility] AND HIS SIM ON 10-04-19 - ARRIVAL TIME- 9:45 AM @ [facility], SPOKE WITH PATIENT AND HE IS AWARE OF THESE APPTS.

## 2019-08-23 ENCOUNTER — Other Ambulatory Visit: Payer: Self-pay | Admitting: Urology

## 2019-08-23 ENCOUNTER — Encounter: Payer: Self-pay | Admitting: Urology

## 2019-08-23 DIAGNOSIS — C61 Malignant neoplasm of prostate: Secondary | ICD-10-CM

## 2019-08-23 NOTE — Progress Notes (Signed)
Patient started Eugene [name] 08/06/19 and is scheduled for fiducial markers and SpaceOAR on 10/01/19 and CT [facility] 10/04/19.  Nicholos Johns, MMS, PA-C [place] [facility] [place]: [phone]  Fax: [phone]

## 2019-08-29 ENCOUNTER — Encounter: Payer: Self-pay | Admitting: Medical Oncology

## 2019-08-29 NOTE — Progress Notes (Signed)
Patient called asking if it is normal to have "hot flashes". He received his ADT 3/23, and has started experiencing hot flashes. I explained this is a normal side effect of the  hormone  injection. I discussed the common side effects of ADT. He states he had 2, large hard knots at the injection sites of [place]. I asked if they were improving and he said there are still 2 small knots, but they are getting better every day. He will have another hormone injection 4/27, gold markers/SpaceOar gel 5/18 and CT simulation 5/21. He voiced confirmation of the appointments. I asked him to call me back if he had further questions or concerns. He voiced understanding.

## 2019-10-02 ENCOUNTER — Telehealth: Payer: Self-pay | Admitting: *Deleted

## 2019-10-02 NOTE — Telephone Encounter (Signed)
CALLED PATIENT TO INFORM THAT SIM AND MRI ARE SCHEDULED FOR 10-18-19, SPOKE WITH PATIENT AND HE IS AWARE OF THESE APPTS.

## 2019-10-03 ENCOUNTER — Encounter: Payer: Self-pay | Admitting: Medical Oncology

## 2019-10-03 NOTE — Progress Notes (Signed)
Patient called with questions regarding appointments. We confirmed appointments for CT simulation and MRI. He received his gold markers and SpaceOar 5/18.

## 2019-10-04 ENCOUNTER — Ambulatory Visit: Payer: Medicare HMO | Admitting: Radiation Oncology

## 2019-10-18 ENCOUNTER — Ambulatory Visit
Admission: RE | Admit: 2019-10-18 | Discharge: 2019-10-18 | Disposition: A | Discharge status: Home / Self Care | Payer: Medicare HMO | Source: Ambulatory Visit | Attending: Radiation Oncology | Admitting: Radiation Oncology

## 2019-10-18 ENCOUNTER — Ambulatory Visit (HOSPITAL_COMMUNITY)
Admission: RE | Admit: 2019-10-18 | Discharge: 2019-10-18 | Disposition: A | Discharge status: Home / Self Care | Payer: Medicare HMO | Source: Ambulatory Visit | Attending: Urology | Admitting: Urology

## 2019-10-18 ENCOUNTER — Other Ambulatory Visit: Payer: Self-pay

## 2019-10-18 ENCOUNTER — Encounter: Payer: Self-pay | Admitting: Medical Oncology

## 2019-10-18 DIAGNOSIS — C61 Malignant neoplasm of prostate: Secondary | ICD-10-CM | POA: Diagnosis not present

## 2019-10-18 DIAGNOSIS — Z510 Encounter for antineoplastic radiation therapy: Secondary | ICD-10-CM | POA: Insufficient documentation

## 2019-10-22 NOTE — Progress Notes (Signed)
  Radiation Oncology         (336) [phone] ________________________________  Name: Eugene [name] MRN: [id number]  Date: 10/18/2019  DOB: 1945-09-09  SIMULATION AND TREATMENT PLANNING NOTE    ICD-10-CM   1. Malignant neoplasm of prostate ([place])  C61     DIAGNOSIS:  74 y.o. gentleman with Stage T1c adenocarcinoma of the prostate with Gleason score of 4+5, and PSA of 12.5.  NARRATIVE:  The patient was brought to the [facility].  Identity was confirmed.  All relevant records and images related to the planned course of therapy were reviewed.  The patient freely provided informed written consent to proceed with treatment after reviewing the details related to the planned course of therapy. The consent form was witnessed and verified by the simulation staff.  Then, the patient was set-up in a stable reproducible supine position for radiation therapy.  A vacuum lock pillow device was custom fabricated to position his legs in a reproducible immobilized position.  Then, I performed a urethrogram under sterile conditions to identify the prostatic bed.  CT images were obtained.  Surface markings were placed.  The CT images were loaded into the planning software.  Then the prostate bed target, pelvic lymph node target and avoidance structures including the rectum, bladder, bowel and hips were contoured.  Treatment planning then occurred.  The radiation prescription was entered and confirmed.  A total of one complex treatment devices were fabricated. I have requested : Intensity Modulated Radiotherapy (IMRT) is medically necessary for this case for the following reason:  Rectal sparing.Marland Kitchen  PLAN:  The patient will receive 45 Gy in 25 fractions of 1.8 Gy, followed by a boost to the prostate to a total dose of 75 Gy with 15 additional fractions of 2 Gy.   ________________________________  Sheral Apley Tammi Klippel, M.D.

## 2019-10-29 DIAGNOSIS — Z510 Encounter for antineoplastic radiation therapy: Secondary | ICD-10-CM | POA: Diagnosis not present

## 2019-10-30 ENCOUNTER — Ambulatory Visit
Admission: RE | Admit: 2019-10-30 | Discharge: 2019-10-30 | Disposition: A | Discharge status: Home / Self Care | Payer: Medicare HMO | Source: Ambulatory Visit | Attending: Radiation Oncology | Admitting: Radiation Oncology

## 2019-10-30 ENCOUNTER — Other Ambulatory Visit: Payer: Self-pay

## 2019-10-30 DIAGNOSIS — Z510 Encounter for antineoplastic radiation therapy: Secondary | ICD-10-CM | POA: Diagnosis not present

## 2019-10-31 ENCOUNTER — Other Ambulatory Visit: Payer: Self-pay

## 2019-10-31 ENCOUNTER — Ambulatory Visit
Admission: RE | Admit: 2019-10-31 | Discharge: 2019-10-31 | Disposition: A | Discharge status: Home / Self Care | Payer: Medicare HMO | Source: Ambulatory Visit | Attending: Radiation Oncology | Admitting: Radiation Oncology

## 2019-10-31 ENCOUNTER — Encounter: Payer: Self-pay | Admitting: Medical Oncology

## 2019-10-31 DIAGNOSIS — Z510 Encounter for antineoplastic radiation therapy: Secondary | ICD-10-CM | POA: Diagnosis not present

## 2019-11-01 ENCOUNTER — Other Ambulatory Visit: Payer: Self-pay

## 2019-11-01 ENCOUNTER — Ambulatory Visit
Admission: RE | Admit: 2019-11-01 | Discharge: 2019-11-01 | Disposition: A | Discharge status: Home / Self Care | Payer: Medicare HMO | Source: Ambulatory Visit | Attending: Radiation Oncology | Admitting: Radiation Oncology

## 2019-11-01 ENCOUNTER — Inpatient Hospital Stay: Payer: Medicare HMO | Attending: Radiation Oncology | Admitting: Nutrition

## 2019-11-01 DIAGNOSIS — Z510 Encounter for antineoplastic radiation therapy: Secondary | ICD-10-CM | POA: Diagnosis not present

## 2019-11-01 NOTE — Progress Notes (Signed)
Patient is a 74 year old male diagnosed with prostate cancer receiving radiation therapy and followed by Dr. Tammi Klippel.  Past medical history includes sleep apnea, obesity, hypertension, GERD, and anxiety.  Medications include multivitamin, turmeric, and MiraLAX.  Labs were reviewed.  Height: 69 inches. Weight: 266 pounds. BMI: 39.28.  Patient is interested in information on a healthy diet.  Nutrition diagnosis: Food and nutrition knowledge deficit related to prostate cancer and associated treatments as evidenced by no prior need for nutrition related information  Intervention: Educated patient on plant-based diet focusing on lean protein sources, increased vegetables, especially cruciferous and whole fruits.  Encouraged patient to bake or grill or boil meats instead of frying. Reviewed general recommendations for patients with prostate cancer. Provided fact sheets.  Questions were answered.  Teach back method used.  Contact information provided.  Monitoring, evaluation, goals: Patient will tolerate a healthy plant-based diet to minimize risk for recurrence.  No follow-up is scheduled.  Nutrition diagnosis resolved.  **Disclaimer: This note was dictated with voice recognition software. Similar sounding words can inadvertently be transcribed and this note may contain transcription errors which may not have been corrected upon publication of note.**

## 2019-11-04 ENCOUNTER — Other Ambulatory Visit: Payer: Self-pay

## 2019-11-04 ENCOUNTER — Ambulatory Visit
Admission: RE | Admit: 2019-11-04 | Discharge: 2019-11-04 | Disposition: A | Discharge status: Home / Self Care | Payer: Medicare HMO | Source: Ambulatory Visit | Attending: Radiation Oncology | Admitting: Radiation Oncology

## 2019-11-04 DIAGNOSIS — Z510 Encounter for antineoplastic radiation therapy: Secondary | ICD-10-CM | POA: Diagnosis not present

## 2019-11-05 ENCOUNTER — Other Ambulatory Visit: Payer: Self-pay

## 2019-11-05 ENCOUNTER — Ambulatory Visit
Admission: RE | Admit: 2019-11-05 | Discharge: 2019-11-05 | Disposition: A | Discharge status: Home / Self Care | Payer: Medicare HMO | Source: Ambulatory Visit | Attending: Radiation Oncology | Admitting: Radiation Oncology

## 2019-11-05 DIAGNOSIS — Z510 Encounter for antineoplastic radiation therapy: Secondary | ICD-10-CM | POA: Diagnosis not present

## 2019-11-06 ENCOUNTER — Other Ambulatory Visit: Payer: Self-pay

## 2019-11-06 ENCOUNTER — Ambulatory Visit
Admission: RE | Admit: 2019-11-06 | Discharge: 2019-11-06 | Disposition: A | Discharge status: Home / Self Care | Payer: Medicare HMO | Source: Ambulatory Visit | Attending: Radiation Oncology | Admitting: Radiation Oncology

## 2019-11-06 DIAGNOSIS — Z510 Encounter for antineoplastic radiation therapy: Secondary | ICD-10-CM | POA: Diagnosis not present

## 2019-11-07 ENCOUNTER — Other Ambulatory Visit: Payer: Self-pay

## 2019-11-07 ENCOUNTER — Ambulatory Visit
Admission: RE | Admit: 2019-11-07 | Discharge: 2019-11-07 | Disposition: A | Discharge status: Home / Self Care | Payer: Medicare HMO | Source: Ambulatory Visit | Attending: Radiation Oncology | Admitting: Radiation Oncology

## 2019-11-07 DIAGNOSIS — Z510 Encounter for antineoplastic radiation therapy: Secondary | ICD-10-CM | POA: Diagnosis not present

## 2019-11-08 ENCOUNTER — Ambulatory Visit
Admission: RE | Admit: 2019-11-08 | Discharge: 2019-11-08 | Disposition: A | Discharge status: Home / Self Care | Payer: Medicare HMO | Source: Ambulatory Visit | Attending: Radiation Oncology | Admitting: Radiation Oncology

## 2019-11-08 ENCOUNTER — Encounter: Payer: Self-pay | Admitting: Medical Oncology

## 2019-11-08 ENCOUNTER — Other Ambulatory Visit: Payer: Self-pay

## 2019-11-08 DIAGNOSIS — Z510 Encounter for antineoplastic radiation therapy: Secondary | ICD-10-CM | POA: Diagnosis not present

## 2019-11-11 ENCOUNTER — Other Ambulatory Visit: Payer: Self-pay

## 2019-11-11 ENCOUNTER — Encounter: Payer: Self-pay | Admitting: Internal Medicine

## 2019-11-11 ENCOUNTER — Ambulatory Visit: Payer: Medicare HMO | Admitting: Internal Medicine

## 2019-11-11 ENCOUNTER — Ambulatory Visit
Admission: RE | Admit: 2019-11-11 | Discharge: 2019-11-11 | Disposition: A | Discharge status: Home / Self Care | Payer: Medicare HMO | Source: Ambulatory Visit | Attending: Radiation Oncology | Admitting: Radiation Oncology

## 2019-11-11 DIAGNOSIS — G4733 Obstructive sleep apnea (adult) (pediatric): Secondary | ICD-10-CM

## 2019-11-11 DIAGNOSIS — C61 Malignant neoplasm of prostate: Secondary | ICD-10-CM

## 2019-11-11 DIAGNOSIS — J302 Other seasonal allergic rhinitis: Secondary | ICD-10-CM | POA: Diagnosis not present

## 2019-11-11 DIAGNOSIS — J3089 Other allergic rhinitis: Secondary | ICD-10-CM | POA: Diagnosis not present

## 2019-11-11 DIAGNOSIS — Z510 Encounter for antineoplastic radiation therapy: Secondary | ICD-10-CM | POA: Diagnosis not present

## 2019-11-11 MED ORDER — AZELASTINE HCL 0.1 % NA SOLN: NASAL | 4 refills | Status: AC

## 2019-11-11 NOTE — Progress Notes (Signed)
HPI male never smoker followed for Allergic rhinitis, OSA, history chronic sinusitis, HBP, RBBB/LAHB, Hx RF heart disease, Pulmonary Valve Stenosis NPSG 2000:  AHI 76/hr  Unattended Home Sleep Test-11/09/2015-moderate obstructive sleep apnea-AHI 24.4/hour, desaturation to 82%, body weight 257 pounds  ------------------------------------------------------------------------   11/06/2018- 72 yoM never smoker followed for Allergic rhinitis, OSA, history chronic sinusitis, HBP, RBBB/LAHB, Hx RF heart disease, Pulmonary Valve Stenosis CPAP auto/[employer]-noncompliant    -----OSA - oral appliance w/ Dr. Ron Parker, pt states breathing is at baseline; needs refills of Astelin & Flonase nasal sprays Body weight today 270 lbs He thinks oral appliance helps his sleep- says wife reports "not much" snore. Spring allergic rhinitis season managed with Astelin and Flonase- needs refills. Not asthmatic.  Cardiology continues to follow.   11/11/19- 73 yoM never smoker followed for Allergic rhinitis, OSA (failed CPAP), history chronic sinusitis, HBP, RBBB/LAHB, Hx RF heart disease, Pulmonary Valve Stenosis CPAP auto/[employer]-noncompliant> oral appliance/ Dr Ron Parker, Prostate cancer, Had 2 Phizer Covax. He indicates he prefers oral appliance to CPAP, but says he and Dr Ron Parker are having to accept imperfect control as "the best we can get".  Allergic rhinitis occasionally interferes. He controls rhinitis, asks refill astelin spray.  Denies recent cardiac events- feels stable.   ROS-see HPI + = positive Constitutional:   No-   weight loss, night sweats, fevers, chills, fatigue, lassitude. HEENT:   No-  headaches, difficulty swallowing, tooth/dental problems, sore throat,       No- sneezing, itching, ear ache, +nasal congestion, no-post nasal drip,  CV:  No-   chest pain, orthopnea, PND, swelling in lower extremities, anasarca, dizziness, palpitations Resp: No-   shortness of breath with exertion or at rest.           +   productive cough,  No non-productive cough,  + coughing up of blood.              No-   change in color of mucus.  No- wheezing.   Skin: No-   rash or lesions. GI:  No-   heartburn, indigestion, abdominal pain, nausea, vomiting,  GU:   MS:  No-   joint pain or swelling.   Neuro-     nothing unusual Psych:  No- change in mood or affect. No depression or anxiety.  No memory loss.  OBJ- Physical Exam General- Alert, Oriented, Affect-appropriate, Distress- none acute, + obese.  Skin- rash-none, lesions- none, excoriation- none Lymphadenopathy- none Head- atraumatic            Eyes- Gross vision intact, PERRLA, conjunctivae and secretions clear,               + periorbital edema            Ears- Hearing, canals-normal            Nose-  +turbinate edema, +mild septal dev, No- mucus, polyps, erosion, perforation.             Throat- Mallampati III-IV , mucosa-clear , drainage- none, tonsils- atrophic Neck- flexible , trachea midline, no stridor , thyroid nl, carotid no bruit Chest - symmetrical excursion , unlabored           Heart/CV- RRR , murmur+ syst 1/6 , no gallop  , no rub, nl s1 s2                           - JVD- none , edema- none, stasis changes- none, varices- none  Lung-+ clear no-wheeze, cough- none , dullness-none, rub- none           Chest wall-  Abd-  Br/ Gen/ Rectal- Not done, not indicated Extrem- cyanosis- none, clubbing, none, atrophy- none, strength- nl Neuro- grossly intact to observation.

## 2019-11-11 NOTE — Patient Instructions (Signed)
Refill for astelin nasal spray sent to mail order  Ok to continue flonase and an antihistamine like claritin if needed for allergies  Dr Ron Parker is managing your sleep apnea now, so we will be happy to see you again in the future if you need [place] for anything we can help with.

## 2019-11-12 ENCOUNTER — Other Ambulatory Visit: Payer: Self-pay

## 2019-11-12 ENCOUNTER — Ambulatory Visit
Admission: RE | Admit: 2019-11-12 | Discharge: 2019-11-12 | Disposition: A | Discharge status: Home / Self Care | Payer: Medicare HMO | Source: Ambulatory Visit | Attending: Radiation Oncology | Admitting: Radiation Oncology

## 2019-11-12 DIAGNOSIS — Z510 Encounter for antineoplastic radiation therapy: Secondary | ICD-10-CM | POA: Diagnosis not present

## 2019-11-13 ENCOUNTER — Ambulatory Visit
Admission: RE | Admit: 2019-11-13 | Discharge: 2019-11-13 | Disposition: A | Discharge status: Home / Self Care | Payer: Medicare HMO | Source: Ambulatory Visit | Attending: Radiation Oncology | Admitting: Radiation Oncology

## 2019-11-13 ENCOUNTER — Other Ambulatory Visit: Payer: Self-pay

## 2019-11-13 DIAGNOSIS — Z510 Encounter for antineoplastic radiation therapy: Secondary | ICD-10-CM | POA: Diagnosis not present

## 2019-11-14 ENCOUNTER — Ambulatory Visit
Admission: RE | Admit: 2019-11-14 | Discharge: 2019-11-14 | Disposition: A | Discharge status: Home / Self Care | Payer: Medicare HMO | Source: Ambulatory Visit | Attending: Radiation Oncology | Admitting: Radiation Oncology

## 2019-11-14 ENCOUNTER — Other Ambulatory Visit: Payer: Self-pay

## 2019-11-14 DIAGNOSIS — Z510 Encounter for antineoplastic radiation therapy: Secondary | ICD-10-CM | POA: Diagnosis present

## 2019-11-14 DIAGNOSIS — C61 Malignant neoplasm of prostate: Secondary | ICD-10-CM | POA: Diagnosis present

## 2019-11-15 ENCOUNTER — Other Ambulatory Visit: Payer: Self-pay

## 2019-11-15 ENCOUNTER — Ambulatory Visit
Admission: RE | Admit: 2019-11-15 | Discharge: 2019-11-15 | Disposition: A | Discharge status: Home / Self Care | Payer: Medicare HMO | Source: Ambulatory Visit | Attending: Radiation Oncology | Admitting: Radiation Oncology

## 2019-11-15 DIAGNOSIS — Z510 Encounter for antineoplastic radiation therapy: Secondary | ICD-10-CM | POA: Diagnosis not present

## 2019-11-16 NOTE — Assessment & Plan Note (Signed)
Managed now with oral appliance.  Plan- we will see him again if he needs [place].

## 2019-11-16 NOTE — Assessment & Plan Note (Signed)
Sometimes has to breathe through mouth, interfering with oral appliance use. Overall he feels his meds are adequate. Plan- refill astelin nasal spray

## 2019-11-19 ENCOUNTER — Ambulatory Visit
Admission: RE | Admit: 2019-11-19 | Discharge: 2019-11-19 | Disposition: A | Discharge status: Home / Self Care | Payer: Medicare HMO | Source: Ambulatory Visit | Attending: Radiation Oncology | Admitting: Radiation Oncology

## 2019-11-19 ENCOUNTER — Other Ambulatory Visit: Payer: Self-pay

## 2019-11-19 DIAGNOSIS — Z510 Encounter for antineoplastic radiation therapy: Secondary | ICD-10-CM | POA: Diagnosis not present

## 2019-11-20 ENCOUNTER — Other Ambulatory Visit: Payer: Self-pay

## 2019-11-20 ENCOUNTER — Ambulatory Visit
Admission: RE | Admit: 2019-11-20 | Discharge: 2019-11-20 | Disposition: A | Discharge status: Home / Self Care | Payer: Medicare HMO | Source: Ambulatory Visit | Attending: Radiation Oncology | Admitting: Radiation Oncology

## 2019-11-20 DIAGNOSIS — Z510 Encounter for antineoplastic radiation therapy: Secondary | ICD-10-CM | POA: Diagnosis not present

## 2019-11-21 ENCOUNTER — Other Ambulatory Visit: Payer: Self-pay

## 2019-11-21 ENCOUNTER — Ambulatory Visit
Admission: RE | Admit: 2019-11-21 | Discharge: 2019-11-21 | Disposition: A | Discharge status: Home / Self Care | Payer: Medicare HMO | Source: Ambulatory Visit | Attending: Radiation Oncology | Admitting: Radiation Oncology

## 2019-11-21 DIAGNOSIS — Z510 Encounter for antineoplastic radiation therapy: Secondary | ICD-10-CM | POA: Diagnosis not present

## 2019-11-22 ENCOUNTER — Ambulatory Visit
Admission: RE | Admit: 2019-11-22 | Discharge: 2019-11-22 | Disposition: A | Discharge status: Home / Self Care | Payer: Medicare HMO | Source: Ambulatory Visit | Attending: Radiation Oncology | Admitting: Radiation Oncology

## 2019-11-22 DIAGNOSIS — Z510 Encounter for antineoplastic radiation therapy: Secondary | ICD-10-CM | POA: Diagnosis not present

## 2019-11-25 ENCOUNTER — Ambulatory Visit
Admission: RE | Admit: 2019-11-25 | Discharge: 2019-11-25 | Disposition: A | Discharge status: Home / Self Care | Payer: Medicare HMO | Source: Ambulatory Visit | Attending: Radiation Oncology | Admitting: Radiation Oncology

## 2019-11-25 ENCOUNTER — Other Ambulatory Visit: Payer: Self-pay

## 2019-11-25 DIAGNOSIS — Z510 Encounter for antineoplastic radiation therapy: Secondary | ICD-10-CM | POA: Diagnosis not present

## 2019-11-26 ENCOUNTER — Ambulatory Visit
Admission: RE | Admit: 2019-11-26 | Discharge: 2019-11-26 | Disposition: A | Discharge status: Home / Self Care | Payer: Medicare HMO | Source: Ambulatory Visit | Attending: Radiation Oncology | Admitting: Radiation Oncology

## 2019-11-26 ENCOUNTER — Other Ambulatory Visit: Payer: Self-pay

## 2019-11-26 DIAGNOSIS — Z510 Encounter for antineoplastic radiation therapy: Secondary | ICD-10-CM | POA: Diagnosis not present

## 2019-11-27 ENCOUNTER — Ambulatory Visit
Admission: RE | Admit: 2019-11-27 | Discharge: 2019-11-27 | Disposition: A | Discharge status: Home / Self Care | Payer: Medicare HMO | Source: Ambulatory Visit | Attending: Radiation Oncology | Admitting: Radiation Oncology

## 2019-11-27 DIAGNOSIS — Z510 Encounter for antineoplastic radiation therapy: Secondary | ICD-10-CM | POA: Diagnosis not present

## 2019-11-28 ENCOUNTER — Other Ambulatory Visit: Payer: Self-pay

## 2019-11-28 ENCOUNTER — Ambulatory Visit
Admission: RE | Admit: 2019-11-28 | Discharge: 2019-11-28 | Disposition: A | Discharge status: Home / Self Care | Payer: Medicare HMO | Source: Ambulatory Visit | Attending: Radiation Oncology | Admitting: Radiation Oncology

## 2019-11-28 DIAGNOSIS — Z510 Encounter for antineoplastic radiation therapy: Secondary | ICD-10-CM | POA: Diagnosis not present

## 2019-11-29 ENCOUNTER — Other Ambulatory Visit: Payer: Self-pay

## 2019-11-29 ENCOUNTER — Ambulatory Visit
Admission: RE | Admit: 2019-11-29 | Discharge: 2019-11-29 | Disposition: A | Discharge status: Home / Self Care | Payer: Medicare HMO | Source: Ambulatory Visit | Attending: Radiation Oncology | Admitting: Radiation Oncology

## 2019-11-29 DIAGNOSIS — Z510 Encounter for antineoplastic radiation therapy: Secondary | ICD-10-CM | POA: Diagnosis not present

## 2019-12-02 ENCOUNTER — Ambulatory Visit
Admission: RE | Admit: 2019-12-02 | Discharge: 2019-12-02 | Disposition: A | Discharge status: Home / Self Care | Payer: Medicare HMO | Source: Ambulatory Visit | Attending: Radiation Oncology | Admitting: Radiation Oncology

## 2019-12-02 ENCOUNTER — Other Ambulatory Visit: Payer: Self-pay

## 2019-12-02 DIAGNOSIS — Z510 Encounter for antineoplastic radiation therapy: Secondary | ICD-10-CM | POA: Diagnosis not present

## 2019-12-03 ENCOUNTER — Ambulatory Visit
Admission: RE | Admit: 2019-12-03 | Discharge: 2019-12-03 | Disposition: A | Discharge status: Home / Self Care | Payer: Medicare HMO | Source: Ambulatory Visit | Attending: Radiation Oncology | Admitting: Radiation Oncology

## 2019-12-03 ENCOUNTER — Other Ambulatory Visit: Payer: Self-pay

## 2019-12-03 DIAGNOSIS — Z510 Encounter for antineoplastic radiation therapy: Secondary | ICD-10-CM | POA: Diagnosis not present

## 2019-12-04 ENCOUNTER — Ambulatory Visit
Admission: RE | Admit: 2019-12-04 | Discharge: 2019-12-04 | Disposition: A | Discharge status: Home / Self Care | Payer: Medicare HMO | Source: Ambulatory Visit | Attending: Radiation Oncology | Admitting: Radiation Oncology

## 2019-12-04 ENCOUNTER — Other Ambulatory Visit: Payer: Self-pay

## 2019-12-04 DIAGNOSIS — Z510 Encounter for antineoplastic radiation therapy: Secondary | ICD-10-CM | POA: Diagnosis not present

## 2019-12-05 ENCOUNTER — Ambulatory Visit
Admission: RE | Admit: 2019-12-05 | Discharge: 2019-12-05 | Disposition: A | Discharge status: Home / Self Care | Payer: Medicare HMO | Source: Ambulatory Visit | Attending: Radiation Oncology | Admitting: Radiation Oncology

## 2019-12-05 ENCOUNTER — Other Ambulatory Visit: Payer: Self-pay

## 2019-12-05 DIAGNOSIS — Z510 Encounter for antineoplastic radiation therapy: Secondary | ICD-10-CM | POA: Diagnosis not present

## 2019-12-06 ENCOUNTER — Other Ambulatory Visit: Payer: Self-pay | Admitting: Radiation Oncology

## 2019-12-06 ENCOUNTER — Ambulatory Visit
Admission: RE | Admit: 2019-12-06 | Discharge: 2019-12-06 | Disposition: A | Discharge status: Home / Self Care | Payer: Medicare HMO | Source: Ambulatory Visit | Attending: Radiation Oncology | Admitting: Radiation Oncology

## 2019-12-06 ENCOUNTER — Other Ambulatory Visit: Payer: Self-pay

## 2019-12-06 DIAGNOSIS — Z510 Encounter for antineoplastic radiation therapy: Secondary | ICD-10-CM | POA: Diagnosis not present

## 2019-12-09 ENCOUNTER — Ambulatory Visit
Admission: RE | Admit: 2019-12-09 | Discharge: 2019-12-09 | Disposition: A | Discharge status: Home / Self Care | Payer: Medicare HMO | Source: Ambulatory Visit | Attending: Radiation Oncology | Admitting: Radiation Oncology

## 2019-12-09 ENCOUNTER — Other Ambulatory Visit: Payer: Self-pay

## 2019-12-09 DIAGNOSIS — Z510 Encounter for antineoplastic radiation therapy: Secondary | ICD-10-CM | POA: Diagnosis not present

## 2019-12-10 ENCOUNTER — Other Ambulatory Visit: Payer: Self-pay

## 2019-12-10 ENCOUNTER — Ambulatory Visit
Admission: RE | Admit: 2019-12-10 | Discharge: 2019-12-10 | Disposition: A | Discharge status: Home / Self Care | Payer: Medicare HMO | Source: Ambulatory Visit | Attending: Radiation Oncology | Admitting: Radiation Oncology

## 2019-12-10 DIAGNOSIS — Z510 Encounter for antineoplastic radiation therapy: Secondary | ICD-10-CM | POA: Diagnosis not present

## 2019-12-11 ENCOUNTER — Ambulatory Visit
Admission: RE | Admit: 2019-12-11 | Discharge: 2019-12-11 | Disposition: A | Discharge status: Home / Self Care | Payer: Medicare HMO | Source: Ambulatory Visit | Attending: Radiation Oncology | Admitting: Radiation Oncology

## 2019-12-11 ENCOUNTER — Other Ambulatory Visit: Payer: Self-pay

## 2019-12-11 DIAGNOSIS — Z510 Encounter for antineoplastic radiation therapy: Secondary | ICD-10-CM | POA: Diagnosis not present

## 2019-12-12 ENCOUNTER — Other Ambulatory Visit: Payer: Self-pay

## 2019-12-12 ENCOUNTER — Ambulatory Visit
Admission: RE | Admit: 2019-12-12 | Discharge: 2019-12-12 | Disposition: A | Discharge status: Home / Self Care | Payer: Medicare HMO | Source: Ambulatory Visit | Attending: Radiation Oncology | Admitting: Radiation Oncology

## 2019-12-12 DIAGNOSIS — Z510 Encounter for antineoplastic radiation therapy: Secondary | ICD-10-CM | POA: Diagnosis not present

## 2019-12-13 ENCOUNTER — Ambulatory Visit
Admission: RE | Admit: 2019-12-13 | Discharge: 2019-12-13 | Disposition: A | Discharge status: Home / Self Care | Payer: Medicare HMO | Source: Ambulatory Visit | Attending: Radiation Oncology | Admitting: Radiation Oncology

## 2019-12-13 ENCOUNTER — Other Ambulatory Visit: Payer: Self-pay

## 2019-12-13 DIAGNOSIS — Z510 Encounter for antineoplastic radiation therapy: Secondary | ICD-10-CM | POA: Diagnosis not present

## 2019-12-16 ENCOUNTER — Other Ambulatory Visit: Payer: Self-pay

## 2019-12-16 ENCOUNTER — Ambulatory Visit
Admission: RE | Admit: 2019-12-16 | Discharge: 2019-12-16 | Disposition: A | Discharge status: Home / Self Care | Payer: Medicare HMO | Source: Ambulatory Visit | Attending: Radiation Oncology | Admitting: Radiation Oncology

## 2019-12-16 DIAGNOSIS — C61 Malignant neoplasm of prostate: Secondary | ICD-10-CM | POA: Diagnosis present

## 2019-12-16 DIAGNOSIS — Z510 Encounter for antineoplastic radiation therapy: Secondary | ICD-10-CM | POA: Diagnosis present

## 2019-12-17 ENCOUNTER — Ambulatory Visit
Admission: RE | Admit: 2019-12-17 | Discharge: 2019-12-17 | Disposition: A | Discharge status: Home / Self Care | Payer: Medicare HMO | Source: Ambulatory Visit | Attending: Radiation Oncology | Admitting: Radiation Oncology

## 2019-12-17 ENCOUNTER — Other Ambulatory Visit: Payer: Self-pay

## 2019-12-17 DIAGNOSIS — Z510 Encounter for antineoplastic radiation therapy: Secondary | ICD-10-CM | POA: Diagnosis not present

## 2019-12-18 ENCOUNTER — Ambulatory Visit
Admission: RE | Admit: 2019-12-18 | Discharge: 2019-12-18 | Disposition: A | Discharge status: Home / Self Care | Payer: Medicare HMO | Source: Ambulatory Visit | Attending: Radiation Oncology | Admitting: Radiation Oncology

## 2019-12-18 ENCOUNTER — Other Ambulatory Visit: Payer: Self-pay

## 2019-12-18 DIAGNOSIS — Z510 Encounter for antineoplastic radiation therapy: Secondary | ICD-10-CM | POA: Diagnosis not present

## 2019-12-19 ENCOUNTER — Ambulatory Visit
Admission: RE | Admit: 2019-12-19 | Discharge: 2019-12-19 | Disposition: A | Discharge status: Home / Self Care | Payer: Medicare HMO | Source: Ambulatory Visit | Attending: Radiation Oncology | Admitting: Radiation Oncology

## 2019-12-19 DIAGNOSIS — Z510 Encounter for antineoplastic radiation therapy: Secondary | ICD-10-CM | POA: Diagnosis not present

## 2019-12-20 ENCOUNTER — Other Ambulatory Visit: Payer: Self-pay

## 2019-12-20 ENCOUNTER — Ambulatory Visit
Admission: RE | Admit: 2019-12-20 | Discharge: 2019-12-20 | Disposition: A | Discharge status: Home / Self Care | Payer: Medicare HMO | Source: Ambulatory Visit | Attending: Radiation Oncology | Admitting: Radiation Oncology

## 2019-12-20 DIAGNOSIS — Z510 Encounter for antineoplastic radiation therapy: Secondary | ICD-10-CM | POA: Diagnosis not present

## 2019-12-23 ENCOUNTER — Other Ambulatory Visit: Payer: Self-pay

## 2019-12-23 ENCOUNTER — Ambulatory Visit
Admission: RE | Admit: 2019-12-23 | Discharge: 2019-12-23 | Disposition: A | Discharge status: Home / Self Care | Payer: Medicare HMO | Source: Ambulatory Visit | Attending: Radiation Oncology | Admitting: Radiation Oncology

## 2019-12-23 DIAGNOSIS — Z510 Encounter for antineoplastic radiation therapy: Secondary | ICD-10-CM | POA: Diagnosis not present

## 2019-12-24 ENCOUNTER — Other Ambulatory Visit: Payer: Self-pay

## 2019-12-24 ENCOUNTER — Ambulatory Visit
Admission: RE | Admit: 2019-12-24 | Discharge: 2019-12-24 | Disposition: A | Discharge status: Home / Self Care | Payer: Medicare HMO | Source: Ambulatory Visit | Attending: Radiation Oncology | Admitting: Radiation Oncology

## 2019-12-24 DIAGNOSIS — Z510 Encounter for antineoplastic radiation therapy: Secondary | ICD-10-CM | POA: Diagnosis not present

## 2019-12-25 ENCOUNTER — Encounter: Payer: Self-pay | Admitting: Family Medicine

## 2019-12-25 ENCOUNTER — Ambulatory Visit
Admission: RE | Admit: 2019-12-25 | Discharge: 2019-12-25 | Disposition: A | Discharge status: Home / Self Care | Payer: Medicare HMO | Source: Ambulatory Visit | Attending: Radiation Oncology | Admitting: Radiation Oncology

## 2019-12-25 ENCOUNTER — Other Ambulatory Visit: Payer: Self-pay

## 2019-12-25 DIAGNOSIS — Z510 Encounter for antineoplastic radiation therapy: Secondary | ICD-10-CM | POA: Diagnosis not present

## 2020-01-10 ENCOUNTER — Encounter: Payer: Self-pay | Admitting: Medical Oncology

## 2020-01-24 ENCOUNTER — Telehealth: Payer: Self-pay | Admitting: *Deleted

## 2020-01-24 NOTE — Telephone Encounter (Signed)
Called patient to alter fu for 01-29-20 due to change in Ashlyn's schedule, rescheduled for 9:30 am on 01-29-20, lvm for a return call

## 2020-01-29 ENCOUNTER — Other Ambulatory Visit: Payer: Self-pay

## 2020-01-29 ENCOUNTER — Ambulatory Visit
Admission: RE | Admit: 2020-01-29 | Discharge: 2020-01-29 | Disposition: A | Discharge status: Home / Self Care | Payer: Medicare HMO | Source: Ambulatory Visit | Attending: Urology | Admitting: Urology

## 2020-01-29 DIAGNOSIS — C61 Malignant neoplasm of prostate: Secondary | ICD-10-CM

## 2020-01-29 NOTE — Progress Notes (Signed)
Radiation Oncology         [phone] ________________________________  Name: Eugene [name] MRN: [id number]  Date: 01/29/2020  DOB: 08/13/1945  Post Treatment Note  CC: Eugene Han, MD  Eugene [name]*  Diagnosis:   74 y.o. gentleman with Stage T1c adenocarcinoma of the prostate with Gleason score of 4+5, and PSA of 12.5.  Interval Since Last Radiation:  5 weeks; concurrent with ADT-with 14-month Eligard injection started 08/06/2019 10/30/19 - 12/25/19: 1. The prostate, seminal vesicles, and pelvic lymph nodes were initially treated to 45 Gy in 25 fractions of 1.8 Gy  2. The prostate only was boosted to 75 Gy with 15 additional fractions of 2.0 Gy    Narrative: I spoke with the patient to conduct his routine scheduled 1 month follow up visit via telephone to spare the patient unnecessary potential exposure in the healthcare setting during the current COVID-19 pandemic.  The patient was notified in advance and gave permission to proceed with this visit format.   He tolerated radiation treatment relatively well with only minor urinary irritation and modest fatigue.  He reported increased frequency, nocturia 4-5 times per night, mild dysuria at the end of his stream, weaker flow of stream and slower stream.  He specifically denied gross hematuria, incomplete bladder emptying or incontinence.  He also experienced some constipation which was managed with MiraLAX and Colace as needed.  He continued to tolerate his ADT throughout the course of radiation despite periodic hot flashes and decreased stamina.                              On review of systems, the patient states that he is doing well in general.  He continues with increased frequency, nocturia, slow, weak stream and occasional dysuria.  He specifically denies gross hematuria, suprapubic discomfort, straining to void, incomplete bladder emptying or incontinence.  He denies abdominal pain, nausea, vomiting or diarrhea but  continues with occasional constipation which is relieved with MiraLAX as needed.  He is taking Flomax daily as prescribed and continues to tolerate the ADT fairly well despite fatigue and occasional hot flashes.  His most recent PSA was 0.34 on 12/03/19 and he received another 3 month Eligard injection on 12/10/19. Overall, he is pleased with his progress to date.  ALLERGIES:  is allergic to other and penicillins.  Meds: Current Outpatient Medications  Medication Sig Dispense Refill  . acetaminophen (TYLENOL) 325 MG tablet Take by mouth. Acetaminophen Arthritis.    Marland Kitchen allopurinol (ZYLOPRIM) 100 MG tablet Take 1 tablet by mouth daily.    Marland Kitchen amLODipine (NORVASC) 10 MG tablet Take 10 mg by mouth daily.    Marland Kitchen azelastine (ASTELIN) 0.1 % nasal spray 1-2 sprays each nostril every 12 hours if needed 90 mL 4  . carvedilol (COREG) 25 MG tablet Take 1 tablet (25 mg total) by mouth 2 (two) times daily with a meal. 180 tablet 1  . clotrimazole-betamethasone (LOTRISONE) cream     . fluticasone (FLONASE) 50 MCG/ACT nasal spray 2 sprays by Each Nare route daily as needed for Rhinitis.    Marland Kitchen gabapentin (NEURONTIN) 100 MG capsule     . loratadine (CLARITIN) 10 MG tablet Take 10 mg by mouth daily as needed for allergies.    Marland Kitchen losartan-hydrochlorothiazide (HYZAAR) 50-12.5 MG per tablet Take 1 tablet by mouth 2 (two) times daily. 180 tablet 1  . Miconazole Nitrate 2 % AERP Apply topically in the morning and at  bedtime.    . Multiple Vitamins-Minerals (CENTRUM SILVER PO) Take by mouth.    . polyethylene glycol (MIRALAX / GLYCOLAX) 17 g packet Take 17 g by mouth daily.    . tamsulosin (FLOMAX) 0.4 MG CAPS capsule     . Turmeric (QC TUMERIC COMPLEX PO) Take by mouth. Pt reports taking Tumeric root     No current facility-administered medications for this visit.    Physical Findings:  vitals were not taken for this visit.   Unable to assess due to telephone follow-up visit format  Lab Findings: Lab Results    Component Value Date   WBC 14.2 (H) 09/05/2018   HGB 12.6 (L) 09/05/2018   HCT 37.8 09/05/2018   MCV 98 (H) 09/05/2018   PLT 187 09/05/2018     Radiographic Findings: No results found.  Impression/Plan: 1. 74 y.o. gentleman with Stage T1c adenocarcinoma of the prostate with Gleason score of 4+5, and PSA of 12.5.   He will continue to follow up with urology for ongoing PSA determinations and has an appointment scheduled with Dr. Gloriann Loan on 03/06/20 when he will be due for his next Eligard injection. He has had an excellent response to ADT with his PSA at 0.34 on 12/03/19 and he understands what to expect with regards to PSA monitoring going forward. I will look forward to following his response to treatment via correspondence with urology, and would be happy to continue to participate in his care if clinically indicated.  He is going to try increasing his Flomax to twice daily dosing to see if this will further improve the LUTS.  If this is beneficial, I advised that I am more than happy to send a new prescription with twice daily dosing to his pharmacy so that he does not run out of the medication too early.  I also talked to the patient about what to expect in the future, including his risk for erectile dysfunction and rectal bleeding. I encouraged him to call or return to the office if he has any questions regarding his previous radiation or possible radiation side effects. He was comfortable with this plan and will follow up as needed.    Eugene Johns, PA-C

## 2020-01-29 NOTE — Progress Notes (Signed)
  Radiation Oncology         [phone] ________________________________  Name: Eugene [name] MRN: [id number]  Date: 12/25/2019  DOB: February 03, 1946  End of Treatment Note  Diagnosis:   74 y.o. gentleman with Stage T1c adenocarcinoma of the prostate with Gleason score of 4+5, and PSA of 12.5.     Indication for treatment:  Curative, Definitive Radiotherapy       Radiation treatment dates:   10/30/19 - 12/25/19  Site/dose:  1. The prostate, seminal vesicles, and pelvic lymph nodes were initially treated to 45 Gy in 25 fractions of 1.8 Gy  2. The prostate only was boosted to 75 Gy with 15 additional fractions of 2.0 Gy   Beams/energy:  1. The prostate, seminal vesicles, and pelvic lymph nodes were initially treated using VMAT intensity modulated radiotherapy delivering 6 megavolt photons. Image guidance was performed with CB-CT studies prior to each fraction. He was immobilized with a body fix lower extremity mold.  2. the prostate only was boosted using VMAT intensity modulated radiotherapy delivering 6 megavolt photons. Image guidance was performed with CB-CT studies prior to each fraction. He was immobilized with a body fix lower extremity mold.  Narrative: The patient tolerated radiation treatment relatively well with only minor urinary irritation and modest fatigue.  He reported increased frequency, nocturia 4-5 times per night, mild dysuria at the end of his stream, weaker flow of stream and slower stream.  He specifically denied gross hematuria, incomplete bladder emptying or incontinence.  He also experienced some constipation which was managed with MiraLAX and Colace as needed.  He continued to tolerate his ADT throughout the course of radiation despite periodic hot flashes and decreased stamina.  Plan: The patient has completed radiation treatment. He will return to radiation oncology clinic for routine followup in one month. I advised him to call or return sooner if he has any questions or  concerns related to his recovery or treatment. ________________________________  Sheral Apley. Tammi Klippel, M.D.

## 2020-02-04 ENCOUNTER — Telehealth: Payer: Self-pay | Admitting: Radiation Oncology

## 2020-02-04 NOTE — Telephone Encounter (Signed)
-----   Message from Freeman Caldron, [place] sent at 02/04/2020  8:29 AM EDT ----- Regarding: PSA results Please call patient to let him know that his PSA was 0.34 when last checked with Dr. Gloriann Loan 12/03/19- excellent response to ADT.  His next appointment with Dr. Gloriann Loan will be 03/06/20. Thank you! -Ashlyn

## 2020-02-04 NOTE — Telephone Encounter (Signed)
Per Freeman Caldron, PA-C request this RN phone patient. This RN verbalized that his PSA was 0.34 when last checked with Dr. Gloriann Loan 12/03/19- excellent response to ADT. Explained his next appointment with Dr. Gloriann Loan, his urologist, will be 03/06/20. Patient verbalized understanding and expressed appreciation for the call.

## 2020-03-14 ENCOUNTER — Ambulatory Visit: Payer: Medicare HMO | Attending: Internal Medicine

## 2020-03-14 DIAGNOSIS — Z23 Encounter for immunization: Secondary | ICD-10-CM

## 2020-03-14 NOTE — Progress Notes (Signed)
   Covid-19 Vaccination Clinic  Name:  Eugene [name]    MRN: [id number] DOB: 16-Sep-1945  03/14/2020  [name] was observed post Covid-19 immunization for 15 minutes without incident. He was provided with Vaccine Information Sheet and instruction to access the V-Safe system.   [name] was instructed to call 911 with any severe reactions post vaccine: Marland Kitchen Difficulty breathing  . Swelling of face and throat  . A fast heartbeat  . A bad rash all over body  . Dizziness and weakness

## 2020-04-01 ENCOUNTER — Ambulatory Visit: Payer: Medicare HMO | Admitting: Cardiology

## 2020-04-17 ENCOUNTER — Encounter: Payer: Self-pay | Admitting: Cardiology

## 2020-04-17 ENCOUNTER — Other Ambulatory Visit: Payer: Self-pay

## 2020-04-17 ENCOUNTER — Ambulatory Visit: Payer: Medicare HMO | Admitting: Cardiology

## 2020-04-17 VITALS — BP 116/70 | HR 91 | Resp 16 | Ht 68.0 in | Wt 258.0 lb

## 2020-04-17 DIAGNOSIS — E785 Hyperlipidemia, unspecified: Secondary | ICD-10-CM

## 2020-04-17 DIAGNOSIS — Z6841 Body Mass Index (BMI) 40.0 and over, adult: Secondary | ICD-10-CM

## 2020-04-17 DIAGNOSIS — Z6839 Body mass index (BMI) 39.0-39.9, adult: Secondary | ICD-10-CM

## 2020-04-17 DIAGNOSIS — I10 Essential (primary) hypertension: Secondary | ICD-10-CM

## 2020-04-17 DIAGNOSIS — I452 Bifascicular block: Secondary | ICD-10-CM

## 2020-04-17 DIAGNOSIS — G4733 Obstructive sleep apnea (adult) (pediatric): Secondary | ICD-10-CM

## 2020-04-17 DIAGNOSIS — I6523 Occlusion and stenosis of bilateral carotid arteries: Secondary | ICD-10-CM

## 2020-04-17 DIAGNOSIS — I370 Nonrheumatic pulmonary valve stenosis: Secondary | ICD-10-CM

## 2020-04-17 MED ORDER — ATORVASTATIN CALCIUM 10 MG PO TABS: 10.0000 mg | ORAL_TABLET | Freq: Every day | ORAL | 3 refills | Status: DC

## 2020-04-17 NOTE — Progress Notes (Signed)
Primary Physician/Referring:  Eugene Han, MD  Patient ID: Eugene [name], male    DOB: 10/23/1945, 74 y.o.   MRN: [id number]  Chief Complaint  Patient presents with  . Follow-up    1 year  . Pulmonary stenosis  . Hypertension   HPI:    Eugene [name]  is a 74 y.o. [name] with pulmonary valve stenosis and chronic dyspnea, hypertension, morbid obesity, obstructive sleep apnea and uses oral prosthesis, mild pulmonary valve stenosis of non-rheumatic etiology by TEE in 2016 (H/O Rh Fever as child), presents to the office to be evaluated for syncope that he had on 07/26/2018 with no recurrence.   He was diagnosed with prostate cancer, underwent chemotherapy followed by radiation therapy, received radiation since February 2021 through August 2021 and completed the course.  He is presently doing well with regard to this.  Except for chronic dyspnea no other specific symptoms today and is presently doing well.    Past Medical History:  Diagnosis Date  . Allergic rhinitis due to pollen   . Allergy    seasonal  . Anxiety   . Blood transfusion without reported diagnosis    do not except blood transfusions.  . Cataract    beginning stage  . DDD (degenerative disc disease), cervical    Severe w/ osteophytes on xray 3/16  . ED (erectile dysfunction)   . Fainting    07/26/18  . GERD (gastroesophageal reflux disease)   . Heart murmur   . History of Holter monitoring    started on march 19,2020  . Hypertension   . OBESITY   . Osteoarthritis   . Prostate cancer ([place])   . Pulmonary stenosis, valvar    TEE 07/2011: stable, LVEF 55%, small PFO  . Refusal of blood transfusions as patient is Jehovah's Witness   . Rheumatic fever    residual pulm stenosis, follows with Mehlani Blankenburg every 25mo . RHINITIS, CHRONIC   . Sleep apnea    wear mouth guard  . SLEEP APNEA, OBSTRUCTIVE dx 2000   NPSG 2000:  AHI 76/hr CPAP titrated to 11cm 2002    Past Surgical History:  Procedure Laterality Date   . COLONOSCOPY    . PROSTATE BIOPSY    . ROTATOR CUFF REPAIR     2018  . TEE WITHOUT CARDIOVERSION  08/02/2011   Procedure: TRANSESOPHAGEAL ECHOCARDIOGRAM (TEE);  Surgeon: JLaverda Page MD;  [facility];  Service: Cardiovascular;  Laterality: N/A;   Family History  Problem Relation Age of Onset  . Heart disease Mother   . Hypertension Other   . Melanoma Brother   . Heart disease Brother   . Colon cancer Neg Hx   . Stomach cancer Neg Hx   . Rectal cancer Neg Hx   . Esophageal cancer Neg Hx   . Prostate cancer Neg Hx   . Pancreatic cancer Neg Hx     Social History   Tobacco Use  . Smoking status: Never Smoker  . Smokeless tobacco: Never Used  Substance Use Topics  . Alcohol use: Yes    Alcohol/week: 0.0 standard drinks    Comment: on weekends liquor   Marital Status: Married   ROS  Review of Systems  Constitutional: Negative for malaise/fatigue and weight gain.  Cardiovascular: Positive for dyspnea on exertion. Negative for chest pain, claudication, leg swelling, near-syncope, orthopnea, palpitations, paroxysmal nocturnal dyspnea and syncope.  Respiratory: Negative for shortness of breath.   Hematologic/Lymphatic: Does not bruise/bleed easily.  Musculoskeletal: Positive for back  pain (Chronic, from bulging disc) and joint pain (Bilateral knee).  Gastrointestinal: Negative for melena.  Neurological: Negative for dizziness and weakness.    Objective  Blood pressure 116/70, pulse 91, resp. rate 16, height 5' 8"  (1.727 m), weight 258 lb (117 kg), SpO2 99 %.  Vitals with BMI 04/17/2020 11/11/2019 07/19/2019  Height 5' 8"  5' 8"  5' 9"   Weight 258 lbs 263 lbs 10 oz 266 lbs  BMI 39.24 51.76 16.07  Systolic 371 062 -  Diastolic 70 70 -  Pulse 91 75 -      Physical Exam Vitals reviewed.  Constitutional:      Appearance: He is obese.     Comments: Morbidly obese   HENT:     Head: Normocephalic and atraumatic.  Cardiovascular:     Rate and Rhythm: Normal  rate and regular rhythm.     Pulses: Intact distal pulses.          Carotid pulses are on the right side with bruit and on the left side with bruit.    Heart sounds: S1 normal and S2 normal. Murmur heard.  Harsh early systolic murmur is present with a grade of 3/6 at the upper left sternal border.  No gallop.      Comments: S2 physiologic splitting  Pulmonary:     Effort: Pulmonary effort is normal. No respiratory distress.     Breath sounds: No wheezing, rhonchi or rales.  Musculoskeletal:     Right lower leg: No edema.     Left lower leg: No edema.  Neurological:     Mental Status: He is alert.     Laboratory examination:   No results for input(s): NA, K, CL, CO2, GLUCOSE, BUN, CREATININE, CALCIUM, GFRNONAA, GFRAA in the last 8760 hours. CrCl cannot be calculated (Patient's most recent lab result is older than the maximum 21 days allowed.).  CMP Latest Ref Rng & Units 09/05/2018 04/27/2015 07/22/2014  Glucose 65 - 99 mg/dL 92 93 90  BUN 8 - 27 mg/dL 24 18 26(H)  Creatinine 0.76 - 1.27 mg/dL 1.42(H) 1.30(H) 1.55(H)  Sodium 134 - 144 mmol/L 142 139 141  Potassium 3.5 - 5.2 mmol/L 4.1 3.7 4.2  Chloride 96 - 106 mmol/L 103 105 105  CO2 20 - 29 mmol/L 26 - 33(H)  Calcium 8.6 - 10.2 mg/dL 9.9 - 9.5  Total Protein 6.0 - 8.5 g/dL 6.5 - 7.2  Total Bilirubin 0.0 - 1.2 mg/dL 0.9 - 0.8  Alkaline Phos 39 - 117 IU/L 86 - 70  AST 0 - 40 IU/L 26 - 36  ALT 0 - 44 IU/L 22 - 23   CBC Latest Ref Rng & Units 09/05/2018 04/27/2015 04/27/2015  WBC 3.4 - 10.8 x10E3/uL 14.2(H) - 11.3(H)  Hemoglobin 13.0 - 17.7 g/dL 12.6(L) 14.3 13.5  Hematocrit 37.5 - 51.0 % 37.8 42.0 40.7  Platelets 150 - 450 x10E3/uL 187 - 159   Lipid Panel     Component Value Date/Time   CHOL 151 09/05/2018 0826   TRIG 121 09/05/2018 0826   HDL 40 09/05/2018 0826   CHOLHDL 3.8 09/05/2018 0826   CHOLHDL 3 07/22/2014 1138   VLDL 19.8 07/22/2014 1138   LDLCALC 87 09/05/2018 0826   LABVLDL 24 09/05/2018 0826    HEMOGLOBIN  A1C Lab Results  Component Value Date   HGBA1C 4.6 07/22/2014   TSH No results for input(s): TSH in the last 8760 hours.  External labs:  04/05/2019:  Sodium 139, potassium 4.3, BUN 23, creatinine  1.4, EGFR 57 Hemoglobin 13.4, hematocrit 40.5, platelets 181  Medications and allergies   Allergies  Allergen Reactions  . Other Other (See Comments)    Adhesive tape causes skin to peel and darken.  Marland Kitchen Penicillins     Has patient had a PCN reaction causing immediate rash, facial/tongue/throat swelling, SOB or lightheadedness with hypotension:NO Has patient had a PCN reaction causing severe rash involving mucus membranes or skin necrosis:NO Has patient had a PCN reaction that required hospitalization NO Has patient had a PCN reaction occurring within the last 10 years: NO If all of the above answers are "NO", then may proceed with Cephalosporin use.     Outpatient Medications Prior to Visit  Medication Sig Dispense Refill  . acetaminophen (TYLENOL) 325 MG tablet Take by mouth. Acetaminophen Arthritis.    Marland Kitchen allopurinol (ZYLOPRIM) 100 MG tablet Take 1 tablet by mouth daily.    Marland Kitchen amLODipine (NORVASC) 10 MG tablet Take 10 mg by mouth daily.    Marland Kitchen azelastine (ASTELIN) 0.1 % nasal spray 1-2 sprays each nostril every 12 hours if needed 90 mL 4  . carvedilol (COREG) 25 MG tablet Take 1 tablet (25 mg total) by mouth 2 (two) times daily with a meal. 180 tablet 1  . Cholecalciferol (VITAMIN D3) 50 MCG (2000 UT) CHEW Chew 2,000 mcg by mouth.    . clotrimazole-betamethasone (LOTRISONE) cream     . Coenzyme Q10 (COQ10) 200 MG CAPS Take by mouth.    . fluticasone (FLONASE) 50 MCG/ACT nasal spray 2 sprays by Each Nare route daily as needed for Rhinitis.    Marland Kitchen GLUTATHIONE PO Take by mouth.    . loratadine (CLARITIN) 10 MG tablet Take 10 mg by mouth daily as needed for allergies.    Marland Kitchen losartan-hydrochlorothiazide (HYZAAR) 50-12.5 MG per tablet Take 1 tablet by mouth 2 (two) times daily. 180 tablet 1  .  Misc Natural Products (COLON CLEANSE PO) Take by mouth.    . polyethylene glycol (MIRALAX / GLYCOLAX) 17 g packet Take 17 g by mouth as needed.     . tamsulosin (FLOMAX) 0.4 MG CAPS capsule     . Turmeric (QC TUMERIC COMPLEX PO) Take by mouth. Pt reports taking Tumeric root    . gabapentin (NEURONTIN) 100 MG capsule  (Patient not taking: Reported on 04/17/2020)    . Miconazole Nitrate 2 % AERP Apply topically in the morning and at bedtime. (Patient not taking: Reported on 04/17/2020)    . Multiple Vitamins-Minerals (CENTRUM SILVER PO) Take by mouth. (Patient not taking: Reported on 04/17/2020)     No facility-administered medications prior to visit.     Radiology:   No results found.  Cardiac Studies:   Echocardiogram 09/19/2018: Left ventricle cavity is normal in size. Normal left ventricular wall thickness. Abnormal septal wall motion due interventricular conduction abnormality.  Doppler evidence of grade I (impaired) diastolic dysfunction, normal LAP. Calculated EF 66%. Left atrial cavity is moderately dilated. Mild to moderate mitral regurgitation. Mild tricuspid regurgitation. Estimated pulmonary artery systolic pressure 20 mmHg. Mild pulmonic valve stenosis. Peak gradient 19 mmHg. No significant change compared to previous study dated 02/20/2018.   Exercise myoview stress 09/10/2018:  1. The patient performed treadmill exercise using Bruce protocol, completing 5:15 minutes. The patient completed an estimated workload of 6.7 METS, reaching 103% of the maximum predicted heart rate. Exercise capacity was low. Hemodynamic response was normal. Stress symptoms included dyspnea. The resting electrocardiogram demonstrated normal sinus rhythm, RBBB + LAHB and secondary ST-T changes in  anteroseptal leads.  The stress electrocardiogram showed sinus tachycardia, RBBB + LAHB, occasional PVC's and secondary ST-T changes anteroseptal leads, unchanged compared to resting EKG.  No ischemic changes seen  on stress electrocardiogram.  2. The overall quality of the study is good.  Left ventricular cavity is noted to be normal on the rest and stress studies.  Gated SPECT images reveal normal myocardial thickening and wall motion.  The left ventricular ejection fraction was calculated or visually estimated to be 88%. SPECT rest and stress images reveal decreased tracer uptake in apical, inferior myocardium, likely due to diaphragmatic attenuation.  3. Low risk study.   Event Monitor for 30 days Start date 08/05/2018: Indications syncope and collapse No symptoms reported.  Maximum heart rate 124 bpm, minimum heart rate 58 bpm, sinus rhythm.  Occasional PACs. No heart block.  Chest x-ray 12/14/2017: Heart size mildly enlarged, pulmonary artery enlargement unchanged from prior study dated 03/20/05/2017, findings consistent with pulmonary artery hypertension.  Ultrasound of the thyroid 06/25/2018: Bilateral thyroid nodules.  Being followed by endocrinology.  Carotid artery duplex 06/19/2018: Minimal heterogeneous calcific plaque but no significant stenosis. No significant change from  Carotid artery duplex 03/18/2014, 07/26/2010.  EKG:    EKG 04/17/2020: Normal sinus rhythm at rate of 90 bpm, left atrial enlargement, left axis deviation, left intrafascicular block.  Right bundle branch block.  Nonspecific T abnormality.  EKG 04/17/2020: Sinus rhythm at a rate of 90 bpm with biatrial enlargement.  Left axis deviation, left anterior fascicular block.  Right bundle branch block.  Bifascicular block.  Compared to EKG 04/01/2019, no significant change.   Assessment     ICD-10-CM   1. Essential hypertension  I10 EKG 12-Lead  2. Nonrheumatic pulmonary valve stenosis  I37.0   3. Bifascicular block  I45.2   4. Class 3 severe obesity due to excess calories without serious comorbidity with body mass index (BMI) of 40.0 to 44.9 in adult (HCC)  E66.01    Z68.41   5. OSA (obstructive sleep apnea) using mouth  piece for therapy  G47.33   6. Class 2 severe obesity due to excess calories with serious comorbidity and body mass index (BMI) of 39.0 to 39.9 in adult (HCC)  E66.01    Z68.39   7. Atherosclerosis of both carotid arteries  I65.23 atorvastatin (LIPITOR) 10 MG tablet    Lipid Panel With LDL/HDL Ratio  8. Mild hyperlipidemia  E78.5 Lipid Panel With LDL/HDL Ratio     Medications Discontinued During This Encounter  Medication Reason  . gabapentin (NEURONTIN) 100 MG capsule No longer needed (for PRN medications)  . Miconazole Nitrate 2 % AERP No longer needed (for PRN medications)  . Multiple Vitamins-Minerals (CENTRUM SILVER PO) Patient Preference    Meds ordered this encounter  Medications  . atorvastatin (LIPITOR) 10 MG tablet    Sig: Take 1 tablet (10 mg total) by mouth daily.    Dispense:  90 tablet    Refill:  3    Recommendations:   Trelon Plush is a 74 y.o. [name] with pulmonary valve stenosis and chronic dyspnea, hypertension, morbid obesity, obstructive sleep apnea and uses oral prosthesis, mild pulmonary valve stenosis of non-rheumatic etiology by TEE in 2016 (H/O Rh Fever as child), presents to the office to be evaluated for syncope that he had on 07/26/2018 with no recurrence.   He had back surgery in November 2020 with her perioperative cardiac complications.  He has only had partial relief of sciatica.  He was diagnosed with prostate  cancer, underwent chemotherapy followed by radiation therapy, received radiation since February 2021 through August 2021 and completed the course.  He is presently doing well with regard to this.   This is his annual visit, he remains stable from cardiac standpoint, no change in pulmonary stenotic murmur and also blood pressure is well controlled.  Lipids previously a year ago were very well controlled as well.  He does have bilateral carotid bruit, he has had recent carotid artery duplex did not show any significant abnormality.  However in view of  bilateral carotid atherosclerosis, I would like him to be on atorvastatin 10 mg daily, will obtain a lipid profile testing in 2 to 3 months.  Weight loss again discussed with the patient.  Otherwise stable from cardiac standpoint, I will see him back in a year or sooner if problems.  This was a 40-minute encounter, with discussion regarding his prostate cancer, back surgery, discussion regarding lipid management that is necessary and review of external records.   Adrian Prows, MD, [facility] 04/17/2020, 11:31 AM Office: [phone] Pager: [phone]

## 2020-10-20 LAB — HM COLONOSCOPY

## 2020-10-30 ENCOUNTER — Other Ambulatory Visit: Payer: Self-pay

## 2020-10-30 ENCOUNTER — Other Ambulatory Visit (HOSPITAL_BASED_OUTPATIENT_CLINIC_OR_DEPARTMENT_OTHER): Payer: Self-pay

## 2020-10-30 ENCOUNTER — Ambulatory Visit: Payer: Medicare HMO | Attending: Internal Medicine

## 2020-10-30 DIAGNOSIS — Z23 Encounter for immunization: Secondary | ICD-10-CM

## 2020-10-30 MED ORDER — PFIZER-BIONT COVID-19 VAC-TRIS 30 MCG/0.3ML IM SUSP
INTRAMUSCULAR | 0 refills | Status: DC
  Filled 2020-10-30: qty 0.3, 1d supply, fill #0

## 2020-10-30 NOTE — Progress Notes (Signed)
   Covid-19 Vaccination Clinic  Name:  Eugene [name]    MRN: [id number] DOB: 08/13/1945  10/30/2020  Eugene [name] was observed post Covid-19 immunization for 15 minutes without incident. He was provided with Vaccine Information Sheet and instruction to access the V-Safe system.   Eugene [name] was instructed to call 911 with any severe reactions post vaccine: Difficulty breathing  Swelling of face and throat  A fast heartbeat  A bad rash all over body  Dizziness and weakness   Immunizations Administered     Name Date Dose VIS Date Route   PFIZER Comrnaty(Gray TOP) Covid-19 Vaccine 10/30/2020 10:43 AM 0.3 mL 04/23/2020 Intramuscular   Manufacturer: [place]   Lot: [id number]   [place]: [phone]

## 2021-01-21 ENCOUNTER — Other Ambulatory Visit: Payer: Self-pay | Admitting: Cardiology

## 2021-01-21 DIAGNOSIS — I6523 Occlusion and stenosis of bilateral carotid arteries: Secondary | ICD-10-CM

## 2021-03-22 ENCOUNTER — Other Ambulatory Visit: Payer: Self-pay

## 2021-03-22 ENCOUNTER — Other Ambulatory Visit (HOSPITAL_BASED_OUTPATIENT_CLINIC_OR_DEPARTMENT_OTHER): Payer: Self-pay

## 2021-03-22 ENCOUNTER — Ambulatory Visit: Payer: Medicare HMO | Attending: Internal Medicine

## 2021-03-22 DIAGNOSIS — Z23 Encounter for immunization: Secondary | ICD-10-CM

## 2021-03-22 MED ORDER — PFIZER COVID-19 VAC BIVALENT 30 MCG/0.3ML IM SUSP
INTRAMUSCULAR | 0 refills | Status: DC
  Filled 2021-03-22: qty 0.3, 1d supply, fill #0

## 2021-03-22 NOTE — Progress Notes (Signed)
   Covid-19 Vaccination Clinic  Name:  Eugene [name]    MRN: [id number] DOB: 10/03/1945  03/22/2021  Eugene [name] was observed post Covid-19 immunization for 15 minutes without incident. He was provided with Vaccine Information Sheet and instruction to access the V-Safe system.   Eugene [name] was instructed to call 911 with any severe reactions post vaccine: Difficulty breathing  Swelling of face and throat  A fast heartbeat  A bad rash all over body  Dizziness and weakness   Immunizations Administered     Name Date Dose VIS Date Route   Pfizer Covid-19 Vaccine Bivalent Booster 03/22/2021  3:45 PM 0.3 mL 01/13/2021 Intramuscular   Manufacturer: [place]   Lot: [id number]   [place]: [phone]

## 2021-03-26 ENCOUNTER — Ambulatory Visit: Payer: Medicare HMO | Admitting: Family Medicine

## 2021-03-26 ENCOUNTER — Encounter: Payer: Self-pay | Admitting: Family Medicine

## 2021-03-26 VITALS — BP 116/66 | Ht 70.0 in | Wt 260.0 lb

## 2021-03-26 DIAGNOSIS — M5416 Radiculopathy, lumbar region: Secondary | ICD-10-CM | POA: Insufficient documentation

## 2021-03-26 NOTE — Assessment & Plan Note (Signed)
Symptoms consistent with spinal stenosis/foraminal stenosis L4 and L5.  Has had previous surgery which I think was discectomy at that area.  We reviewed his MRI.  I think the best bet would to be to try corticosteroid injections under fluoroscope.  Not sure if he will need a new updated MRI before radiology will be willing to do that.  We will contact their office next week and make that assessment.  We will set him up after that.  His PCP has been gradually increasing gabapentin and I agree with that.  I would continue to increase until the 1800-2400 mg level.  Creatinine clearance is reviewed.  He is amenable to this plan.

## 2021-03-26 NOTE — Patient Instructions (Addendum)
As we discussed, I think injections under fluoroscope would be the best option for your pain.  We will have to check with their office early next week and see if we need to order a new MRI since your last MRI was in 2020.  Hopefully we can avoid that but since you have had surgery after the MRI, the radiologist may want to have new imaging.  If so, we can order that for you.  If you have not heard from [place] by Thursday of next week, please call my office.  Regarding your symptoms, I think your PCP is on the right track with increasing the gabapentin.  You tell me he has increase that to 300 mg 3 times daily and you are waiting for the prescription.  As we discussed, gradually increasing that is probably a great idea.  I would recommend probably not discontinuing the medicine until we got you into the range of 1800 to 2400 mg a day.  If you are not having relief by that time, it might not be useful.  For now I will let your PCP handle that medication.  It was nice to meet you!

## 2021-03-26 NOTE — Progress Notes (Signed)
  Eugene [name] - 75 y.o. male MRN [id number]  Date of birth: 02-Jan-1946    SUBJECTIVE:      Chief Complaint:/ HPI:  Low back pain with radiation down into his legs. Has had back issues for many years.  In 2020 he had back surgery which improved his symptoms for about 4 months then they started to return.  He is now having the symptoms daily.  Most of the time there were severity is between 2 and 7 out of 10.  He does have problems at night with radiation particularly into the right leg and if he changes positions sometimes that will improve.  His PCP placed him on gabapentin and has recently increased the dose but he has not yet received the new prescription so he is currently still on the original dose of 100 mg 3 times a day.  He does not seem that helps him much.  Symptoms are low back pain radiating mostly into the right thigh with the addition of bilateral paresthesias in the feet, sometimes at right numbness in the feet.  Most aggravating thing is if he walks for more than 10 or 15 minutes he will have significant increase in symptoms which improved some with seated or supine position.  He is seeing urology for prostate cancer but is currently having no new symptoms involving bowel or bladder incontinence.    OBJECTIVE: BP 116/66   Ht 5\' 10"  (1.778 m)   Wt 260 lb (117.9 kg)   BMI 37.31 kg/m   Physical Exam:  Vital signs are reviewed. GENERAL: Well-developed overweight male no acute distress BACK: No be defect noted.  No tenderness to percussion or palpation of the lumbar spine area.  Limited flexion of the hip secondary to muscular and bony stiffness.  No increase in symptoms with hyperextension.  IMAGING: MRI lumbar spine June, 2020: Report:L3-4: Bulging degenerated annulus and osteophytic ridging contributing to mild spinal and bilateral lateral recess stenosis. No foraminal stenosis.   L4-5: Diffuse bulging uncovered disc, central disc protrusion, short pedicles and ligamentum  flavum thickening contributing to severe spinal and bilateral lateral recess stenosis. No significant foraminal stenosis.   L5-S1: Bulging annulus and shallow central disc protrusion but the spinal canal is generous in is no significant spinal or foraminal stenosis.   IMPRESSION: 1. Degenerative lumbar spondylosis with multilevel disc disease and facet disease. 2. The most significant finding is severe multifactorial spinal and bilateral lateral recess stenosis at L4-5.  I have independently reviewed the images and agree with the report with the addition of the following comments:   ASSESSMENT & PLAN:  See problem based charting & AVS for pt instructions. Lumbar radiculopathy, chronic Symptoms consistent with spinal stenosis/foraminal stenosis L4 and L5.  Has had previous surgery which I think was discectomy at that area.  We reviewed his MRI.  I think the best bet would to be to try corticosteroid injections under fluoroscope.  Not sure if he will need a new updated MRI before radiology will be willing to do that.  We will contact their office next week and make that assessment.  We will set him up after that.  His PCP has been gradually increasing gabapentin and I agree with that.  I would continue to increase until the 1800-2400 mg level.  Creatinine clearance is reviewed.  He is amenable to this plan.

## 2021-03-29 ENCOUNTER — Other Ambulatory Visit: Payer: Self-pay | Admitting: Family Medicine

## 2021-03-29 ENCOUNTER — Ambulatory Visit
Admission: RE | Admit: 2021-03-29 | Discharge: 2021-03-29 | Disposition: A | Discharge status: Home / Self Care | Payer: Medicare HMO | Source: Ambulatory Visit | Attending: Family Medicine | Admitting: Family Medicine

## 2021-03-29 DIAGNOSIS — M5416 Radiculopathy, lumbar region: Secondary | ICD-10-CM

## 2021-03-30 ENCOUNTER — Other Ambulatory Visit: Payer: Self-pay | Admitting: Family Medicine

## 2021-03-30 ENCOUNTER — Other Ambulatory Visit: Payer: Self-pay

## 2021-03-30 DIAGNOSIS — M5416 Radiculopathy, lumbar region: Secondary | ICD-10-CM

## 2021-04-05 ENCOUNTER — Other Ambulatory Visit: Payer: Self-pay

## 2021-04-05 ENCOUNTER — Ambulatory Visit
Admission: RE | Admit: 2021-04-05 | Discharge: 2021-04-05 | Disposition: A | Discharge status: Home / Self Care | Payer: Medicare HMO | Source: Ambulatory Visit | Attending: Family Medicine | Admitting: Family Medicine

## 2021-04-05 DIAGNOSIS — M5416 Radiculopathy, lumbar region: Secondary | ICD-10-CM

## 2021-04-05 MED ORDER — METHYLPREDNISOLONE ACETATE 40 MG/ML INJ SUSP (RADIOLOG
80.0000 mg | Freq: Once | INTRAMUSCULAR | Status: AC
  Administered 2021-04-05: 80 mg via EPIDURAL

## 2021-04-05 MED ORDER — IOPAMIDOL (ISOVUE-M 200) INJECTION 41%
1.0000 mL | Freq: Once | INTRAMUSCULAR | Status: AC
  Administered 2021-04-05: 1 mL via EPIDURAL

## 2021-04-05 NOTE — Discharge Instructions (Signed)

## 2021-04-14 ENCOUNTER — Encounter: Payer: Self-pay | Admitting: Family Medicine

## 2021-04-19 ENCOUNTER — Ambulatory Visit: Payer: Medicare HMO | Admitting: Cardiology

## 2021-04-19 ENCOUNTER — Encounter: Payer: Self-pay | Admitting: Cardiology

## 2021-04-19 ENCOUNTER — Other Ambulatory Visit: Payer: Self-pay

## 2021-04-19 VITALS — BP 102/66 | HR 84 | Temp 97.6°F | Resp 16 | Ht 70.0 in | Wt 272.2 lb

## 2021-04-19 DIAGNOSIS — R0989 Other specified symptoms and signs involving the circulatory and respiratory systems: Secondary | ICD-10-CM

## 2021-04-19 DIAGNOSIS — I10 Essential (primary) hypertension: Secondary | ICD-10-CM

## 2021-04-19 DIAGNOSIS — I370 Nonrheumatic pulmonary valve stenosis: Secondary | ICD-10-CM

## 2021-04-19 NOTE — Progress Notes (Signed)
Primary Physician/Referring:  [name], MD  Patient ID: Eugene [name], male    DOB: 1945-12-27, 75 y.o.   MRN: [id number]  Chief Complaint  Patient presents with   Pulmonary stenosis   Hypertension   Follow-up    1 year   HPI:    Eugene [name]  is a 75 y.o. AAM with pulmonary valve stenosis and chronic dyspnea, hypertension, morbid obesity, obstructive sleep apnea and presently not using oral prosthesis, mild pulmonary valve stenosis of non-rheumatic etiology by TEE in 2016 (H/O Rh Fever as child),  presents for annual visit for hypertension and pulmonary stenosis.   He was diagnosed with prostate cancer, underwent chemotherapy followed by radiation therapy, received radiation since February 2021 through August 2021 and completed the course.  He is presently doing well with regard to this.  Except for chronic dyspnea no other specific symptoms today and is presently doing well.    Past Medical History:  Diagnosis Date   Allergic rhinitis due to pollen    Allergy    seasonal   Anxiety    Blood transfusion without reported diagnosis    do not except blood transfusions.   Cataract    beginning stage   DDD (degenerative disc disease), cervical    Severe w/ osteophytes on xray 3/16   ED (erectile dysfunction)    GERD (gastroesophageal reflux disease)    Heart murmur    History of Holter monitoring    started on march 19,2020   Hypertension    OBESITY    Osteoarthritis    Prostate cancer ([place])    Pulmonary stenosis, valvar    TEE 07/2011: stable, LVEF 55%, small PFO   Refusal of blood transfusions as patient is Jehovah's Witness    Rheumatic fever    residual pulm stenosis, follows with Tam Savoia every 48mo  RHINITIS, CHRONIC    SLEEP APNEA, OBSTRUCTIVE dx 2000   NPSG 2000:  AHI 76/hr CPAP titrated to 11cm 2002    Past Surgical History:  Procedure Laterality Date   COLONOSCOPY     PROSTATE BIOPSY     ROTATOR CUFF REPAIR     2018   TEE WITHOUT CARDIOVERSION   08/02/2011   Procedure: TRANSESOPHAGEAL ECHOCARDIOGRAM (TEE);  Surgeon: JLaverda Page MD;  [facility];  Service: Cardiovascular;  Laterality: N/A;   Family History  Problem Relation Age of Onset   Heart disease Mother    Hypertension Other    Melanoma Brother    Heart disease Brother    Colon cancer Neg Hx    Stomach cancer Neg Hx    Rectal cancer Neg Hx    Esophageal cancer Neg Hx    Prostate cancer Neg Hx    Pancreatic cancer Neg Hx     Social History   Tobacco Use   Smoking status: Never   Smokeless tobacco: Never  Substance Use Topics   Alcohol use: Yes    Alcohol/week: 0.0 standard drinks    Comment: on weekends liquor   Marital Status: Married   ROS  Review of Systems  Cardiovascular:  Positive for dyspnea on exertion. Negative for chest pain and leg swelling.  Respiratory:  Positive for snoring.   Musculoskeletal:  Positive for arthritis and back pain.  Gastrointestinal:  Negative for melena.   Objective  Blood pressure 102/66, pulse 84, temperature 97.6 F (36.4 C), temperature source Temporal, resp. rate 16, height _0  (1.778 m), weight 272 lb 3.2 oz (123.5 kg), SpO2 96 %.  Vitals with BMI 04/19/2021 04/05/2021 03/26/2021  Height _0  - _1   Weight 272 lbs 3 oz - 260 lbs  BMI 30.09 - 23.30  Systolic 076 226 333  Diastolic 66 74 66  Pulse 84 69 -      Physical Exam Vitals reviewed.  Constitutional:      Appearance: He is obese.     Comments: Morbidly obese   Neck:     Vascular: Carotid bruit (bilateral) present. No JVD.  Cardiovascular:     Rate and Rhythm: Normal rate and regular rhythm.     Pulses: Normal pulses and intact distal pulses.     Heart sounds: S1 normal and S2 normal. Murmur heard.  Harsh early systolic murmur is present with a grade of 3/6 at the upper left sternal border.    No gallop.  Pulmonary:     Effort: Pulmonary effort is normal. No respiratory distress.     Breath sounds: No wheezing, rhonchi or  rales.  Musculoskeletal:     Right lower leg: Edema (1-2+) present.     Left lower leg: Edema (1-2+) present.    Laboratory examination:   No results for input(s): NA, K, CL, CO2, GLUCOSE, BUN, CREATININE, CALCIUM, GFRNONAA, GFRAA in the last 8760 hours. CrCl cannot be calculated (Patient's most recent lab result is older than the maximum 21 days allowed.).  CMP Latest Ref Rng & Units 09/05/2018 04/27/2015 07/22/2014  Glucose 65 - 99 mg/dL 92 93 90  BUN 8 - 27 mg/dL 24 18 26(H)  Creatinine 0.76 - 1.27 mg/dL 1.42(H) 1.30(H) 1.55(H)  Sodium 134 - 144 mmol/L 142 139 141  Potassium 3.5 - 5.2 mmol/L 4.1 3.7 4.2  Chloride 96 - 106 mmol/L 103 105 105  CO2 20 - 29 mmol/L 26 - 33(H)  Calcium 8.6 - 10.2 mg/dL 9.9 - 9.5  Total Protein 6.0 - 8.5 g/dL 6.5 - 7.2  Total Bilirubin 0.0 - 1.2 mg/dL 0.9 - 0.8  Alkaline Phos 39 - 117 IU/L 86 - 70  AST 0 - 40 IU/L 26 - 36  ALT 0 - 44 IU/L 22 - 23   CBC Latest Ref Rng & Units 09/05/2018 04/27/2015 04/27/2015  WBC 3.4 - 10.8 x10E3/uL 14.2(H) - 11.3(H)  Hemoglobin 13.0 - 17.7 g/dL 12.6(L) 14.3 13.5  Hematocrit 37.5 - 51.0 % 37.8 42.0 40.7  Platelets 150 - 450 x10E3/uL 187 - 159    Lipid Panel     Component Value Date/Time   CHOL 151 09/05/2018 0826   TRIG 121 09/05/2018 0826   HDL 40 09/05/2018 0826   CHOLHDL 3.8 09/05/2018 0826   CHOLHDL 3 07/22/2014 1138   VLDL 19.8 07/22/2014 1138   LDLCALC 87 09/05/2018 0826   LABVLDL 24 09/05/2018 0826    HEMOGLOBIN A1C Lab Results  Component Value Date   HGBA1C 4.6 07/22/2014   TSH No results for input(s): TSH in the last 8760 hours.  External labs:  04/05/2019:  Sodium 139, potassium 4.3, BUN 23, creatinine 1.4, EGFR 57 Hemoglobin 13.4, hematocrit 40.5, platelets 181  Medications and allergies   Allergies  Allergen Reactions   Other Other (See Comments)    Adhesive tape causes skin to peel and darken.   Penicillins     Has patient had a PCN reaction causing immediate rash,  facial/tongue/throat swelling, SOB or lightheadedness with hypotension:NO Has patient had a PCN reaction causing severe rash involving mucus membranes or skin necrosis:NO Has patient had a PCN reaction that required hospitalization NO Has  patient had a PCN reaction occurring within the last 10 years: NO If all of the above answers are "NO", then may proceed with Cephalosporin use.   Latex Rash     Outpatient Medications Prior to Visit  Medication Sig Dispense Refill   acetaminophen (TYLENOL) 325 MG tablet Take by mouth. Acetaminophen Arthritis.     allopurinol (ZYLOPRIM) 100 MG tablet Take 1 tablet by mouth daily.     amLODipine (NORVASC) 10 MG tablet Take 10 mg by mouth daily.     atorvastatin (LIPITOR) 10 MG tablet TAKE 1 TABLET EVERY DAY 90 tablet 3   azelastine (ASTELIN) 0.1 % nasal spray 1-2 sprays each nostril every 12 hours if needed 90 mL 4   carvedilol (COREG) 25 MG tablet Take 1 tablet (25 mg total) by mouth 2 (two) times daily with a meal. 180 tablet 1   Cholecalciferol (VITAMIN D3) 50 MCG (2000 UT) CHEW Chew 2,000 mcg by mouth.     Coenzyme Q10 (COQ10) 200 MG CAPS Take by mouth.     fluticasone (FLONASE) 50 MCG/ACT nasal spray 2 sprays by Each Nare route daily as needed for Rhinitis.     gabapentin (NEURONTIN) 300 MG capsule Take 300 mg by mouth 3 (three) times daily.     GLUTATHIONE PO Take by mouth.     loratadine (CLARITIN) 10 MG tablet Take 10 mg by mouth daily as needed for allergies.     losartan-hydrochlorothiazide (HYZAAR) 50-12.5 MG per tablet Take 1 tablet by mouth 2 (two) times daily. 180 tablet 1   Misc Natural Products (COLON CLEANSE PO) Take by mouth.     polyethylene glycol (MIRALAX / GLYCOLAX) 17 g packet Take 17 g by mouth as needed.      tamsulosin (FLOMAX) 0.4 MG CAPS capsule      Turmeric (QC TUMERIC COMPLEX PO) Take by mouth. Pt reports taking Tumeric root     clotrimazole-betamethasone (LOTRISONE) cream      COVID-19 mRNA bivalent vaccine, Pfizer, (PFIZER  COVID-19 VAC BIVALENT) injection Inject into the muscle. 0.3 mL 0   COVID-19 mRNA Vac-TriS, Pfizer, (PFIZER-BIONT COVID-19 VAC-TRIS) SUSP injection Inject into the muscle. 0.3 mL 0   No facility-administered medications prior to visit.     Radiology:   No results found.  Cardiac Studies:   Echocardiogram 09/19/2018: Left ventricle cavity is normal in size. Normal left ventricular wall thickness. Abnormal septal wall motion due interventricular conduction abnormality.  Doppler evidence of grade I (impaired) diastolic dysfunction, normal LAP. Calculated EF 66%. Left atrial cavity is moderately dilated. Mild to moderate mitral regurgitation. Mild tricuspid regurgitation. Estimated pulmonary artery systolic pressure 20 mmHg. Mild pulmonic valve stenosis. Peak gradient 19 mmHg. No significant change compared to previous study dated 02/20/2018.    Exercise myoview stress 09/10/2018:  1. The patient performed treadmill exercise using Bruce protocol, completing 5:15 minutes. The patient completed an estimated workload of 6.7 METS, reaching 103% of the maximum predicted heart rate. Exercise capacity was low. Hemodynamic response was normal. Stress symptoms included dyspnea. The resting electrocardiogram demonstrated normal sinus rhythm, RBBB + LAHB and secondary ST-T changes in anteroseptal leads.  The stress electrocardiogram showed sinus tachycardia, RBBB + LAHB, occasional PVC's and secondary ST-T changes anteroseptal leads, unchanged compared to resting EKG.  No ischemic changes seen on stress electrocardiogram.  2. The overall quality of the study is good.  Left ventricular cavity is noted to be normal on the rest and stress studies.  Gated SPECT images reveal normal myocardial thickening and  wall motion.  The left ventricular ejection fraction was calculated or visually estimated to be 88%. SPECT rest and stress images reveal decreased tracer uptake in apical, inferior myocardium, likely due to  diaphragmatic attenuation.  3. Low risk study.    Event Monitor for 30 days Start date 08/05/2018: Indications syncope and collapse No symptoms reported.  Maximum heart rate 124 bpm, minimum heart rate 58 bpm, sinus rhythm.  Occasional PACs. No heart block.  Chest x-ray 12/14/2017: Heart size mildly enlarged, pulmonary artery enlargement unchanged from prior study dated 03/20/05/2017, findings consistent with pulmonary artery hypertension.   Ultrasound of the thyroid 06/25/2018: Bilateral thyroid nodules.  Being followed by endocrinology.   Carotid artery duplex 06/19/2018: Minimal heterogeneous calcific plaque but no significant stenosis. No significant change from  Carotid artery duplex 03/18/2014, 07/26/2010.  EKG:   EKG 04/19/2021: Normal sinus rhythm at rate of 72 bpm, left axis deviation, left anterior fascicular block.  Right bundle branch block.  No significant change from 04/17/2020.   Assessment     ICD-10-CM   1. Nonrheumatic pulmonary valve stenosis  I37.0 EKG 12-Lead    2. Essential hypertension  I10     3. Bilateral carotid bruits  R09.89        Medications Discontinued During This Encounter  Medication Reason   clotrimazole-betamethasone (LOTRISONE) cream    COVID-19 mRNA bivalent vaccine, Pfizer, (PFIZER COVID-19 VAC BIVALENT) injection    COVID-19 mRNA Vac-TriS, Pfizer, (PFIZER-BIONT COVID-19 VAC-TRIS) SUSP injection     No orders of the defined types were placed in this encounter.   Recommendations:   Eugene [name] is a 75 y.o. AAM with pulmonary valve stenosis and chronic dyspnea, hypertension, morbid obesity, obstructive sleep apnea and presently not using oral prosthesis, mild pulmonary valve stenosis of non-rheumatic etiology by TEE in 2016 (H/O Rh Fever as child),  presents for annual visit for hypertension and pulmonary stenosis.   This is his annual visit, except for chronic dyspnea on exertion, he has no specific complaints.  No change in his physical  exam or heart murmur.  He has been stable over the last 6 to 8 years with regard to pulmonary stenosis.  No further evaluation is indicated.  He has bilateral carotid artery bruit, again he has had several episodes of carotid duplex which did not reveal any significant abnormality.  Suspect conducted sounds from the pulmonary stenosis which is mild.  He does have obstructive sleep apnea but he has not been on any mouthguard or CPAP, states that he is presently doing well and has not been snoring at home as per his wife.  Would still recommend consideration for repeat sleep study.  With regard to hypertension, blood pressure is well controlled.  Lipids being managed by PCP.  Otherwise stable from cardiac standpoint, as he has remained stable over the last 6 to 8 years, and make his visit as needed.   Adrian Prows, MD, [facility] 04/19/2021, 10:21 AM Office: [phone] Pager: [phone]

## 2021-06-09 ENCOUNTER — Other Ambulatory Visit: Payer: Self-pay | Admitting: Student in an Organized Health Care Education/Training Program

## 2021-06-09 DIAGNOSIS — M48062 Spinal stenosis, lumbar region with neurogenic claudication: Secondary | ICD-10-CM

## 2021-06-28 ENCOUNTER — Ambulatory Visit
Admission: RE | Admit: 2021-06-28 | Discharge: 2021-06-28 | Disposition: A | Discharge status: Home / Self Care | Payer: Medicare HMO | Source: Ambulatory Visit | Attending: Student in an Organized Health Care Education/Training Program | Admitting: Student in an Organized Health Care Education/Training Program

## 2021-06-28 ENCOUNTER — Other Ambulatory Visit: Payer: Self-pay

## 2021-06-28 DIAGNOSIS — M48062 Spinal stenosis, lumbar region with neurogenic claudication: Secondary | ICD-10-CM

## 2022-01-03 ENCOUNTER — Ambulatory Visit: Payer: Medicare HMO | Admitting: Family Medicine

## 2022-01-03 ENCOUNTER — Ambulatory Visit
Admission: RE | Admit: 2022-01-03 | Discharge: 2022-01-03 | Disposition: A | Discharge status: Home / Self Care | Payer: Medicare HMO | Source: Ambulatory Visit | Attending: Family Medicine | Admitting: Family Medicine

## 2022-01-03 ENCOUNTER — Ambulatory Visit: Payer: Self-pay

## 2022-01-03 VITALS — BP 132/62 | Ht 70.0 in | Wt 260.0 lb

## 2022-01-03 DIAGNOSIS — M25511 Pain in right shoulder: Secondary | ICD-10-CM

## 2022-01-03 DIAGNOSIS — M25562 Pain in left knee: Secondary | ICD-10-CM

## 2022-01-03 MED ORDER — METHYLPREDNISOLONE ACETATE 40 MG/ML IJ SUSP
40.0000 mg | Freq: Once | INTRAMUSCULAR | Status: AC
  Administered 2022-01-03: 40 mg via INTRA_ARTICULAR

## 2022-01-03 NOTE — Assessment & Plan Note (Signed)
6-week duration, appears to be arthritic in nature based on HPI and physical exam.  No new x-rays ordered as would not change management at this time.  Knee injected with lidocaine and steroid.  Return as needed.

## 2022-01-03 NOTE — Patient Instructions (Addendum)
Your left knee pain is due to arthritis. These are the different medications you can take for this: Tylenol '500mg'$  1-2 tabs three times a day for pain. Capsaicin, aspercreme, or biofreeze topically up to four times a day may also help with pain. Some supplements that may help for arthritis: Boswellia extract, curcumin, pycnogenol Aleve 1-2 tabs twice a day with food if needed. Cortisone injections are an option - you were given this today. If cortisone injections do not help, there are different types of shots that may help but they take longer to take effect. It's important that you continue to stay active. Straight leg raises, knee extensions 3 sets of 10 once a day (add ankle weight if these become too easy). Consider physical therapy to strengthen muscles around the joint that hurts to take pressure off of the joint itself. Shoe inserts with good arch support may be helpful. Heat or ice 15 minutes at a time 3-4 times a day as needed to help with pain. Water aerobics and cycling with low resistance are the best two types of exercise for arthritis though any exercise is ok as long as it doesn't worsen the pain. Follow up with me in 5-6 weeks.   We will go ahead with an MRI of your right shoulder - I'm concerned you may have a new rotator cuff tear. I will call you with the results and next steps.

## 2022-01-03 NOTE — Progress Notes (Unsigned)
SUBJECTIVE:   CHIEF COMPLAINT / HPI:   Mr. Liew is a pleasant 76 year old man who presents to [place] today for evaluation of right shoulder and left knee pains.  Right shoulder pain Patient reports sudden onset of right shoulder pain the morning of July 10, noted immediately upon waking.  He notes that he had just received a couple injections into his spine, last of which was on 7/05.  The morning of July 10, noted right shoulder pain on the top and lateral side of the shoulder.  Pain intermittently radiates down to the level of the elbow or level of hand.  Denies recent trauma.  No numbness, tingling, or pins-and-needles of right arm or hand.  Aggravating factors include first thing in the morning upon waking (especially if he has slept on his right side by accident) and attempting to reach up over his head.  Relieving factors include arthritis patch, Biofreeze, and Tylenol arthritis which all provide moderate relief.  No history of excessive shoulder use such as history of baseball pitcher.  He does report long history of factory work, but none that he believes repeatedly overexerted his right shoulder.   Left knee pain Patient reports left knee pain that started on July 8, he feels as though there is a "ligament out of place".  Denies any recent injuries, falls, or episodes where his knee gives out.  He has been walking with a cane to "keep pressure off of it".  He describes a dull anterior knee pain that changes location.  Sometimes it is superior or inferior to patella, other times it is along the joint line either laterally or medially.  Aggravating factors include weightbearing such as rising from chair, standing, and walking.  Relieving factors include arthritis patches, Biofreeze, and Tylenol arthritis which provide modest relief.  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, PAH, pulmonary valve stenosis  OBJECTIVE:   BP 132/62   Ht '5\' 10"'$  (1.778 m)   Wt 260 lb (117.9 kg)   BMI 37.31 kg/m    Right shoulder Inspection and palpation unremarkable, equal to left shoulder Limited ROM with Abduction, cannot raise higher than 90 degrees BUE and grip strength 5/5 and equal bilaterally Neurovascularly intact bilaterally Mild TTP over AC joint, no tenderness elsewhere Right shoulder painful on resisted internal and external rotation Neer's test positive Cross body impingement test positive Empty can test positive for pain and weakness to resistance   Complete right shoulder ultrasound performed by Dr. Karlton Lemon.  Based on ultrasound, concern for full-thickness supraspinatus tear.  However, ultrasound complicated by previous rotator cuff injury and surgical repair.  Left knee Inspection and palpation reveal slightly more swollen than right knee Left knee with normal overlying skin; no erythema or warmth Knee is tender to palpation over the lateral joint line No tenderness to palpation over patellar tendon, quad tendon, patella, or medial joint line No knee pain with varus/valgus stress or internal/external rotation Thessaly negative   PROCEDURE:   KNEE INJECTION  Patient was given informed consent, signed copy in the chart. Appropriate time out was taken. Area prepped in usual fashion. Ethyl chloride was used for local anesthesia. A 21 gauge 1 1/2 inch needle was used. 1 cc of methylprednisolone 40 mg/ml plus 4 cc of 1% lidocaine without epinephrine was injected into the knee using a medial  approach.   The patient tolerated the procedure well. There were no complications. Post procedure instructions were given.   ASSESSMENT/PLAN:   Pain in joint of right shoulder Subacute, 6-week duration.  Based on physical exam and ultrasound today, concern for rotator cuff tear, likely  supraspinatus.  Ultrasound is complicated by previous rotator cuff injury and surgical repair.  Plan to send for MRI with referral for surgical follow-up as indicated.  Acute pain of left knee 6-week duration, appears to be arthritic in nature based on HPI and physical exam.  No new x-rays ordered as would not change management at this time.  Knee injected with lidocaine and steroid.  Return as needed.     Ezequiel Essex, MD [place]

## 2022-01-03 NOTE — Assessment & Plan Note (Signed)
Subacute, 6-week duration.  Based on physical exam and ultrasound today, concern for rotator cuff tear, likely supraspinatus.  Ultrasound is complicated by previous rotator cuff injury and surgical repair.  Plan to send for MRI with referral for surgical follow-up as indicated.

## 2022-01-04 ENCOUNTER — Encounter: Payer: Self-pay | Admitting: Family Medicine

## 2022-01-06 ENCOUNTER — Encounter: Payer: Self-pay | Admitting: *Deleted

## 2022-01-16 ENCOUNTER — Ambulatory Visit
Admission: RE | Admit: 2022-01-16 | Discharge: 2022-01-16 | Disposition: A | Discharge status: Home / Self Care | Payer: Medicare HMO | Source: Ambulatory Visit | Attending: Family Medicine | Admitting: Family Medicine

## 2022-01-16 DIAGNOSIS — M25511 Pain in right shoulder: Secondary | ICD-10-CM

## 2022-02-07 ENCOUNTER — Ambulatory Visit: Payer: Medicare HMO | Admitting: Family Medicine

## 2022-02-07 VITALS — BP 124/60 | Ht 70.0 in

## 2022-02-07 DIAGNOSIS — M25511 Pain in right shoulder: Secondary | ICD-10-CM

## 2022-02-07 NOTE — Patient Instructions (Signed)
Start physical therapy for at least a couple visits and do home exercises on days you don't go to therapy. Consider steroid injection if you're struggling with the pain. Follow up with me in 5-6 weeks.

## 2022-02-07 NOTE — Progress Notes (Unsigned)
  Eugene [name] - 76 y.o. male MRN [id number]  Date of birth: 01-Oct-1945    CHIEF COMPLAINT:       SUBJECTIVE:   HPI:   Knee: Knee is doing better, not bothering him much. Every once in a while he'll have some pinching sensation in knee.   Shoulder: Still having issues getting arm above head, continues to be sore. Pain located on outside of shoulder, that gets worse with activity. Has been taking tylenol arthritis and gabapentin for pain. Still painful to sleep on, patient tires to avoid laying on shoulder. Pain at rest and worse with activity.    ROS:     See HPI  PERTINENT  PMH / PSH FH / / SH:  Past Medical, Surgical, Social, and Family History Reviewed & Updated in the EMR.  Pertinent findings include:    OBJECTIVE: BP 124/60   Ht '5\' 10"'$  (1.778 m)   BMI 37.31 kg/m   Physical Exam:  Vital signs are reviewed.  GEN: Alert and oriented, NAD Pulm: Breathing unlabored PSY: normal mood, congruent affect  MSK: Rt. Shoulder: no deformity, swelling, bruising.  No TTP over shoulder, patient could barely raise arm to 90 degrees, pain with resisted internal rotation, no pain with resisted external rotation. Notable weakness with empty can testing and external rotation, negative liftoff.  ASSESSMENT & PLAN:  1. Rotator Cuff Injury Patient suffering from rotator cuff injuries to Supraspinatus, Infraspinatus, and possibly Subscapularis. Patient with notable weakness and pain on exam, localizing to supraspinatus and infraspinatus - MRI demonstrated full thickness tears here with atrophy. Patient pain is notable, but patient not wanting joint injection at this time. Patient will attempt PT, and may consider joint injection/shoulder replacement in future if necessary.  -PT   Holley Bouche, MD PGY-2, [facility] resident [place]

## 2022-02-08 ENCOUNTER — Encounter: Payer: Self-pay | Admitting: Family Medicine

## 2022-02-10 ENCOUNTER — Other Ambulatory Visit: Payer: Self-pay | Admitting: Cardiology

## 2022-02-10 DIAGNOSIS — I6523 Occlusion and stenosis of bilateral carotid arteries: Secondary | ICD-10-CM

## 2022-02-15 NOTE — Therapy (Unsigned)
OUTPATIENT PHYSICAL THERAPY SHOULDER EVALUATION   Patient Name: Eugene [name] MRN: [id number] DOB:[birth year], 76 y.o., male Today's Date: 02/16/2022    Past Medical History:  Diagnosis Date   Allergic rhinitis due to pollen    Allergy    seasonal   Anxiety    Blood transfusion without reported diagnosis    do not except blood transfusions.   Cataract    beginning stage   DDD (degenerative disc disease), cervical    Severe w/ osteophytes on xray 3/16   ED (erectile dysfunction)    GERD (gastroesophageal reflux disease)    Heart murmur    History of Holter monitoring    started on march 19,2020   Hypertension    OBESITY    Osteoarthritis    Prostate cancer ([place])    Pulmonary stenosis, valvar    TEE 07/2011: stable, LVEF 55%, small PFO   Refusal of blood transfusions as patient is Jehovah's Witness    Rheumatic fever    residual pulm stenosis, follows with ganji every 20mo  RHINITIS, CHRONIC    SLEEP APNEA, OBSTRUCTIVE dx 2000   NPSG 2000:  AHI 76/hr CPAP titrated to 11cm 2002    Past Surgical History:  Procedure Laterality Date   COLONOSCOPY     PROSTATE BIOPSY     ROTATOR CUFF REPAIR     2018   TEE WITHOUT CARDIOVERSION  08/02/2011   Procedure: TRANSESOPHAGEAL ECHOCARDIOGRAM (TEE);  Surgeon: JLaverda Page MD;  [facility]  Service: Cardiovascular;  Laterality: N/A;   Patient Active Problem List   Diagnosis Date Noted   Acute pain of left knee 01/03/2022   Pain in joint of right shoulder 01/03/2022   Lumbar radiculopathy, chronic 03/26/2021   Malignant [place] of [place] 07/19/2019   Mild tricuspid regurgitation 07/18/2019   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) [place] 07/18/2019   Stage III chronic kidney disease ([place] 07/18/2019   Spinal stenosis of lumbar region at multiple levels 04/08/2019   Syncope and collapse 07/24/2018   Asthma, mild intermittent 08/03/2017   Degenerative cervical disc 08/05/2014   Routine general medical  examination at a health care facility 07/22/2014   RBBB with left anterior fascicular block    Pulmonary stenosis, valvar    ED (erectile dysfunction) 03/22/2011   Osteoarthritis 03/01/2011   Seasonal and perennial allergic rhinitis 03/01/2011   Obesity 04/21/2008   Obstructive sleep apnea 04/21/2008   Essential hypertension 04/21/2008   CARDIAC MURMUR 04/21/2008    PCP: OBuzzy Han MD  REFERRING PROVIDER: HDene Gentry MD   REFERRING DIAG: M25.511 (ICD-10-CM) - Pain in joint of right shoulder   THERAPY DIAG: R RC tear   Rationale for Evaluation and Treatment Rehabilitation  ONSET DATE: 8 weeks ago  SUBJECTIVE:  SUBJECTIVE STATEMENT: Still having issues getting arm above head, continues to be sore. Pain located on outside of shoulder, that gets worse with activity. Has been taking tylenol arthritis and gabapentin for pain. Still painful to sleep on, patient tires to avoid laying on shoulder. Pain at rest and worse with activity.     PERTINENT HISTORY: 1. Rotator Cuff Injury Patient suffering from rotator cuff injuries to Supraspinatus, Infraspinatus, and possibly Subscapularis. Patient with notable weakness and pain on exam, localizing to supraspinatus and infraspinatus - MRI demonstrated full thickness tears here with atrophy. Patient pain is notable, but patient not wanting joint injection at this time. Patient will attempt PT, and may consider joint injection/shoulder replacement in future if necessary.  -PT  PAIN:  Are you having pain? Yes: NPRS scale: 8/10 Pain location: R shoulder Pain description: ache Aggravating factors: abduction and OH motion Relieving factors: rest  PRECAUTIONS: Other: R RC tear  WEIGHT BEARING RESTRICTIONS No  FALLS:  Has patient fallen in last 6  months? No  LIVING ENVIRONMENT: Lives with: lives with their family   OCCUPATION: retired  PLOF: Independent  PATIENT GOALS To get more function out of my shoulder  OBJECTIVE:   DIAGNOSTIC FINDINGS:  CLINICAL DATA:  Right shoulder pain over the last 6 weeks   EXAM: MRI OF THE RIGHT SHOULDER WITHOUT CONTRAST   TECHNIQUE: Multiplanar, multisequence MR imaging of the shoulder was performed. No intravenous contrast was administered.   COMPARISON:  Radiographs 01/03/2022   FINDINGS: Despite efforts by the technologist and patient, motion artifact is present on today's exam and could not be eliminated. This reduces exam sensitivity and specificity.   Rotator cuff: Full-thickness full width retracted rupture of the supraspinatus tendon, retracted back about 5 cm. Full-thickness full width rupture of the infraspinatus tendon, retracted about 2 cm. Prominent distal subscapularis tendinopathy likely with partial thickness articular surface tearing.   Muscles:  Atrophic supraspinatus and infraspinatus muscles.   Biceps long head: Intra-articular portion not visualized compatible with rupture, biceps tenodesis, or biceps tenotomy.   Acromioclavicular Joint: Possible acromioplasty or distal clavicular resection, with a 1.3 cm gap at the [facility] joint which is fluid-filled and communicates with the subacromial subdeltoid bursa. Type II acromion. There is extensive fluid in the subacromial subdeltoid bursa freely communicating with the glenohumeral joint.   Glenohumeral Joint: Prominent degenerative glenohumeral arthropathy with chondral thinning, moderate spurring, and subcortical marrow edema and degenerative subcortical cyst formation along the glenoid especially superiorly but also posteriorly and anteriorly. Glenohumeral joint effusion communicates with the subacromial subdeltoid bursa.   Labrum: Exophytic cystic lesion from the posterosuperior labrum measures 1.2 by 0.7 by  1.1 cm and is probably a paralabral cyst.   Bones:  Tracks from prior rotator cuff anchors noted.   Other: Right axillary lymph nodes are partially included on today's examination and could measure up to 1.4 cm in short axis for example on image 2 series 9.   IMPRESSION: 1. Full-thickness full width ruptures of the supraspinatus and infraspinatus tendons with associated muscular atrophy. Prominent distal subscapularis tendinopathy likely with partial thickness articular surface tearing. 2. Nonvisualization of the long head of the biceps, query rupture or tenotomy. 3. Joint effusion freely communicating with the subacromial subdeltoid bursa. 4. Probable paralabral cyst along the posterosuperior labrum. 5. Possible right axillary adenopathy.     Electronically Signed   By: Van Clines M.D.   On: 01/19/2022 07:49  PATIENT SURVEYS:  FOTO 46(65 predicted)  COGNITION:  Overall cognitive status: Within functional limits  for tasks assessed     SENSATION: Not tested  POSTURE: Depressed forward shoulders  UPPER EXTREMITY ROM:   A/PROM Right eval Left eval  Shoulder flexion 90   Shoulder extension 30   Shoulder abduction 95   Shoulder adduction    Shoulder internal rotation    Shoulder external rotation    Elbow flexion WNL   Elbow extension WNL   Wrist flexion WNL   Wrist extension WNL   Wrist ulnar deviation    Wrist radial deviation    Wrist pronation    Wrist supination    (Blank rows = not tested)  UPPER EXTREMITY MMT:  MMT Right eval Left eval  Shoulder flexion 3   Shoulder extension 4   Shoulder abduction 3   Shoulder adduction    Shoulder internal rotation 4   Shoulder external rotation 3   Middle trapezius    Lower trapezius    Elbow flexion    Elbow extension    Wrist flexion    Wrist extension    Wrist ulnar deviation    Wrist radial deviation    Wrist pronation    Wrist supination    Grip strength (lbs)    (Blank rows = not  tested)  SHOULDER SPECIAL TESTS:  Positive drop arm/open can/horn blowers tests  JOINT MOBILITY TESTING:  Deferred based on imaging studies  PALPATION:  deferred   TODAY'S TREATMENT:  Eval and HEP   PATIENT EDUCATION: Education details: Discussed eval findings, rehab rationale and POC and patient is in agreement  Person educated: Patient Education method: Explanation Education comprehension: verbalized understanding and needs further education   HOME EXERCISE PROGRAM: Access Code: [email or web address] URL: [email or web address] Date: 02/16/2022 Prepared by: Sharlynn Oliphant  Exercises - Supine Single Arm Press with Dumbbell  - 1 x daily - 5 x weekly - 3 sets - 10 reps - Seated Single Arm Shoulder Horizontal Abduction and Adduction  - 1 x daily - 5 x weekly - 3 sets - 10 reps - Scaption Wall Slide with Towel  - 1 x daily - 5 x weekly - 3 sets - 10 reps - Shoulder External Rotation and Scapular Retraction with Resistance  - 1 x daily - 5 x weekly - 3 sets - 10 reps  ASSESSMENT:  CLINICAL IMPRESSION: Patient is a 76 y.o. male who was seen today for physical therapy evaluation and treatment for R shoulder RC tear resulting in pain, weakness and decreased function.  He has been informed he most likely needs a TSA due to extent of soft tissue injury as well as previous R RC surgery   OBJECTIVE IMPAIRMENTS decreased knowledge of condition, decreased ROM, decreased strength, impaired UE functional use, postural dysfunction, and pain.   ACTIVITY LIMITATIONS carrying, lifting, and reach over head  PERSONAL FACTORS Age, Past/current experiences, and 1 comorbidity: prior R RC repair  are also affecting patient's functional outcome.   REHAB POTENTIAL: Fair based on extent of soft tissue damage  CLINICAL DECISION MAKING: Evolving/moderate complexity  EVALUATION COMPLEXITY: Low   GOALS: Goals reviewed with patient? No  SHORT TERM GOALS=LONG TERM GOALS: Target date: 03/16/2022     Patient to demonstrate independence in HEP  Baseline: [id number] Goal status: INITIAL  2.  Increase shoulder flexion, abduction and ER strength to 3+/5 Baseline:  MMT Right eval  Shoulder flexion 3  Shoulder extension 4  Shoulder abduction 3  Shoulder adduction   Shoulder internal rotation 4  Shoulder external rotation 3   Goal status: INITIAL  3.  Increase FOTO score to 65 Baseline: 46 Goal status: INITIAL  4.  Increase AROM to 120d flexion and abduction Baseline:  A/PROM Right eval  Shoulder flexion 90  Shoulder extension 30  Shoulder abduction 95   Goal status: INITIAL      PLAN: PT FREQUENCY: 1-2x/week  PT DURATION: 4 weeks  PLANNED INTERVENTIONS: Therapeutic exercises, Therapeutic activity, Neuromuscular re-education, Balance training, Gait training, Patient/Family education, Self Care, Joint mobilization, DME instructions, Manual therapy, and Re-evaluation  PLAN FOR NEXT SESSION: HEP review and update, focus on abduction and flexion strength, CKC tasks for R shoulder function   Lanice Shirts, PT 02/16/2022, 10:09 AM   Referring diagnosis? R RC tear Treatment diagnosis? (if different than referring diagnosis) R RC tear What was this (referring dx) caused by? '[]'$  Surgery '[]'$  Fall '[]'$  Ongoing issue '[]'$  Arthritis '[x]'$  Other: non-traumatic injury  Laterality: '[x]'$  Rt '[]'$  Lt '[]'$  Both  Check all possible CPT codes:  *CHOOSE 10 OR LESS*    '[x]'$  97110 (Therapeutic Exercise)  '[]'$  92507 (SLP Treatment)  '[x]'$  97112 (Neuro Re-ed)   '[]'$  92526 (Swallowing Treatment)   '[x]'$  97116 (Gait Training)   '[]'$  [id number] (Cognitive Training, 1st 15 minutes) '[x]'$  97140 (Manual Therapy)   '[]'$  97130 (Cognitive Training, each add'l 15 minutes)  '[x]'$  97164 (Re-evaluation)                              '[]'$  Other, List CPT Code ____________  '[x]'$  06301 (Therapeutic Activities)     '[x]'$  97535 (Self Care)   '[x]'$  All codes above (97110 - 97535)  '[]'$  97012 (Mechanical Traction)  '[]'$  97014 (E-stim  Unattended)  '[]'$  97032 (E-stim manual)  '[]'$  97033 (Ionto)  '[]'$  97035 (Ultrasound) '[]'$  97750 (Physical Performance Training) '[]'$  [id number] (Aquatic Therapy) '[]'$  97016 (Vasopneumatic Device) '[]'$  [id number] (Paraffin) '[]'$  97034 (Contrast Bath) '[]'$  97597 (Wound Care 1st 20 sq cm) '[]'$  97598 (Wound Care each add'l 20 sq cm) '[]'$  97760 (Orthotic Fabrication, Fitting, Training Initial) '[]'$  [id number] (Prosthetic Management and Training Initial) '[]'$  [id number] (Orthotic or Prosthetic Training/ Modification Subsequent)

## 2022-02-16 ENCOUNTER — Ambulatory Visit: Payer: Medicare HMO | Attending: Family Medicine

## 2022-02-16 DIAGNOSIS — M75121 Complete rotator cuff tear or rupture of right shoulder, not specified as traumatic: Secondary | ICD-10-CM | POA: Diagnosis not present

## 2022-02-16 DIAGNOSIS — M25511 Pain in right shoulder: Secondary | ICD-10-CM | POA: Diagnosis present

## 2022-02-16 DIAGNOSIS — M6281 Muscle weakness (generalized): Secondary | ICD-10-CM | POA: Diagnosis present

## 2022-02-21 ENCOUNTER — Ambulatory Visit: Payer: Medicare HMO

## 2022-02-21 DIAGNOSIS — M75121 Complete rotator cuff tear or rupture of right shoulder, not specified as traumatic: Secondary | ICD-10-CM

## 2022-02-21 DIAGNOSIS — M25511 Pain in right shoulder: Secondary | ICD-10-CM

## 2022-02-21 DIAGNOSIS — M6281 Muscle weakness (generalized): Secondary | ICD-10-CM

## 2022-02-21 NOTE — Therapy (Signed)
OUTPATIENT PHYSICAL THERAPY TREATMENT NOTE   Patient Name: Eugene [name] MRN: [id number] DOB:06-09-1945, 76 y.o., male Today's Date: 02/21/2022  PCP: [name], MD REFERRING PROVIDER: Dene Gentry, MD   END OF SESSION:   PT End of Session - 02/21/22 1534     Visit Number 2    Number of Visits 4    Date for PT Re-Evaluation 04/13/22    Authorization Type [employer]    PT Start Time 0947    PT Stop Time 1610    PT Time Calculation (min) 40 min    Activity Tolerance Patient tolerated treatment well    Behavior During Therapy WFL for tasks assessed/performed             Past Medical History:  Diagnosis Date   Allergic rhinitis due to pollen    Allergy    seasonal   Anxiety    Blood transfusion without reported diagnosis    do not except blood transfusions.   Cataract    beginning stage   DDD (degenerative disc disease), cervical    Severe w/ osteophytes on xray 3/16   ED (erectile dysfunction)    GERD (gastroesophageal reflux disease)    Heart murmur    History of Holter monitoring    started on march 19,2020   Hypertension    OBESITY    Osteoarthritis    Prostate cancer ([place])    Pulmonary stenosis, valvar    TEE 07/2011: stable, LVEF 55%, small PFO   Refusal of blood transfusions as patient is Jehovah's Witness    Rheumatic fever    residual pulm stenosis, follows with ganji every 71mo  RHINITIS, CHRONIC    SLEEP APNEA, OBSTRUCTIVE dx 2000   NPSG 2000:  AHI 76/hr CPAP titrated to 11cm 2002    Past Surgical History:  Procedure Laterality Date   COLONOSCOPY     PROSTATE BIOPSY     ROTATOR CUFF REPAIR     2018   TEE WITHOUT CARDIOVERSION  08/02/2011   Procedure: TRANSESOPHAGEAL ECHOCARDIOGRAM (TEE);  Surgeon: JLaverda Page MD;  [facility]  Service: Cardiovascular;  Laterality: N/A;   Patient Active Problem List   Diagnosis Date Noted   Acute pain of left knee 01/03/2022   Pain in joint of right shoulder 01/03/2022   Lumbar  radiculopathy, chronic 03/26/2021   [place]   Mild tricuspid regurgitation 07/18/2019   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) [place] 07/18/2019   Stage III chronic kidney disease ([facility] 07/18/2019   Spinal stenosis of lumbar region at multiple levels 04/08/2019   Syncope and collapse 07/24/2018   Asthma, mild intermittent 08/03/2017   Degenerative cervical disc 08/05/2014   Routine general medical examination at a health care facility 07/22/2014   RBBB with left anterior fascicular block    Pulmonary stenosis, valvar    ED (erectile dysfunction) 03/22/2011   Osteoarthritis 03/01/2011   Seasonal and perennial allergic rhinitis 03/01/2011   Obesity 04/21/2008   Obstructive sleep apnea 04/21/2008   Essential hypertension 04/21/2008   CARDIAC MURMUR 04/21/2008    REFERRING DIAG: M25.511 (ICD-10-CM) - Pain in joint of right shoulder   THERAPY DIAG: R RC tear   Rationale for Evaluation and Treatment Rehabilitation  PERTINENT HISTORY: 1. Rotator Cuff Injury Patient suffering from rotator cuff injuries to Supraspinatus, Infraspinatus, and possibly Subscapularis. Patient with notable weakness and pain on exam, localizing to supraspinatus and infraspinatus - MRI demonstrated full thickness tears here with atrophy. Patient pain is notable,  but patient not wanting joint injection at this time. Patient will attempt PT, and may consider joint injection/shoulder replacement in future if necessary.  -PT  PRECAUTIONS: R RC tear  SUBJECTIVE: No changes to note, has been compliant with HEP  PAIN:  Are you having pain? Yes: NPRS scale: 5/10 Pain location: R shoulder Pain description: ache Aggravating factors: activity  Relieving factors: rest   OBJECTIVE: (objective measures completed at initial evaluation unless otherwise dated)   DIAGNOSTIC FINDINGS:  CLINICAL DATA:  Right shoulder pain over the last 6 weeks   EXAM: MRI OF THE RIGHT SHOULDER  WITHOUT CONTRAST   TECHNIQUE: Multiplanar, multisequence MR imaging of the shoulder was performed. No intravenous contrast was administered.   COMPARISON:  Radiographs 01/03/2022   FINDINGS: Despite efforts by the technologist and patient, motion artifact is present on today's exam and could not be eliminated. This reduces exam sensitivity and specificity.   Rotator cuff: Full-thickness full width retracted rupture of the supraspinatus tendon, retracted back about 5 cm. Full-thickness full width rupture of the infraspinatus tendon, retracted about 2 cm. Prominent distal subscapularis tendinopathy likely with partial thickness articular surface tearing.   Muscles:  Atrophic supraspinatus and infraspinatus muscles.   Biceps long head: Intra-articular portion not visualized compatible with rupture, biceps tenodesis, or biceps tenotomy.   Acromioclavicular Joint: Possible acromioplasty or distal clavicular resection, with a 1.3 cm gap at the [facility] joint which is fluid-filled and communicates with the subacromial subdeltoid bursa. Type II acromion. There is extensive fluid in the subacromial subdeltoid bursa freely communicating with the glenohumeral joint.   Glenohumeral Joint: Prominent degenerative glenohumeral arthropathy with chondral thinning, moderate spurring, and subcortical marrow edema and degenerative subcortical cyst formation along the glenoid especially superiorly but also posteriorly and anteriorly. Glenohumeral joint effusion communicates with the subacromial subdeltoid bursa.   Labrum: Exophytic cystic lesion from the posterosuperior labrum measures 1.2 by 0.7 by 1.1 cm and is probably a paralabral cyst.   Bones:  Tracks from prior rotator cuff anchors noted.   Other: Right axillary lymph nodes are partially included on today's examination and could measure up to 1.4 cm in short axis for example on image 2 series 9.   IMPRESSION: 1. Full-thickness full width  ruptures of the supraspinatus and infraspinatus tendons with associated muscular atrophy. Prominent distal subscapularis tendinopathy likely with partial thickness articular surface tearing. 2. Nonvisualization of the long head of the biceps, query rupture or tenotomy. 3. Joint effusion freely communicating with the subacromial subdeltoid bursa. 4. Probable paralabral cyst along the posterosuperior labrum. 5. Possible right axillary adenopathy.     Electronically Signed   By: Van Clines M.D.   On: 01/19/2022 07:49   PATIENT SURVEYS:  FOTO 46(65 predicted)   COGNITION:           Overall cognitive status: Within functional limits for tasks assessed                                  SENSATION: Not tested   POSTURE: Depressed forward shoulders   UPPER EXTREMITY ROM:    A/PROM Right eval Left eval  Shoulder flexion 90    Shoulder extension 30    Shoulder abduction 95    Shoulder adduction      Shoulder internal rotation      Shoulder external rotation      Elbow flexion WNL    Elbow extension WNL    Wrist flexion  WNL    Wrist extension WNL    Wrist ulnar deviation      Wrist radial deviation      Wrist pronation      Wrist supination      (Blank rows = not tested)   UPPER EXTREMITY MMT:   MMT Right eval Left eval  Shoulder flexion 3    Shoulder extension 4    Shoulder abduction 3    Shoulder adduction      Shoulder internal rotation 4    Shoulder external rotation 3    Middle trapezius      Lower trapezius      Elbow flexion      Elbow extension      Wrist flexion      Wrist extension      Wrist ulnar deviation      Wrist radial deviation      Wrist pronation      Wrist supination      Grip strength (lbs)      (Blank rows = not tested)   SHOULDER SPECIAL TESTS:            Positive drop arm/open can/horn blowers tests   JOINT MOBILITY TESTING:  Deferred based on imaging studies   PALPATION:  deferred             TODAY'S TREATMENT:   [facility] Adult PT Treatment:                                                DATE: 02/21/22 Therapeutic Exercise: UBE L1 6 min Supine press 1000g ball 15x Supine flexion 1000g ball 15x Sidelie abduction 15x Seated shoulder flexion 500g ball 15x pronated position Seated shoulder abduction 500g ball 15x pronated position Standing rows RTB 15x Standing extension RTB 15x Standing ball roll flexion 15x Manual Therapy: Supine rhythmic stabilization 60/90/120d flexion 60s duration PNF D1 F/E 15x      PATIENT EDUCATION: Education details: Discussed eval findings, rehab rationale and POC and patient is in agreement  Person educated: Patient Education method: Explanation Education comprehension: verbalized understanding and needs further education     HOME EXERCISE PROGRAM: Access Code: [email or web address] URL: [email or web address] Date: 02/16/2022 Prepared by: Sharlynn Oliphant   Exercises - Supine Single Arm Press with Dumbbell  - 1 x daily - 5 x weekly - 3 sets - 10 reps - Seated Single Arm Shoulder Horizontal Abduction and Adduction  - 1 x daily - 5 x weekly - 3 sets - 10 reps - Scaption Wall Slide with Towel  - 1 x daily - 5 x weekly - 3 sets - 10 reps - Shoulder External Rotation and Scapular Retraction with Resistance  - 1 x daily - 5 x weekly - 3 sets - 10 reps   ASSESSMENT:   CLINICAL IMPRESSION: Todays session focused on R shoulder strengthening with concentration on flexion and abduction, incorporated posterior shoulder strengthening.  Included CKC activities to promote mobility and stabilization. Able to tolerate all tasks w/o increased symptoms.     OBJECTIVE IMPAIRMENTS decreased knowledge of condition, decreased ROM, decreased strength, impaired UE functional use, postural dysfunction, and pain.    ACTIVITY LIMITATIONS carrying, lifting, and reach over head   PERSONAL FACTORS Age, Past/current experiences, and 1 comorbidity: prior R RC repair  are also affecting  patient's functional outcome.    REHAB POTENTIAL: Fair  based on extent of soft tissue damage   CLINICAL DECISION MAKING: Evolving/moderate complexity   EVALUATION COMPLEXITY: Low     GOALS: Goals reviewed with patient? No   SHORT TERM GOALS=LONG TERM GOALS: Target date: 03/16/2022     Patient to demonstrate independence in HEP  Baseline: [id number] Goal status: INITIAL   2.  Increase shoulder flexion, abduction and ER strength to 3+/5 Baseline:  MMT Right eval  Shoulder flexion 3  Shoulder extension 4  Shoulder abduction 3  Shoulder adduction    Shoulder internal rotation 4  Shoulder external rotation 3    Goal status: INITIAL   3.  Increase FOTO score to 65 Baseline: 46 Goal status: INITIAL   4.  Increase AROM to 120d flexion and abduction Baseline:  A/PROM Right eval  Shoulder flexion 90  Shoulder extension 30  Shoulder abduction 95    Goal status: INITIAL           PLAN: PT FREQUENCY: 1-2x/week   PT DURATION: 4 weeks   PLANNED INTERVENTIONS: Therapeutic exercises, Therapeutic activity, Neuromuscular re-education, Balance training, Gait training, Patient/Family education, Self Care, Joint mobilization, DME instructions, Manual therapy, and Re-evaluation   PLAN FOR NEXT SESSION: HEP review and update, focus on abduction and flexion strength, CKC tasks for R shoulder function    Lanice Shirts, PT 02/21/2022, 4:12 PM

## 2022-03-02 ENCOUNTER — Ambulatory Visit: Payer: Medicare HMO

## 2022-03-02 DIAGNOSIS — M75121 Complete rotator cuff tear or rupture of right shoulder, not specified as traumatic: Secondary | ICD-10-CM

## 2022-03-02 DIAGNOSIS — M6281 Muscle weakness (generalized): Secondary | ICD-10-CM

## 2022-03-02 DIAGNOSIS — M25511 Pain in right shoulder: Secondary | ICD-10-CM

## 2022-03-02 NOTE — Therapy (Signed)
OUTPATIENT PHYSICAL THERAPY TREATMENT NOTE   Patient Name: Eugene [name] MRN: [id number] DOB:Jan 27, 1946, 76 y.o., male Today's Date: 03/02/2022  PCP: [name], MD REFERRING PROVIDER: Dene Gentry, MD   END OF SESSION:   PT End of Session - 03/02/22 1126     Visit Number 3    Number of Visits 4    Date for PT Re-Evaluation 04/13/22    Authorization Type [employer]    PT Start Time 1130    PT Stop Time 1210    PT Time Calculation (min) 40 min    Activity Tolerance Patient tolerated treatment well    Behavior During Therapy WFL for tasks assessed/performed             Past Medical History:  Diagnosis Date   Allergic rhinitis due to pollen    Allergy    seasonal   Anxiety    Blood transfusion without reported diagnosis    do not except blood transfusions.   Cataract    beginning stage   DDD (degenerative disc disease), cervical    Severe w/ osteophytes on xray 3/16   ED (erectile dysfunction)    GERD (gastroesophageal reflux disease)    Heart murmur    History of Holter monitoring    started on march 19,2020   Hypertension    OBESITY    Osteoarthritis    Prostate cancer ([place])    Pulmonary stenosis, valvar    TEE 07/2011: stable, LVEF 55%, small PFO   Refusal of blood transfusions as patient is Jehovah's Witness    Rheumatic fever    residual pulm stenosis, follows with ganji every 81mo  RHINITIS, CHRONIC    SLEEP APNEA, OBSTRUCTIVE dx 2000   NPSG 2000:  AHI 76/hr CPAP titrated to 11cm 2002    Past Surgical History:  Procedure Laterality Date   COLONOSCOPY     PROSTATE BIOPSY     ROTATOR CUFF REPAIR     2018   TEE WITHOUT CARDIOVERSION  08/02/2011   Procedure: TRANSESOPHAGEAL ECHOCARDIOGRAM (TEE);  Surgeon: JLaverda Page MD;  Location: [facility]  Service: Cardiovascular;  Laterality: N/A;   Patient Active Problem List   Diagnosis Date Noted   Acute pain of left knee 01/03/2022   Pain in joint of right shoulder 01/03/2022    Lumbar radiculopathy, chronic 03/26/2021   [place]alignant [place] 07/19/2019   Mild tricuspid regurgitation 07/18/2019   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) [place] 07/18[place]   Stage III chronic kidney disease [place] 07/18/2019   Spinal stenosis of lumbar region at multiple levels 04/08/2019   Syncope and collapse 07/24/2018   Asthma, mild intermittent 08/03/2017   Degenerative cervical disc 08/05/2014   Routine general medical examination at a health care facility 07/22/2014   RBBB with left anterior fascicular block    Pulmonary stenosis, valvar    ED (erectile dysfunction) 03/22/2011   Osteoarthritis 03/01/2011   Seasonal and perennial allergic rhinitis 03/01/2011   Obesity 04/21/2008   Obstructive sleep apnea 04/21/2008   Essential hypertension 04/21/2008   CARDIAC MURMUR 04/21/2008    REFERRING DIAG: M25.511 (ICD-10-CM) - Pain in joint of right shoulder   THERAPY DIAG: R RC tear   Rationale for Evaluation and Treatment Rehabilitation  PERTINENT HISTORY: 1. Rotator Cuff Injury Patient suffering from rotator cuff injuries to Supraspinatus, Infraspinatus, and possibly Subscapularis. Patient with notable weakness and pain on exam, localizing to supraspinatus and infraspinatus - MRI demonstrated full thickness tears here with atrophy. Patient pain is notable,  but patient not wanting joint injection at this time. Patient will attempt PT, and may consider joint injection/shoulder replacement in future if necessary.  -PT  PRECAUTIONS: R RC tear  SUBJECTIVE:Soreness 5/10, has a bit more motion in R shoulder as he I able to lift arm [place].  PAIN:  Are you having pain? Yes: NPRS scale: 5/10 Pain location: R shoulder Pain description: ache Aggravating factors: activity  Relieving factors: rest   OBJECTIVE: (objective measures completed at initial evaluation unless otherwise dated)   DIAGNOSTIC FINDINGS:  CLINICAL DATA:  Right shoulder pain over the last 6 weeks    EXAM: MRI OF THE RIGHT SHOULDER WITHOUT CONTRAST   TECHNIQUE: Multiplanar, multisequence MR imaging of the shoulder was performed. No intravenous contrast was administered.   COMPARISON:  Radiographs 01/03/2022   FINDINGS: Despite efforts by the technologist and patient, motion artifact is present on today's exam and could not be eliminated. This reduces exam sensitivity and specificity.   Rotator cuff: Full-thickness full width retracted rupture of the supraspinatus tendon, retracted back about 5 cm. Full-thickness full width rupture of the infraspinatus tendon, retracted about 2 cm. Prominent distal subscapularis tendinopathy likely with partial thickness articular surface tearing.   Muscles:  Atrophic supraspinatus and infraspinatus muscles.   Biceps long head: Intra-articular portion not visualized compatible with rupture, biceps tenodesis, or biceps tenotomy.   Acromioclavicular Joint: Possible acromioplasty or distal clavicular resection, with a 1.3 cm gap at the [facility] joint which is fluid-filled and communicates with the subacromial subdeltoid bursa. Type II acromion. There is extensive fluid in the subacromial subdeltoid bursa freely communicating with the glenohumeral joint.   Glenohumeral Joint: Prominent degenerative glenohumeral arthropathy with chondral thinning, moderate spurring, and subcortical marrow edema and degenerative subcortical cyst formation along the glenoid especially superiorly but also posteriorly and anteriorly. Glenohumeral joint effusion communicates with the subacromial subdeltoid bursa.   Labrum: Exophytic cystic lesion from the posterosuperior labrum measures 1.2 by 0.7 by 1.1 cm and is probably a paralabral cyst.   Bones:  Tracks from prior rotator cuff anchors noted.   Other: Right axillary lymph nodes are partially included on today's examination and could measure up to 1.4 cm in short axis for example on image 2 series 9.    IMPRESSION: 1. Full-thickness full width ruptures of the supraspinatus and infraspinatus tendons with associated muscular atrophy. Prominent distal subscapularis tendinopathy likely with partial thickness articular surface tearing. 2. Nonvisualization of the long head of the biceps, query rupture or tenotomy. 3. Joint effusion freely communicating with the subacromial subdeltoid bursa. 4. Probable paralabral cyst along the posterosuperior labrum. 5. Possible right axillary adenopathy.     Electronically Signed   By: Van Clines M.D.   On: 01/19/2022 07:49   PATIENT SURVEYS:  FOTO 46(65 predicted)   COGNITION:           Overall cognitive status: Within functional limits for tasks assessed                                  SENSATION: Not tested   POSTURE: Depressed forward shoulders   UPPER EXTREMITY ROM:    A/PROM Right eval Left eval  Shoulder flexion 90    Shoulder extension 30    Shoulder abduction 95    Shoulder adduction      Shoulder internal rotation      Shoulder external rotation      Elbow flexion WNL  Elbow extension WNL    Wrist flexion WNL    Wrist extension WNL    Wrist ulnar deviation      Wrist radial deviation      Wrist pronation      Wrist supination      (Blank rows = not tested)   UPPER EXTREMITY MMT:   MMT Right eval Left eval  Shoulder flexion 3    Shoulder extension 4    Shoulder abduction 3    Shoulder adduction      Shoulder internal rotation 4    Shoulder external rotation 3    Middle trapezius      Lower trapezius      Elbow flexion      Elbow extension      Wrist flexion      Wrist extension      Wrist ulnar deviation      Wrist radial deviation      Wrist pronation      Wrist supination      Grip strength (lbs)      (Blank rows = not tested)   SHOULDER SPECIAL TESTS:            Positive drop arm/open can/horn blowers tests   JOINT MOBILITY TESTING:  Deferred based on imaging studies   PALPATION:   deferred             TODAY'S TREATMENT:  [facility] Adult PT Treatment:                                                DATE: 03/02/22 Therapeutic Exercise: UBE L1.5 6 min Supine press 1000g ball 20x Supine flexion 1000g ball 20x Sidelie abduction 20x 500g ball Sidelie ER 20x 500g ball Seated shoulder flexion 500g ball 20x pronated position Seated shoulder abduction 500g ball 20x pronated position Standing rows RTB 20x Standing extension RTB 20x Standing ball roll flexion B 20x Manual Therapy: Supine rhythmic stabilization 60/90/120d flexion 60s duration PNF D1 F/E 15x  [facility] Adult PT Treatment:                                                DATE: 02/21/22 Therapeutic Exercise: UBE L1 6 min Supine press 1000g ball 15x Supine flexion 1000g ball 15x Sidelie abduction 15x Seated shoulder flexion 500g ball 15x pronated position Seated shoulder abduction 500g ball 15x pronated position Standing rows RTB 15x Standing extension RTB 15x Standing ball roll flexion 15x Manual Therapy: Supine rhythmic stabilization 60/90/120d flexion 60s duration PNF D1 F/E 15x      PATIENT EDUCATION: Education details: Discussed eval findings, rehab rationale and POC and patient is in agreement  Person educated: Patient Education method: Explanation Education comprehension: verbalized understanding and needs further education     HOME EXERCISE PROGRAM: Access Code: [email or web address] URL: [email or web address] Date: 02/16/2022 Prepared by: Sharlynn Oliphant   Exercises - Supine Single Arm Press with Dumbbell  - 1 x daily - 5 x weekly - 3 sets - 10 reps - Seated Single Arm Shoulder Horizontal Abduction and Adduction  - 1 x daily - 5 x weekly - 3 sets - 10 reps - Scaption Wall Slide with Towel  - 1 x daily - 5 x weekly -  3 sets - 10 reps - Shoulder External Rotation and Scapular Retraction with Resistance  - 1 x daily - 5 x weekly - 3 sets - 10 reps   ASSESSMENT:   CLINICAL IMPRESSION: Showing  improved strength in available ROM, now able to reach [facility] but pure abduction remains difficult due to soreness and substitution patterns.     OBJECTIVE IMPAIRMENTS decreased knowledge of condition, decreased ROM, decreased strength, impaired UE functional use, postural dysfunction, and pain.    ACTIVITY LIMITATIONS carrying, lifting, and reach over head   PERSONAL FACTORS Age, Past/current experiences, and 1 comorbidity: prior R RC repair  are also affecting patient's functional outcome.    REHAB POTENTIAL: Fair based on extent of soft tissue damage   CLINICAL DECISION MAKING: Evolving/moderate complexity   EVALUATION COMPLEXITY: Low     GOALS: Goals reviewed with patient? No   SHORT TERM GOALS=LONG TERM GOALS: Target date: 03/16/2022     Patient to demonstrate independence in HEP  Baseline: [id number] Goal status: INITIAL   2.  Increase shoulder flexion, abduction and ER strength to 3+/5 Baseline:  MMT Right eval  Shoulder flexion 3  Shoulder extension 4  Shoulder abduction 3  Shoulder adduction    Shoulder internal rotation 4  Shoulder external rotation 3    Goal status: INITIAL   3.  Increase FOTO score to 65 Baseline: 46 Goal status: INITIAL   4.  Increase AROM to 120d flexion and abduction Baseline:  A/PROM Right eval  Shoulder flexion 90  Shoulder extension 30  Shoulder abduction 95    Goal status: INITIAL           PLAN: PT FREQUENCY: 1-2x/week   PT DURATION: 4 weeks   PLANNED INTERVENTIONS: Therapeutic exercises, Therapeutic activity, Neuromuscular re-education, Balance training, Gait training, Patient/Family education, Self Care, Joint mobilization, DME instructions, Manual therapy, and Re-evaluation   PLAN FOR NEXT SESSION: HEP review and update, focus on abduction and flexion strength, CKC tasks for R shoulder function    Lanice Shirts, PT 03/02/2022, 12:13 PM

## 2022-03-07 ENCOUNTER — Other Ambulatory Visit (HOSPITAL_BASED_OUTPATIENT_CLINIC_OR_DEPARTMENT_OTHER): Payer: Self-pay

## 2022-03-07 MED ORDER — COMIRNATY 30 MCG/0.3ML IM SUSY
PREFILLED_SYRINGE | INTRAMUSCULAR | 0 refills | Status: DC
  Filled 2022-03-07: qty 0.3, 1d supply, fill #0

## 2022-03-09 ENCOUNTER — Ambulatory Visit: Payer: Medicare HMO

## 2022-03-09 DIAGNOSIS — M6281 Muscle weakness (generalized): Secondary | ICD-10-CM

## 2022-03-09 DIAGNOSIS — M75121 Complete rotator cuff tear or rupture of right shoulder, not specified as traumatic: Secondary | ICD-10-CM

## 2022-03-09 DIAGNOSIS — M25511 Pain in right shoulder: Secondary | ICD-10-CM

## 2022-03-09 NOTE — Therapy (Signed)
OUTPATIENT PHYSICAL THERAPY TREATMENT NOTE/DC SUMMARY   Patient Name: Eugene [name] MRN: [id number] DOB:[birth year], 76 y.o., male Today's Date: 03/09/2022  PCP: [name], MD REFERRING PROVIDER: Dene Gentry, MD  PHYSICAL THERAPY DISCHARGE SUMMARY  Visits from Start of Care: 4  Current functional level related to goals / functional outcomes: Rehab goals met/maximum function regained   Remaining deficits: strength   Education / Equipment: HEP   Patient agrees to discharge. Patient goals were met. Patient is being discharged due to being pleased with the current functional level.  END OF SESSION:   PT End of Session - 03/09/22 1129     Visit Number 4    Number of Visits 4    Date for PT Re-Evaluation 04/13/22    Authorization Type [employer]    PT Start Time 1130    PT Stop Time 1210    PT Time Calculation (min) 40 min    Activity Tolerance Patient tolerated treatment well    Behavior During Therapy WFL for tasks assessed/performed             Past Medical History:  Diagnosis Date   Allergic rhinitis due to pollen    Allergy    seasonal   Anxiety    Blood transfusion without reported diagnosis    do not except blood transfusions.   Cataract    beginning stage   DDD (degenerative disc disease), cervical    Severe w/ osteophytes on xray 3/16   ED (erectile dysfunction)    GERD (gastroesophageal reflux disease)    Heart murmur    History of Holter monitoring    started on march 19,2020   Hypertension    OBESITY    Osteoarthritis    Prostate cancer ([place])    Pulmonary stenosis, valvar    TEE 07/2011: stable, LVEF 55%, small PFO   Refusal of blood transfusions as patient is Jehovah's Witness    Rheumatic fever    residual pulm stenosis, follows with ganji every 77mo  RHINITIS, CHRONIC    SLEEP APNEA, OBSTRUCTIVE dx 2000   NPSG 2000:  AHI 76/hr CPAP titrated to 11cm 2002    Past Surgical History:  Procedure Laterality Date   COLONOSCOPY      PROSTATE BIOPSY     ROTATOR CUFF REPAIR     2018   TEE WITHOUT CARDIOVERSION  08/02/2011   Procedure: TRANSESOPHAGEAL ECHOCARDIOGRAM (TEE);  Surgeon: JLaverda Page MD;  [facility]  Service: Cardiovascular;  Laterality: N/A;   Patient Active Problem List   Diagnosis Date Noted   Acute pain of left knee 01/03/2022   Pain in joint of right shoulder 01/03/2022   Lumbar radiculopathy, chronic 03/26/2021   [place]19[place]   Mild tricuspid regurgitation 07/18/2019   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) [place] 07/18[place]   Stage III chronic kidney disease [place] 07/18[place]2019   Spinal stenosis of lumbar region at multiple levels 04/08/2019   Syncope and collapse 07/24/2018   Asthma, mild intermittent 08/03/2017   Degenerative cervical disc 08/05/2014   Routine general medical examination at a health care facility 07/22/2014   RBBB with left anterior fascicular block    Pulmonary stenosis, valvar    ED (erectile dysfunction) 03/22/2011   Osteoarthritis 03/01/2011   Seasonal and perennial allergic rhinitis 03/01/2011   Obesity 04/21/2008   Obstructive sleep apnea 04/21/2008   Essential hypertension 04/21/2008   CARDIAC MURMUR 04/21/2008    REFERRING DIAG: M25.511 (ICD-10-CM) - Pain in joint of right  shoulder   THERAPY DIAG: R RC tear   Rationale for Evaluation and Treatment Rehabilitation  PERTINENT HISTORY: 1. Rotator Cuff Injury Patient suffering from rotator cuff injuries to Supraspinatus, Infraspinatus, and possibly Subscapularis. Patient with notable weakness and pain on exam, localizing to supraspinatus and infraspinatus - MRI demonstrated full thickness tears here with atrophy. Patient pain is notable, but patient not wanting joint injection at this time. Patient will attempt PT, and may consider joint injection/shoulder replacement in future if necessary.  -PT  PRECAUTIONS: R RC tear  SUBJECTIVE: R shoulder soreness persists but  improves week to week.  Feels he has regained enough function to transition to independent management and a HEP.  PAIN:  Are you having pain? Yes: NPRS scale: 5/10 Pain location: R shoulder Pain description: ache Aggravating factors: activity  Relieving factors: rest   OBJECTIVE: (objective measures completed at initial evaluation unless otherwise dated)   DIAGNOSTIC FINDINGS:  CLINICAL DATA:  Right shoulder pain over the last 6 weeks   EXAM: MRI OF THE RIGHT SHOULDER WITHOUT CONTRAST   TECHNIQUE: Multiplanar, multisequence MR imaging of the shoulder was performed. No intravenous contrast was administered.   COMPARISON:  Radiographs 01/03/2022   FINDINGS: Despite efforts by the technologist and patient, motion artifact is present on today's exam and could not be eliminated. This reduces exam sensitivity and specificity.   Rotator cuff: Full-thickness full width retracted rupture of the supraspinatus tendon, retracted back about 5 cm. Full-thickness full width rupture of the infraspinatus tendon, retracted about 2 cm. Prominent distal subscapularis tendinopathy likely with partial thickness articular surface tearing.   Muscles:  Atrophic supraspinatus and infraspinatus muscles.   Biceps long head: Intra-articular portion not visualized compatible with rupture, biceps tenodesis, or biceps tenotomy.   Acromioclavicular Joint: Possible acromioplasty or distal clavicular resection, with a 1.3 cm gap at the [facility] joint which is fluid-filled and communicates with the subacromial subdeltoid bursa. Type II acromion. There is extensive fluid in the subacromial subdeltoid bursa freely communicating with the glenohumeral joint.   Glenohumeral Joint: Prominent degenerative glenohumeral arthropathy with chondral thinning, moderate spurring, and subcortical marrow edema and degenerative subcortical cyst formation along the glenoid especially superiorly but also posteriorly and  anteriorly. Glenohumeral joint effusion communicates with the subacromial subdeltoid bursa.   Labrum: Exophytic cystic lesion from the posterosuperior labrum measures 1.2 by 0.7 by 1.1 cm and is probably a paralabral cyst.   Bones:  Tracks from prior rotator cuff anchors noted.   Other: Right axillary lymph nodes are partially included on today's examination and could measure up to 1.4 cm in short axis for example on image 2 series 9.   IMPRESSION: 1. Full-thickness full width ruptures of the supraspinatus and infraspinatus tendons with associated muscular atrophy. Prominent distal subscapularis tendinopathy likely with partial thickness articular surface tearing. 2. Nonvisualization of the long head of the biceps, query rupture or tenotomy. 3. Joint effusion freely communicating with the subacromial subdeltoid bursa. 4. Probable paralabral cyst along the posterosuperior labrum. 5. Possible right axillary adenopathy.     Electronically Signed   By: Van Clines M.D.   On: 01/19/2022 07:49   PATIENT SURVEYS:  FOTO 46(65 predicted); 03/09/22 45   COGNITION:           Overall cognitive status: Within functional limits for tasks assessed  SENSATION: Not tested   POSTURE: Depressed forward shoulders   UPPER EXTREMITY ROM:    A/PROM Right eval Left eval R  03/09/22  Shoulder flexion 90   150  Shoulder extension 30   30  Shoulder abduction 95   150  Shoulder adduction       Shoulder internal rotation       Shoulder external rotation       Elbow flexion WNL   WNL  Elbow extension WNL     Wrist flexion WNL     Wrist extension WNL     Wrist ulnar deviation       Wrist radial deviation       Wrist pronation       Wrist supination       (Blank rows = not tested)   UPPER EXTREMITY MMT:   MMT Right eval Left eval  Shoulder flexion 3  3+  Shoulder extension 4  4  Shoulder abduction 3  3+  Shoulder adduction      Shoulder  internal rotation 4  4  Shoulder external rotation 3  3+  Middle trapezius      Lower trapezius      Elbow flexion      Elbow extension      Wrist flexion      Wrist extension      Wrist ulnar deviation      Wrist radial deviation      Wrist pronation      Wrist supination      Grip strength (lbs)      (Blank rows = not tested)   SHOULDER SPECIAL TESTS:            Positive drop arm/open can/horn blowers tests   JOINT MOBILITY TESTING:  Deferred based on imaging studies   PALPATION:  deferred             TODAY'S TREATMENT:  [facility] Adult PT Treatment:                                                DATE: 03/09/22 Therapeutic Exercise: UBE L1 5 min Supine press 2# 15x Supine flexion 2# 15x Sidelie abduction 2# 15x Sidelie ER 2# 15x Seated shoulder flexion 500g ball 20x pronated position Seated shoulder abduction 500g ball 20x pronated position Standing rows RTB 20x Standing extension RTB 20x Standing ball roll flexion B 20x   [facility] Adult PT Treatment:                                                DATE: 03/02/22 Therapeutic Exercise: UBE L1.5 6 min Supine press 1000g ball 20x Supine flexion 1000g ball 20x Sidelie abduction 20x 500g ball Sidelie ER 20x 500g ball Seated shoulder flexion 500g ball 20x pronated position Seated shoulder abduction 500g ball 20x pronated position Standing rows RTB 20x Standing extension RTB 20x Standing ball roll flexion B 20x Manual Therapy: Supine rhythmic stabilization 60/90/120d flexion 60s duration PNF D1 F/E 15x  [facility] Adult PT Treatment:  DATE: 02/21/22 Therapeutic Exercise: UBE L1 6 min Supine press 1000g ball 15x Supine flexion 1000g ball 15x Sidelie abduction 15x Seated shoulder flexion 500g ball 15x pronated position Seated shoulder abduction 500g ball 15x pronated position Standing rows RTB 15x Standing extension RTB 15x Standing ball roll flexion 15x Manual Therapy: Supine  rhythmic stabilization 60/90/120d flexion 60s duration PNF D1 F/E 15x      PATIENT EDUCATION: Education details: Discussed eval findings, rehab rationale and POC and patient is in agreement  Person educated: Patient Education method: Explanation Education comprehension: verbalized understanding and needs further education     HOME EXERCISE PROGRAM: Access Code: [email or web address] URL: [email or web address] Date: 03/09/2022 Prepared by: Sharlynn Oliphant  Exercises - Scaption Wall Slide with Towel  - 1 x daily - 5 x weekly - 2 sets - 10 reps - Seated Shoulder Abduction with Resistance  - 1 x daily - 5 x weekly - 2 sets - 10 reps - Seated Single Arm Shoulder Flexion  - 1 x daily - 5 x weekly - 2 sets - 10 reps - Scapular Wall Slides  - 1 x daily - 5 x weekly - 2 sets - 10 reps   ASSESSMENT:   CLINICAL IMPRESSION: Showing improved strength in available ROM, now able to reach [facility] but pure abduction remains difficult due to soreness and substitution patterns.     OBJECTIVE IMPAIRMENTS decreased knowledge of condition, decreased ROM, decreased strength, impaired UE functional use, postural dysfunction, and pain.    ACTIVITY LIMITATIONS carrying, lifting, and reach over head   PERSONAL FACTORS Age, Past/current experiences, and 1 comorbidity: prior R RC repair  are also affecting patient's functional outcome.    REHAB POTENTIAL: Fair based on extent of soft tissue damage   CLINICAL DECISION MAKING: Evolving/moderate complexity   EVALUATION COMPLEXITY: Low     GOALS: Goals reviewed with patient? No   SHORT TERM GOALS=LONG TERM GOALS: Target date: 03/16/2022     Patient to demonstrate independence in HEP  Baseline: [id number] Goal status: Met   2.  Increase shoulder flexion, abduction and ER strength to 3+/5 Baseline:  MMT Right eval Left eval  Shoulder flexion 3  3+  Shoulder extension 4  4  Shoulder abduction 3  3+  Shoulder adduction      Shoulder internal rotation  4  4  Shoulder external rotation 3  3+    Goal status: Met   3.  Increase FOTO score to 65 Baseline: 46; 03/09/22 45 Goal status: Not met, plateaued   4.  Increase AROM to 120d flexion and abduction Baseline:  A/PROM Right eval Left eval R  03/09/22  Shoulder flexion 90   150  Shoulder extension 30   30  Shoulder abduction 95   150    Goal status: Met           PLAN: PT FREQUENCY: 1-2x/week   PT DURATION: 4 weeks   PLANNED INTERVENTIONS: Therapeutic exercises, Therapeutic activity, Neuromuscular re-education, Balance training, Gait training, Patient/Family education, Self Care, Joint mobilization, DME instructions, Manual therapy, and Re-evaluation   PLAN FOR NEXT SESSION: DC to HEP    Lanice Shirts, PT 03/09/2022, 12:21 PM

## 2022-03-16 ENCOUNTER — Ambulatory Visit: Payer: Medicare HMO | Admitting: Family Medicine

## 2022-03-16 VITALS — BP 132/66 | Ht 70.0 in | Wt 260.0 lb

## 2022-03-16 DIAGNOSIS — M25511 Pain in right shoulder: Secondary | ICD-10-CM | POA: Diagnosis not present

## 2022-03-17 ENCOUNTER — Encounter: Payer: Self-pay | Admitting: Family Medicine

## 2022-03-17 NOTE — Progress Notes (Signed)
PCP: [name], MD (Inactive)  Subjective:   HPI: Patient is a 76 y.o. male here for right shoulder pain.  Patient reports he is doing better compared to last visit. Has been doing physical therapy (4 visits) and home exercises. Overall pleased with his progress to date. Not waking him at night. Motion and strength have improved.  Past Medical History:  Diagnosis Date   Allergic rhinitis due to pollen    Allergy    seasonal   Anxiety    Blood transfusion without reported diagnosis    do not except blood transfusions.   Cataract    beginning stage   DDD (degenerative disc disease), cervical    Severe w/ osteophytes on xray 3/16   ED (erectile dysfunction)    GERD (gastroesophageal reflux disease)    Heart murmur    History of Holter monitoring    started on march 19,2020   Hypertension    OBESITY    Osteoarthritis    Prostate cancer ([place])    Pulmonary stenosis, valvar    TEE 07/2011: stable, LVEF 55%, small PFO   Refusal of blood transfusions as patient is Jehovah's Witness    Rheumatic fever    residual pulm stenosis, follows with ganji every 51mo  RHINITIS, CHRONIC    SLEEP APNEA, OBSTRUCTIVE dx 2000   NPSG 2000:  AHI 76/hr CPAP titrated to 11cm 2002     Current Outpatient Medications on File Prior to Visit  Medication Sig Dispense Refill   acetaminophen (TYLENOL) 325 MG tablet Take by mouth. Acetaminophen Arthritis.     allopurinol (ZYLOPRIM) 100 MG tablet Take 1 tablet by mouth daily.     amLODipine (NORVASC) 10 MG tablet Take 10 mg by mouth daily.     atorvastatin (LIPITOR) 10 MG tablet TAKE 1 TABLET EVERY DAY 90 tablet 3   azelastine (ASTELIN) 0.1 % nasal spray 1-2 sprays each nostril every 12 hours if needed 90 mL 4   carvedilol (COREG) 25 MG tablet Take 1 tablet (25 mg total) by mouth 2 (two) times daily with a meal. 180 tablet 1   Cholecalciferol (VITAMIN D3) 50 MCG (2000 UT) CHEW Chew 2,000 mcg by mouth.     Coenzyme Q10 (COQ10) 200 MG CAPS  Take by mouth.     COVID-19 mRNA vaccine 2023-2024 (COMIRNATY) syringe Inject into the muscle. 0.3 mL 0   fluticasone (FLONASE) 50 MCG/ACT nasal spray 2 sprays by Each Nare route daily as needed for Rhinitis.     gabapentin (NEURONTIN) 300 MG capsule Take 300 mg by mouth 3 (three) times daily.     GLUTATHIONE PO Take by mouth.     loratadine (CLARITIN) 10 MG tablet Take 10 mg by mouth daily as needed for allergies.     losartan-hydrochlorothiazide (HYZAAR) 50-12.5 MG per tablet Take 1 tablet by mouth 2 (two) times daily. 180 tablet 1   Misc Natural Products (COLON CLEANSE PO) Take by mouth.     polyethylene glycol (MIRALAX / GLYCOLAX) 17 g packet Take 17 g by mouth as needed.      tamsulosin (FLOMAX) 0.4 MG CAPS capsule      Turmeric (QC TUMERIC COMPLEX PO) Take by mouth. Pt reports taking Tumeric root     No current facility-administered medications on file prior to visit.    Past Surgical History:  Procedure Laterality Date   COLONOSCOPY     PROSTATE BIOPSY     ROTATOR CUFF REPAIR     2018   TEE WITHOUT  CARDIOVERSION  08/02/2011   Procedure: TRANSESOPHAGEAL ECHOCARDIOGRAM (TEE);  Surgeon: Laverda Page, MD;  Location: [facility] ENDOSCOPY;  Service: Cardiovascular;  Laterality: N/A;    Allergies  Allergen Reactions   Other Other (See Comments)    Adhesive tape causes skin to peel and darken.   Penicillins     Has patient had a PCN reaction causing immediate rash, facial/tongue/throat swelling, SOB or lightheadedness with hypotension:NO Has patient had a PCN reaction causing severe rash involving mucus membranes or skin necrosis:NO Has patient had a PCN reaction that required hospitalization NO Has patient had a PCN reaction occurring within the last 10 years: NO If all of the above answers are "NO", then may proceed with Cephalosporin use.   Latex Rash    BP 132/66   Ht '5\' 10"'$  (1.778 m)   Wt 260 lb (117.9 kg)   BMI 37.31 kg/m       No data to display              No  data to display              Objective:  Physical Exam:  Gen: NAD, comfortable in exam room  Right shoulder: No swelling, ecchymoses.  No gross deformity. No TTP biceps tendon, AC joint. Abduction and flexion to 140 degrees.  ER to 20 degrees. Negative Hawkins, Neers. Negative Yergasons. Strength 4/5 with empty can and 3/5 resisted external rotation.  5/5 internal rotation. NV intact distally.    Assessment & Plan:  1. 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  He is doing surprisingly well with his motion and strength for his pathology.  He would like to continue with conservative treatment.  Discussed options to consider in future: ortho referral to discuss reverse total shoulder, injection if pain is severe at some point.  Otherwise f/u prn.

## 2022-06-14 DIAGNOSIS — C9110 Chronic lymphocytic leukemia of B-cell type not having achieved remission: Secondary | ICD-10-CM | POA: Diagnosis present

## 2022-08-13 ENCOUNTER — Ambulatory Visit (INDEPENDENT_AMBULATORY_CARE_PROVIDER_SITE_OTHER): Payer: Medicare HMO

## 2022-08-13 ENCOUNTER — Encounter (HOSPITAL_COMMUNITY): Payer: Self-pay | Admitting: Emergency Medicine

## 2022-08-13 ENCOUNTER — Ambulatory Visit (HOSPITAL_COMMUNITY)
Admission: EM | Admit: 2022-08-13 | Discharge: 2022-08-13 | Disposition: A | Discharge status: Home / Self Care | Payer: Medicare HMO

## 2022-08-13 DIAGNOSIS — J209 Acute bronchitis, unspecified: Secondary | ICD-10-CM | POA: Diagnosis not present

## 2022-08-13 DIAGNOSIS — R059 Cough, unspecified: Secondary | ICD-10-CM

## 2022-08-13 DIAGNOSIS — R0602 Shortness of breath: Secondary | ICD-10-CM | POA: Diagnosis not present

## 2022-08-13 DIAGNOSIS — R062 Wheezing: Secondary | ICD-10-CM

## 2022-08-13 MED ORDER — METHYLPREDNISOLONE SODIUM SUCC 125 MG IJ SOLR
80.0000 mg | Freq: Once | INTRAMUSCULAR | Status: AC
  Administered 2022-08-13: 80 mg via INTRAMUSCULAR

## 2022-08-13 MED ORDER — IPRATROPIUM-ALBUTEROL 0.5-2.5 (3) MG/3ML IN SOLN
3.0000 mL | Freq: Once | RESPIRATORY_TRACT | Status: AC
  Administered 2022-08-13: 3 mL via RESPIRATORY_TRACT

## 2022-08-13 MED ORDER — METHYLPREDNISOLONE SODIUM SUCC 125 MG IJ SOLR
INTRAMUSCULAR | Status: AC
  Filled 2022-08-13: qty 2

## 2022-08-13 MED ORDER — IPRATROPIUM-ALBUTEROL 0.5-2.5 (3) MG/3ML IN SOLN
RESPIRATORY_TRACT | Status: AC
  Filled 2022-08-13: qty 3

## 2022-08-13 MED ORDER — PREDNISONE 20 MG PO TABS: 40.0000 mg | ORAL_TABLET | Freq: Every day | ORAL | 0 refills | Status: AC

## 2022-08-13 MED ORDER — BENZONATATE 100 MG PO CAPS: 100.0000 mg | ORAL_CAPSULE | Freq: Three times a day (TID) | ORAL | 0 refills | Status: DC

## 2022-08-13 MED ORDER — ALBUTEROL SULFATE HFA 108 (90 BASE) MCG/ACT IN AERS: 1.0000 | INHALATION_SPRAY | Freq: Four times a day (QID) | RESPIRATORY_TRACT | 0 refills | Status: DC | PRN

## 2022-08-13 NOTE — ED Provider Notes (Signed)
[place]    CSN: [id number] Arrival date & time: 08/13/22  1253      History   Chief Complaint Chief Complaint  Patient presents with   Cough   Sore Throat    HPI Eugene [name] is a 77 y.o. male.   Patient presents to urgent care for evaluation of nasal congestion, sore throat, cough, shortness of breath, and generalized fatigue that started approximately 5-6 days ago. States symptoms started with congestion and throat pain then progressed to cough. No headache, vision changes, fever, dizziness, chest pain, heart palpitations, paresthesias, one-sided weakness, nausea, vomiting, diarrhea, or abdominal pain reported. Denies history of chronic respiratory problems. Non-smoker, denies drug use. No known sick contacts with similar symptoms. Reports hearing "rattle" in his chest especially at nighttime. No orthopnea or leg swelling. Has been using over the counter medicines without relief.   Cough Sore Throat    Past Medical History:  Diagnosis Date   Allergic rhinitis due to pollen    Allergy    seasonal   Anxiety    Blood transfusion without reported diagnosis    do not except blood transfusions.   Cataract    beginning stage   DDD (degenerative disc disease), cervical    Severe w/ osteophytes on xray 3/16   ED (erectile dysfunction)    GERD (gastroesophageal reflux disease)    Heart murmur    History of Holter monitoring    started on march 19,2020   Hypertension    OBESITY    Osteoarthritis    Prostate cancer ([place])    Pulmonary stenosis, valvar    TEE 07/2011: stable, LVEF 55%, small PFO   Refusal of blood transfusions as patient is Jehovah's Witness    Rheumatic fever    residual pulm stenosis, follows with ganji every 62mo   RHINITIS, CHRONIC    SLEEP APNEA, OBSTRUCTIVE dx 2000   NPSG 2000:  AHI 76/hr CPAP titrated to 11cm 2002     Patient Active Problem List   Diagnosis Date Noted   Acute pain of left knee 01/03/2022   Pain in joint of right  shoulder 01/03/2022   Lumbar radiculopathy, chronic 03/26/2021   Malignant neoplasm of prostate ([place]) 07/19/2019   Mild tricuspid regurgitation 07/18/2019   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) ([place]) 07/18/2019   Stage III chronic kidney disease ([place]) 07/18/2019   Spinal stenosis of lumbar region at multiple levels 04/08/2019   Syncope and collapse 07/24/2018   Asthma, mild intermittent 08/03/2017   Degenerative cervical disc 08/05/2014   Routine general medical examination at a health care facility 07/22/2014   RBBB with left anterior fascicular block    Pulmonary stenosis, valvar    ED (erectile dysfunction) 03/22/2011   Osteoarthritis 03/01/2011   Seasonal and perennial allergic rhinitis 03/01/2011   Obesity 04/21/2008   Obstructive sleep apnea 04/21/2008   Essential hypertension 04/21/2008   CARDIAC MURMUR 04/21/2008    Past Surgical History:  Procedure Laterality Date   COLONOSCOPY     PROSTATE BIOPSY     ROTATOR CUFF REPAIR     2018   TEE WITHOUT CARDIOVERSION  08/02/2011   Procedure: TRANSESOPHAGEAL ECHOCARDIOGRAM (TEE);  Surgeon: Laverda Page, MD;  Location: [facility];  Service: Cardiovascular;  Laterality: N/A;       Home Medications    Prior to Admission medications   Medication Sig Start Date End Date Taking? Authorizing Provider  albuterol (VENTOLIN HFA) 108 (90 Base) MCG/ACT inhaler Inhale 1-2 puffs into the lungs every 6 (  six) hours as needed for wheezing or shortness of breath. 08/13/22  Yes Eugene Grumbling, FNP  benzonatate (TESSALON) 100 MG capsule Take 1 capsule (100 mg total) by mouth every 8 (eight) hours. 08/13/22  Yes Eugene Grumbling, FNP  Glucosamine-Chondroitin 250-200 MG TABS Take 1 tablet by mouth in the morning and at bedtime. 04/03/19  Yes [provider]  predniSONE (DELTASONE) 20 MG tablet Take 2 tablets (40 mg total) by mouth daily for 5 days. 08/13/22 08/18/22 Yes StanhopeStasia Cavalier, FNP  acetaminophen (TYLENOL) 325  MG tablet Take by mouth. Acetaminophen Arthritis.    [provider]  allopurinol (ZYLOPRIM) 100 MG tablet Take 1 tablet by mouth daily. 05/17/17   [provider]  amLODipine (NORVASC) 10 MG tablet Take 10 mg by mouth daily. 08/10/18   [provider]  atorvastatin (LIPITOR) 10 MG tablet TAKE 1 TABLET EVERY DAY 01/21/21   Adrian Prows, MD  azelastine (ASTELIN) 0.1 % nasal spray 1-2 sprays each nostril every 12 hours if needed 11/11/19   Baird Lyons D, MD  carvedilol (COREG) 25 MG tablet Take 1 tablet (25 mg total) by mouth 2 (two) times daily with a meal. 01/20/15   Rowe Clack, MD  Cholecalciferol (VITAMIN D3) 50 MCG (2000 UT) CHEW Chew 2,000 mcg by mouth.    [provider]  Coenzyme Q10 (COQ10) 200 MG CAPS Take by mouth.    [provider]  COVID-19 mRNA vaccine [id number] (COMIRNATY) syringe Inject into the muscle. 03/07/22   Carlyle Basques, MD  fluticasone (FLONASE) 50 MCG/ACT nasal spray 2 sprays by Each Nare route daily as needed for Rhinitis. 08/03/17   [provider]  gabapentin (NEURONTIN) 300 MG capsule Take 300 mg by mouth 3 (three) times daily. 03/22/21   [provider]  GLUTATHIONE PO Take by mouth.    [provider]  loratadine (CLARITIN) 10 MG tablet Take 10 mg by mouth daily as needed for allergies.    [provider]  losartan-hydrochlorothiazide (HYZAAR) 50-12.5 MG per tablet Take 1 tablet by mouth 2 (two) times daily. 01/20/15   Rowe Clack, MD  Misc Natural Products (COLON CLEANSE PO) Take by mouth.    [provider]  polyethylene glycol (MIRALAX / GLYCOLAX) 17 g packet Take 17 g by mouth as needed.     [provider]  tamsulosin (FLOMAX) 0.4 MG CAPS capsule  05/23/19   [provider]  Turmeric (QC TUMERIC COMPLEX PO) Take by mouth. Pt reports taking Tumeric root    [provider]    Family History Family History  Problem Relation Age of Onset    Heart disease Mother    Hypertension Other    Melanoma Brother    Heart disease Brother    Colon cancer Neg Hx    Stomach cancer Neg Hx    Rectal cancer Neg Hx    Esophageal cancer Neg Hx    Prostate cancer Neg Hx    Pancreatic cancer Neg Hx     Social History Social History   Tobacco Use   Smoking status: Never   Smokeless tobacco: Never  Vaping Use   Vaping Use: Never used  Substance Use Topics   Alcohol use: Yes    Alcohol/week: 0.0 standard drinks of alcohol    Comment: on weekends liquor   Drug use: No     Allergies   Other, Penicillins, and Latex   Review of Systems Review of Systems Per HPI  Physical  Exam Triage Vital Signs ED Triage Vitals  Enc Vitals Group     BP 08/13/22 1455 121/73     Pulse Rate 08/13/22 1455 68     Resp 08/13/22 1455 18     Temp 08/13/22 1455 98.7 F (37.1 C)     Temp Source 08/13/22 1455 Oral     SpO2 08/13/22 1455 94 %     Weight --      Height --      Head Circumference --      Peak Flow --      Pain Score 08/13/22 1453 0     Pain Loc --      Pain Edu? --      Excl. in [place]? --    No data found.  Updated Vital Signs BP 121/73 (BP Location: Left Arm)   Pulse 68   Temp 98.7 F (37.1 C) (Oral)   Resp 18   SpO2 94%   Visual Acuity Right Eye Distance:   Left Eye Distance:   Bilateral Distance:    Right Eye Near:   Left Eye Near:    Bilateral Near:     Physical Exam Vitals and nursing note reviewed.  Constitutional:      Appearance: He is not ill-appearing or toxic-appearing.  HENT:     Head: Normocephalic and atraumatic.     Right Ear: Hearing, tympanic membrane, ear canal and external ear normal.     Left Ear: Hearing, tympanic membrane, ear canal and external ear normal.     Nose: Nose normal.     Mouth/Throat:     Lips: Pink.     Mouth: Mucous membranes are moist. No injury.     Tongue: No lesions. Tongue does not deviate from midline.     Palate: No mass and lesions.     Pharynx: Oropharynx is  clear. Uvula midline. No pharyngeal swelling, oropharyngeal exudate, posterior oropharyngeal erythema or uvula swelling.     Tonsils: No tonsillar exudate or tonsillar abscesses.  Eyes:     General: Lids are normal. Vision grossly intact. Gaze aligned appropriately.     Extraocular Movements: Extraocular movements intact.     Conjunctiva/sclera: Conjunctivae normal.  Cardiovascular:     Rate and Rhythm: Normal rate and regular rhythm.     Heart sounds: S1 normal and S2 normal. Murmur heard.  Pulmonary:     Effort: Pulmonary effort is normal. No respiratory distress.     Breath sounds: Normal air entry. No stridor. Wheezing and rhonchi present. No rales.     Comments: Audible wheezing without stethoscope heard on exam. Diffuse expiratory wheezing and rhonchi heard to bilateral upper and lower lung fields. Speaking in full sentences without difficulty.  Chest:     Chest wall: No tenderness.  Musculoskeletal:     Cervical back: Neck supple.  Skin:    General: Skin is warm and dry.     Capillary Refill: Capillary refill takes less than 2 seconds.     Findings: No rash.  Neurological:     General: No focal deficit present.     Mental Status: He is alert and oriented to person, place, and time. Mental status is at baseline.     Cranial Nerves: No dysarthria or facial asymmetry.  Psychiatric:        Mood and Affect: Mood normal.        Speech: Speech normal.        Behavior: Behavior normal.  Thought Content: Thought content normal.        Judgment: Judgment normal.      UC Treatments / Results  Labs (all labs ordered are listed, but only abnormal results are displayed) Labs Reviewed - No data to display  EKG   Radiology DG Chest 2 View  Result Date: 08/13/2022 CLINICAL DATA:  Cough, short of breath, wheezing EXAM: CHEST - 2 VIEW COMPARISON:  12/14/2017 FINDINGS: Frontal and lateral views of the chest are obtained on 3 images. Cardiac silhouette is stable. Chronic  enlargement of the central pulmonary vasculature consistent with pulmonary arterial hypertension. No acute airspace disease, effusion, or pneumothorax. No acute bony abnormalities. IMPRESSION: 1. Chronic prominence of the central pulmonary vascular consistent with pulmonary arterial hypertension. 2. No acute intrathoracic process. Electronically Signed   By: Randa Ngo [name].D.   On: 08/13/2022 16:33    Procedures Procedures (including critical care time)  Medications Ordered in UC Medications  ipratropium-albuterol (DUONEB) 0.5-2.5 (3) MG/3ML nebulizer solution 3 mL (3 mLs Nebulization Given 08/13/22 1556)  methylPREDNISolone sodium succinate (SOLU-MEDROL) 125 mg/2 mL injection 80 mg (80 mg Intramuscular Given 08/13/22 1554)    Initial Impression / Assessment and Plan / UC Course  I have reviewed the triage vital signs and the nursing notes.  Pertinent labs & imaging results that were available during my care of the patient were reviewed by me and considered in my medical decision making (see chart for details).   1. Acute bronchitis, shortness of breath Duo-neb and solumedrol 80mg  IM given for shortness of breath and inflammation to the lungs. Significant clinical and subjective improvement in shortness of breath/lung sounds after duo-neb breathing treatment. May use albuterol inhaler 1-2 puffs every 4-6 hours as needed at home for continued relief of cough, shortness of breath, and wheeze. Tessalon perles for cough. Prednisone burst starting tomorrow 40mg  QD for 5 days. No NSAID when using prednisone, advised to take with food to avoid stomach upset. Chest x-ray is without acute cardiopulmonary abnormality. Overall well appearing with hemodynamically stable vital signs. Stable for outpatient management of acute bronchitis with strict ER and urgent care return precautions.   Discussed physical exam and available lab work findings in clinic with patient.  Counseled patient regarding appropriate use  of medications and potential side effects for all medications recommended or prescribed today. Discussed red flag signs and symptoms of worsening condition,when to call the PCP office, return to urgent care, and when to seek higher level of care in the emergency department. Patient verbalizes understanding and agreement with plan. All questions answered. Patient discharged in stable condition.   Final Clinical Impressions(s) / UC Diagnoses   Final diagnoses:  Acute bronchitis, unspecified organism  Shortness of breath     Discharge Instructions      You have bronchitis which is inflammation of the upper airways in your lungs due to a virus. The following medicines will help with your symptoms.   - Take steroid pills sent to pharmacy as directed. Do not take any other NSAID containing medications such as ibuprofen or naproxen/Aleve while taking prednisone. - You may use albuterol inhaler 1 to 2 puffs every 4-6 hours as needed for cough, shortness of breath, and wheezing. - Take cough medicines as prescribed.  - Continue using over the counter medicines as needed as directed. Plain mucinex (guaifenesin) over the counter may further help breakup mucus and help with symptoms.   If you develop any new or worsening symptoms or do not improve in the  next 2 to 3 days, please return.  If your symptoms are severe, please go to the emergency room.  Follow-up with your primary care provider for further evaluation and management of your symptoms as well as ongoing wellness visits.  I hope you feel better!   ED Prescriptions     Medication Sig Dispense Auth. Provider   predniSONE (DELTASONE) 20 MG tablet Take 2 tablets (40 mg total) by mouth daily for 5 days. 10 tablet Eugene Prince M, FNP   benzonatate (TESSALON) 100 MG capsule Take 1 capsule (100 mg total) by mouth every 8 (eight) hours. 21 capsule Eugene Prince M, FNP   albuterol (VENTOLIN HFA) 108 (90 Base) MCG/ACT inhaler Inhale 1-2  puffs into the lungs every 6 (six) hours as needed for wheezing or shortness of breath. 18 g Eugene Grumbling, FNP      PDMP not reviewed this encounter.   Eugene [name], [place] 08/15/22 1739

## 2022-08-13 NOTE — ED Triage Notes (Signed)
Pt c/o sore throat and congestion that started beginning of the week and now going in to chest. Reports OTC allergy medications and nasal spray

## 2022-08-13 NOTE — Discharge Instructions (Addendum)
You have bronchitis which is inflammation of the upper airways in your lungs due to a virus. The following medicines will help with your symptoms.   - Take steroid pills sent to pharmacy as directed. Do not take any other NSAID containing medications such as ibuprofen or naproxen/Aleve while taking prednisone. - You may use albuterol inhaler 1 to 2 puffs every 4-6 hours as needed for cough, shortness of breath, and wheezing. - Take cough medicines as prescribed.  - Continue using over the counter medicines as needed as directed. Plain mucinex (guaifenesin) over the counter may further help breakup mucus and help with symptoms.   If you develop any new or worsening symptoms or do not improve in the next 2 to 3 days, please return.  If your symptoms are severe, please go to the emergency room.  Follow-up with your primary care provider for further evaluation and management of your symptoms as well as ongoing wellness visits.  I hope you feel better! 

## 2022-12-30 ENCOUNTER — Other Ambulatory Visit: Payer: Self-pay | Admitting: Neurosurgery

## 2023-01-03 NOTE — Progress Notes (Signed)
Dr. Sueanne Margarita surgical scheduler called, but no answer. Voicemail left to have surgical orders placed prior to pre-op appointment tomorrow, August 21st.

## 2023-01-03 NOTE — Pre-Procedure Instructions (Signed)
Surgical Instructions   Your procedure is scheduled on January 06, 2023. Report to [facility] Main Entrance "A" at 8:15 A.M., then check in with the Admitting office. Any questions or running late day of surgery: call [phone]  Questions prior to your surgery date: call [phone], Monday-Friday, 8am-4pm. If you experience any cold or flu symptoms such as cough, fever, chills, shortness of breath, etc. between now and your scheduled surgery, please notify us at the above number.     Remember:  Do not eat after midnight the night before your surgery  You may drink clear liquids until 7:15 AM the morning of your surgery.   Clear liquids allowed are: Water, Non-Citrus Juices (without pulp), Carbonated Beverages, Clear Tea, Black Coffee Only (NO MILK, CREAM OR POWDERED CREAMER of any kind), and Gatorade.    Take these medicines the morning of surgery with A SIP OF WATER: acetaminophen (TYLENOL)  allopurinol (ZYLOPRIM)  amLODipine (NORVASC)  atorvastatin (LIPITOR)  brimonidine (ALPHAGAN) ophthalmic solution  carvedilol (COREG)  gabapentin (NEURONTIN)    May take these medicines IF NEEDED: azelastine (ASTELIN) nasal spray  fluticasone (FLONASE) nasal spray    One week prior to surgery, STOP taking any Aspirin (unless otherwise instructed by your surgeon) Aleve, Naproxen, Ibuprofen, Motrin, Advil, Goody's, BC's, all herbal medications, fish oil, and non-prescription vitamins.                     Do NOT Smoke (Tobacco/Vaping) for 24 hours prior to your procedure.  If you use a CPAP at night, you may bring your mask/headgear for your overnight stay.   You will be asked to remove any contacts, glasses, piercing's, hearing aid's, dentures/partials prior to surgery. Please bring cases for these items if needed.    Patients discharged the day of surgery will not be allowed to drive home, and someone needs to stay with them for 24 hours.  SURGICAL WAITING ROOM VISITATION Patients  may have no more than 2 support people in the waiting area - these visitors may rotate.   Pre-op nurse will coordinate an appropriate time for 1 ADULT support person, who may not rotate, to accompany patient in pre-op.  Children under the age of 36 must have an adult with them who is not the patient and must remain in the main waiting area with an adult.  If the patient needs to stay at the hospital during part of their recovery, the visitor guidelines for inpatient rooms apply.  Please refer to the [facility] website for the visitor guidelines for any additional information.   If you received a COVID test during your pre-op visit  it is requested that you wear a mask when out in public, stay away from anyone that may not be feeling well and notify your surgeon if you develop symptoms. If you have been in contact with anyone that has tested positive in the last 10 days please notify you surgeon.      Pre-operative 5 CHG Bathing Instructions   You can play a key role in reducing the risk of infection after surgery. Your skin needs to be as free of germs as possible. You can reduce the number of germs on your skin by washing with CHG (chlorhexidine gluconate) soap before surgery. CHG is an antiseptic soap that kills germs and continues to kill germs even after washing.   DO NOT use if you have an allergy to chlorhexidine/CHG or antibacterial soaps. If your skin becomes reddened or irritated, stop  using the CHG and notify one of our RNs at [phone].   Please shower with the CHG soap starting 4 days before surgery using the following schedule:     Please keep in mind the following:  DO NOT shave, including legs and underarms, starting the day of your first shower.   You may shave your face at any point before/day of surgery.  Place clean sheets on your bed the day you start using CHG soap. Use a clean washcloth (not used since being washed) for each shower. DO NOT sleep with pets once  you start using the CHG.   CHG Shower Instructions:  If you choose to wash your hair and private area, wash first with your normal shampoo/soap.  After you use shampoo/soap, rinse your hair and body thoroughly to remove shampoo/soap residue.  Turn the water OFF and apply about 3 tablespoons (45 ml) of CHG soap to a CLEAN washcloth.  Apply CHG soap ONLY FROM YOUR NECK DOWN TO YOUR TOES (washing for 3-5 minutes)  DO NOT use CHG soap on face, private areas, open wounds, or sores.  Pay special attention to the area where your surgery is being performed.  If you are having back surgery, having someone wash your back for you may be helpful. Wait 2 minutes after CHG soap is applied, then you may rinse off the CHG soap.  Pat dry with a clean towel  Put on clean clothes/pajamas   If you choose to wear lotion, please use ONLY the CHG-compatible lotions on the back of this paper.   Additional instructions for the day of surgery: DO NOT APPLY any lotions, deodorants, cologne, or perfumes.   Do not bring valuables to the hospital. [facility] is not responsible for any belongings/valuables. Do not wear nail polish, gel polish, artificial nails, or any other type of covering on natural nails (fingers and toes) Do not wear jewelry or makeup Put on clean/comfortable clothes.  Please brush your teeth.  Ask your nurse before applying any prescription medications to the skin.     CHG Compatible Lotions   Aveeno Moisturizing lotion  Cetaphil Moisturizing Cream  Cetaphil Moisturizing Lotion  Clairol Herbal Essence Moisturizing Lotion, Dry Skin  Clairol Herbal Essence Moisturizing Lotion, Extra Dry Skin  Clairol Herbal Essence Moisturizing Lotion, Normal Skin  Curel Age Defying Therapeutic Moisturizing Lotion with Alpha Hydroxy  Curel Extreme Care Body Lotion  Curel Soothing Hands Moisturizing Hand Lotion  Curel Therapeutic Moisturizing Cream, Fragrance-Free  Curel Therapeutic Moisturizing Lotion,  Fragrance-Free  Curel Therapeutic Moisturizing Lotion, Original Formula  Eucerin Daily Replenishing Lotion  Eucerin Dry Skin Therapy Plus Alpha Hydroxy Crme  Eucerin Dry Skin Therapy Plus Alpha Hydroxy Lotion  Eucerin Original Crme  Eucerin Original Lotion  Eucerin Plus Crme Eucerin Plus Lotion  Eucerin TriLipid Replenishing Lotion  Keri Anti-Bacterial Hand Lotion  Keri Deep Conditioning Original Lotion Dry Skin Formula Softly Scented  Keri Deep Conditioning Original Lotion, Fragrance Free Sensitive Skin Formula  Keri Lotion Fast Absorbing Fragrance Free Sensitive Skin Formula  Keri Lotion Fast Absorbing Softly Scented Dry Skin Formula  Keri Original Lotion  Keri Skin Renewal Lotion Keri Silky Smooth Lotion  Keri Silky Smooth Sensitive Skin Lotion  Nivea Body Creamy Conditioning Oil  Nivea Body Extra Enriched Teacher, adult education Moisturizing Lotion Nivea Crme  Nivea Skin Firming Lotion  NutraDerm 30 Skin Lotion  NutraDerm Skin Lotion  NutraDerm Therapeutic Skin Cream  NutraDerm Therapeutic Skin Lotion  ProShield  Protective Hand Cream  Provon moisturizing lotion  Please read over the following fact sheets that you were given.

## 2023-01-04 ENCOUNTER — Other Ambulatory Visit: Payer: Self-pay

## 2023-01-04 ENCOUNTER — Encounter (HOSPITAL_COMMUNITY)
Admission: RE | Admit: 2023-01-04 | Discharge: 2023-01-04 | Disposition: A | Discharge status: Home / Self Care | Payer: Medicare HMO | Source: Ambulatory Visit | Attending: Neurosurgery | Admitting: Neurosurgery

## 2023-01-04 ENCOUNTER — Encounter (HOSPITAL_COMMUNITY): Payer: Self-pay

## 2023-01-04 VITALS — BP 131/63 | HR 80 | Temp 98.2°F | Resp 18 | Ht 70.0 in | Wt 260.0 lb

## 2023-01-04 DIAGNOSIS — N1831 Chronic kidney disease, stage 3a: Secondary | ICD-10-CM | POA: Insufficient documentation

## 2023-01-04 DIAGNOSIS — Z01818 Encounter for other preprocedural examination: Secondary | ICD-10-CM | POA: Diagnosis present

## 2023-01-04 DIAGNOSIS — I10 Essential (primary) hypertension: Secondary | ICD-10-CM

## 2023-01-04 DIAGNOSIS — I129 Hypertensive chronic kidney disease with stage 1 through stage 4 chronic kidney disease, or unspecified chronic kidney disease: Secondary | ICD-10-CM | POA: Diagnosis not present

## 2023-01-04 HISTORY — DX: Chronic lymphocytic leukemia of B-cell type not having achieved remission: C91.10

## 2023-01-04 HISTORY — DX: Failed or difficult intubation, initial encounter: T88.4XXA

## 2023-01-04 LAB — BASIC METABOLIC PANEL
Anion gap: 9 (ref 5–15)
BUN: 19 mg/dL (ref 8–23)
CO2: 24 mmol/L (ref 22–32)
Calcium: 8.7 mg/dL — ABNORMAL LOW (ref 8.9–10.3)
Chloride: 108 mmol/L (ref 98–111)
Creatinine, Ser: 1.4 mg/dL — ABNORMAL HIGH (ref 0.61–1.24)
GFR, Estimated: 52 mL/min — ABNORMAL LOW (ref >60)
Glucose, Bld: 83 mg/dL (ref 70–99)
Potassium: 4 mmol/L (ref 3.5–5.1)
Sodium: 141 mmol/L (ref 135–145)

## 2023-01-04 LAB — CBC
HCT: 36.7 % — ABNORMAL LOW (ref 39.0–52.0)
Hemoglobin: 11.8 g/dL — ABNORMAL LOW (ref 13.0–17.0)
MCH: 33.6 pg (ref 26.0–34.0)
MCHC: 32.2 g/dL (ref 30.0–36.0)
MCV: 104.6 fL — ABNORMAL HIGH (ref 80.0–100.0)
Platelets: 165 10*3/uL (ref 150–400)
RBC: 3.51 MIL/uL — ABNORMAL LOW (ref 4.22–5.81)
RDW: 12.7 % (ref 11.5–15.5)
WBC: 32.2 10*3/uL — ABNORMAL HIGH (ref 4.0–10.5)
nRBC: 0 % (ref 0.0–0.2)

## 2023-01-04 LAB — SURGICAL PCR SCREEN
MRSA, PCR: NEGATIVE
Staphylococcus aureus: POSITIVE — AB

## 2023-01-04 NOTE — Progress Notes (Addendum)
Paged Dr. Franky Macho to inform him pt's abnormal lab. WBC's are 32.2 on CBC drawn today at pt's pre op appointment. Office closed at this time;unable to contact scheduler. [employer] sent to Dr. Franky Macho.

## 2023-01-04 NOTE — Progress Notes (Signed)
PCP - Dr. Brayton El- [address] Cardiologist - Dr. Yates Decamp- last saw 04/19/21- f/u PRN  PPM/ICD - denies   Chest x-ray - 08/13/22 EKG - 01/04/23 Stress Test - 09/10/18 ECHO - pt states he has had one within past year, at [facility].- records requested Cardiac Cath - denies  Sleep Study - OSA+ CPAP - denies, unable to tolerate  DM- denies  ASA/Blood Thinner Instructions: n/a   ERAS Protcol - yes, no drink   COVID TEST- n/a   Anesthesia review: yes, cardiac hx, records requested  Patient denies shortness of breath, fever, cough and chest pain at PAT appointment   All instructions explained to the patient, with a verbal understanding of the material. Patient agrees to go over the instructions while at home for a better understanding.  The opportunity to ask questions was provided.

## 2023-01-05 ENCOUNTER — Encounter (HOSPITAL_COMMUNITY): Payer: Self-pay

## 2023-01-05 LAB — NO BLOOD PRODUCTS

## 2023-01-05 NOTE — Progress Notes (Signed)
Anesthesia Chart Review:  Follows with heme/onc at [facility] for history of CLL. Last seen by Dr. Nancy Fetter 10/11/22. 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 He macrocytic anemia as well as thrombocytopenia. He also has anemia of chronic kidney disease. It is not clear if the anemia is connected to the CLL or exclusively connected to the chronic kidney disease. The last time that I saw him he had agreed to proceed with bone marrow biopsy in order to answer that question but he changed his mind and has decided to hold off on the bone marrow biopsy. Updated blood work is pending. He will follow-up in 3 months ."  Patient had prior cardiology evaluation by Dr. Jacinto Halim for mild pulmonary valve stenosis with chronic dyspnea, HTN, OSA.  He is also noted to have bilateral carotid bruit with multiple benign carotid duplexes; suspected due to conducted sounds from mild pulmonary stenosis.  Echo 09/2018 showed EF 66%, grade 1 DD, mild to moderate mitral digitation, mild tricuspid regurgitation, mild pulmonic valve stenosis.  Exercise Myoview stress 08/2018 showed no evidence of ischemia, low risk study.  Event monitor 07/2018 was benign with occasional PVCs.  Last seen 04/19/2021 and noted to be doing well at that time, no further evaluation recommended, recommended to follow-up with cardiology on as-needed basis  Patient refuses blood products.  History of difficult intubation per anesthesia notes from 05/08/2019 at [facility].  Per note, "Number of attempts at approach: 1 Ventilation between attempts: supraglottic airway and 2 hand mask Number of other approaches attempted: 1 Airway placement trauma: none. Other Attempts: Unsuccessful attempted endotracheal techniques: direct laryngoscopy and lighted stylet. Additional Comments: Mask ventilation initially easy, progressed to 2-handed after multiple intubation attempts. Easy ventilation through 4.5 Air-Q LMA. First attempt at intubation through LMA, unable to pass into glottis. Ventilated  through LMA. Second attempt at [facility] with Hyacinth Meeker 3 and Mac 4 by [facility], Grade III view and unable to pass tube. Attempt by [place] with Mac 4, Grade III view, unable to pass ETT. Called for GLidescope while ventilating through LMA. Grade III view with glidescope by Font, ETT passed under epiglottis and into trachea. BLBS and ETCO2 confirmed. There was significant laryngeal and pharyngeal swelling noted. Will plan to extubate over [facility] catheter."  Preop labs reviewed, creatinine mildly elevated 1.40, mild chronic anemia hemoglobin 11.8, chronic leukocytosis with WBC 32.2.  EKG 01/05/23: NSR.  Rate 79.  Right bundle branch block.  Lithotripsy to look.  T wave abnormality, consider lateral ischemia.  No significant change when compared with prior tracings from 2020.  Echocardiogram 09/19/2018: Left ventricle cavity is normal in size. Normal left ventricular wall thickness. Abnormal septal wall motion due interventricular conduction abnormality.  Doppler evidence of grade I (impaired) diastolic dysfunction, normal LAP. Calculated EF 66%. Left atrial cavity is moderately dilated. Mild to moderate mitral regurgitation. Mild tricuspid regurgitation. Estimated pulmonary artery systolic pressure 20 mmHg. Mild pulmonic valve stenosis. Peak gradient 19 mmHg. No significant change compared to previous study dated 02/20/2018.    Exercise myoview stress 09/10/2018:  1. The patient performed treadmill exercise using Bruce protocol, completing 5:15 minutes. The patient completed an estimated workload of 6.7 METS, reaching 103% of the maximum predicted heart rate. Exercise capacity was low. Hemodynamic response was normal. Stress symptoms included dyspnea. The resting electrocardiogram demonstrated normal sinus rhythm, RBBB + LAHB and secondary ST-T changes in anteroseptal leads.  The stress electrocardiogram showed sinus tachycardia, RBBB + LAHB, occasional PVC's and secondary ST-T changes anteroseptal leads, unchanged  compared to resting  EKG.  No ischemic changes seen on stress electrocardiogram.  2. The overall quality of the study is good.  Left ventricular cavity is noted to be normal on the rest and stress studies.  Gated SPECT images reveal normal myocardial thickening and wall motion.  The left ventricular ejection fraction was calculated or visually estimated to be 88%. SPECT rest and stress images reveal decreased tracer uptake in apical, inferior myocardium, likely due to diaphragmatic attenuation.  3. Low risk study.    Event Monitor for 30 days Start date 08/05/2018: Indications syncope and collapse No symptoms reported.  Maximum heart rate 124 bpm, minimum heart rate 58 bpm, sinus rhythm.  Occasional PACs. No heart block.  Carotid duplex 06/19/2018: IMPRESSION: Color duplex indicates minimal heterogeneous and calcified plaque, with no hemodynamically significant stenosis by duplex criteria in the extracranial cerebrovascular circulation.   Incidental imaging of a left thyroid nodule. If warranted, dedicated ultrasound follow-up may be considered.    Zannie Cove [facility] Short Stay Center/Anesthesiology Phone [phone] 01/05/2023 4:09 PM

## 2023-01-05 NOTE — Anesthesia Preprocedure Evaluation (Addendum)
Anesthesia Evaluation  Patient identified by MRN, date of birth, ID band Patient awake    Reviewed: Allergy & Precautions, NPO status , Patient's Chart, lab work & pertinent test results, reviewed documented beta blocker date and time   History of Anesthesia Complications (+) DIFFICULT AIRWAY and history of anesthetic complications  Airway Mallampati: III  TM Distance: >3 FB Neck ROM: Full    Dental  (+) Dental Advisory Given, Chipped, Missing,    Pulmonary asthma , sleep apnea and Continuous Positive Airway Pressure Ventilation    Pulmonary exam normal breath sounds clear to auscultation       Cardiovascular hypertension, Pt. on medications and Pt. on home beta blockers + DOE  Normal cardiovascular exam+ dysrhythmias (RBBB with LAFB) + Valvular Problems/Murmurs (mild pulmonary valve stenosis) MR  Rhythm:Regular Rate:Normal  Echo 09/2018 showed EF 66%, grade 1 DD, mild to moderate mitral regurgitation, mild tricuspid regurgitation, mild pulmonic valve stenosis   Neuro/Psych  PSYCHIATRIC DISORDERS Anxiety      Neuromuscular disease (Spondylolisthesis)    GI/Hepatic Neg liver ROS,GERD  ,,  Endo/Other  Obesity   Renal/GU Renal InsufficiencyRenal disease   Prostate cancer     Musculoskeletal  (+) Arthritis ,    Abdominal   Peds  Hematology  (+) Blood dyscrasia (Hgb 11.8), anemia , REFUSES BLOOD PRODUCTS, JEHOVAH'S WITNESSCLL   Anesthesia Other Findings Day of surgery medications reviewed with the patient.  Reproductive/Obstetrics                             Anesthesia Physical Anesthesia Plan  ASA: 3  Anesthesia Plan: General   Post-op Pain Management: Tylenol PO (pre-op)* and Ketamine IV*   Induction: Intravenous  PONV Risk Score and Plan: 3 and Dexamethasone, Ondansetron and Treatment may vary due to age or medical condition  Airway Management Planned: Oral ETT and Video  Laryngoscope Planned  Additional Equipment:   Intra-op Plan:   Post-operative Plan: Extubation in OR  Informed Consent: I have reviewed the patients History and Physical, chart, labs and discussed the procedure including the risks, benefits and alternatives for the proposed anesthesia with the patient or authorized representative who has indicated his/her understanding and acceptance.     Dental advisory given  Plan Discussed with: CRNA  Anesthesia Plan Comments: (2nd PIV  PAT note by Antionette Poles, PA-C:  Follows with heme/onc at [facility] for history of CLL. Last seen by Dr. Nancy Fetter 10/11/22. Per note, "Mr Poppleton has CLL. He macrocytic anemia as well as thrombocytopenia. He also has anemia of chronic kidney disease. It is not clear if the anemia is connected to the CLL or exclusively connected to the chronic kidney disease. The last time that I saw him he had agreed to proceed with bone marrow biopsy in order to answer that question but he changed his mind and has decided to hold off on the bone marrow biopsy. Updated blood work is pending. He will follow-up in 3 months ."  Patient had prior cardiology evaluation by Dr. Jacinto Halim for mild pulmonary valve stenosis with chronic dyspnea, HTN, OSA.  He is also noted to have bilateral carotid bruit with multiple benign carotid duplexes; suspected due to conducted sounds from mild pulmonary stenosis.  Echo 09/2018 showed EF 66%, grade 1 DD, mild to moderate mitral digitation, mild tricuspid regurgitation, mild pulmonic valve stenosis.  Exercise Myoview stress 08/2018 showed no evidence of ischemia, low risk study.  Event monitor 07/2018 was benign with occasional  PVCs.  Last seen 04/19/2021 and noted to be doing well at that time, no further evaluation recommended, recommended to follow-up with cardiology on as-needed basis  Patient refuses blood products.  History of difficult intubation per anesthesia notes from 05/08/2019 at [facility].  Per note, "Number of  attempts at approach: 1 Ventilation between attempts: supraglottic airway and 2 hand mask Number of other approaches attempted: 1 Airway placement trauma: none. Other Attempts: Unsuccessful attempted endotracheal techniques: direct laryngoscopy and lighted stylet. Additional Comments: Mask ventilation initially easy, progressed to 2-handed after multiple intubation attempts. Easy ventilation through 4.5 Air-Q LMA. First attempt at intubation through LMA, unable to pass into glottis. Ventilated through LMA. Second attempt at [facility] with Hyacinth Meeker 3 and Mac 4 by [facility], Grade III view and unable to pass tube. Attempt by [place] with Mac 4, Grade III view, unable to pass ETT. Called for GLidescope while ventilating through LMA. Grade III view with glidescope by Font, ETT passed under epiglottis and into trachea. BLBS and ETCO2 confirmed. There was significant laryngeal and pharyngeal swelling noted. Will plan to extubate over [facility] catheter."  Preop labs reviewed, creatinine mildly elevated 1.40, mild chronic anemia hemoglobin 11.8, chronic leukocytosis with WBC 32.2.  EKG 01/05/23: NSR.  Rate 79.  Right bundle branch block.  Lithotripsy to look.  T wave abnormality, consider lateral ischemia.  No significant change when compared with prior tracings from 2020.  Echocardiogram 09/19/2018: Left ventricle cavity is normal in size. Normal left ventricular wall thickness. Abnormal septal wall motion due interventricular conduction abnormality. Doppler evidence of grade I (impaired) diastolic dysfunction, normal LAP. Calculated EF 66%. Left atrial cavity is moderately dilated. Mild to moderate mitral regurgitation. Mild tricuspid regurgitation. Estimated pulmonary artery systolic pressure 20 mmHg. Mild pulmonic valve stenosis. Peak gradient 19 mmHg. No significant change compared to previous study dated 02/20/2018.   Exercise myoview stress 09/10/2018:  1. The patient performed treadmill exercise using Bruce  protocol, completing 5:15 minutes. The patient completed an estimated workload of 6.7 METS, reaching 103% of the maximum predicted heart rate. Exercise capacity was low. Hemodynamic response was normal. Stress symptoms included dyspnea. The resting electrocardiogram demonstrated normal sinus rhythm, RBBB + LAHB and secondary ST-T changes in anteroseptal leads. The stress electrocardiogram showed sinus tachycardia, RBBB + LAHB, occasional PVC's and secondary ST-T changes anteroseptal leads, unchanged compared to resting EKG. No ischemic changes seen on stress electrocardiogram.  2. The overall quality of the study is good. Left ventricular cavity is noted to be normal on the rest and stress studies. Gated SPECT images reveal normal myocardial thickening and wall motion. The left ventricular ejection fraction was calculated or visually estimated to be 88%. SPECT rest and stress images reveal decreased tracer uptake in apical, inferior myocardium, likely due to diaphragmatic attenuation.  3. Low risk study.   Event Monitor for 30 days Start date 08/05/2018:Indications syncope and collapse No symptoms reported. Maximum heart rate 124 bpm, minimum heart rate 58 bpm, sinus rhythm. Occasional PACs. No heart block.  Carotid duplex 06/19/2018: IMPRESSION: Color duplex indicates minimal heterogeneous and calcified plaque, with no hemodynamically significant stenosis by duplex criteria in the extracranial cerebrovascular circulation.  Incidental imaging of a left thyroid nodule. If warranted, dedicated ultrasound follow-up may be considered.  )        Anesthesia Quick Evaluation

## 2023-01-06 ENCOUNTER — Ambulatory Visit (HOSPITAL_COMMUNITY): Payer: Medicare HMO | Admitting: Registered Nurse

## 2023-01-06 ENCOUNTER — Other Ambulatory Visit: Payer: Self-pay

## 2023-01-06 ENCOUNTER — Ambulatory Visit (HOSPITAL_COMMUNITY)
Admission: RE | Admit: 2023-01-06 | Discharge: 2023-01-07 | Disposition: A | Discharge status: Home / Self Care | Payer: Medicare HMO | Source: Home / Self Care | Attending: Neurosurgery | Admitting: Neurosurgery

## 2023-01-06 ENCOUNTER — Encounter (HOSPITAL_COMMUNITY): Payer: Self-pay | Admitting: Neurosurgery

## 2023-01-06 ENCOUNTER — Ambulatory Visit (HOSPITAL_COMMUNITY): Payer: Medicare HMO | Admitting: Physician Assistant

## 2023-01-06 ENCOUNTER — Encounter (HOSPITAL_COMMUNITY)
Admission: RE | Disposition: A | Discharge status: Home / Self Care | Payer: Self-pay | Source: Home / Self Care | Attending: Neurosurgery

## 2023-01-06 ENCOUNTER — Ambulatory Visit (HOSPITAL_COMMUNITY): Payer: Medicare HMO

## 2023-01-06 DIAGNOSIS — I452 Bifascicular block: Secondary | ICD-10-CM | POA: Insufficient documentation

## 2023-01-06 DIAGNOSIS — M4310 Spondylolisthesis, site unspecified: Principal | ICD-10-CM

## 2023-01-06 DIAGNOSIS — Z6837 Body mass index (BMI) 37.0-37.9, adult: Secondary | ICD-10-CM | POA: Insufficient documentation

## 2023-01-06 DIAGNOSIS — I288 Other diseases of pulmonary vessels: Secondary | ICD-10-CM | POA: Insufficient documentation

## 2023-01-06 DIAGNOSIS — K219 Gastro-esophageal reflux disease without esophagitis: Secondary | ICD-10-CM | POA: Insufficient documentation

## 2023-01-06 DIAGNOSIS — Z8546 Personal history of malignant neoplasm of prostate: Secondary | ICD-10-CM | POA: Insufficient documentation

## 2023-01-06 DIAGNOSIS — I081 Rheumatic disorders of both mitral and tricuspid valves: Secondary | ICD-10-CM | POA: Insufficient documentation

## 2023-01-06 DIAGNOSIS — M48062 Spinal stenosis, lumbar region with neurogenic claudication: Secondary | ICD-10-CM | POA: Insufficient documentation

## 2023-01-06 DIAGNOSIS — M4316 Spondylolisthesis, lumbar region: Secondary | ICD-10-CM | POA: Insufficient documentation

## 2023-01-06 DIAGNOSIS — M1990 Unspecified osteoarthritis, unspecified site: Secondary | ICD-10-CM | POA: Insufficient documentation

## 2023-01-06 DIAGNOSIS — I10 Essential (primary) hypertension: Secondary | ICD-10-CM | POA: Insufficient documentation

## 2023-01-06 DIAGNOSIS — R531 Weakness: Secondary | ICD-10-CM | POA: Diagnosis not present

## 2023-01-06 DIAGNOSIS — C9110 Chronic lymphocytic leukemia of B-cell type not having achieved remission: Secondary | ICD-10-CM | POA: Insufficient documentation

## 2023-01-06 DIAGNOSIS — G4733 Obstructive sleep apnea (adult) (pediatric): Secondary | ICD-10-CM | POA: Insufficient documentation

## 2023-01-06 DIAGNOSIS — E669 Obesity, unspecified: Secondary | ICD-10-CM | POA: Insufficient documentation

## 2023-01-06 HISTORY — PX: BACK SURGERY: SHX140

## 2023-01-06 LAB — CBC
HCT: 34.5 % — ABNORMAL LOW (ref 39.0–52.0)
Hemoglobin: 11.1 g/dL — ABNORMAL LOW (ref 13.0–17.0)
MCH: 32.9 pg (ref 26.0–34.0)
MCHC: 32.2 g/dL (ref 30.0–36.0)
MCV: 102.4 fL — ABNORMAL HIGH (ref 80.0–100.0)
Platelets: 153 10*3/uL (ref 150–400)
RBC: 3.37 MIL/uL — ABNORMAL LOW (ref 4.22–5.81)
RDW: 12.6 % (ref 11.5–15.5)
WBC: 38.3 10*3/uL — ABNORMAL HIGH (ref 4.0–10.5)
nRBC: 0 % (ref 0.0–0.2)

## 2023-01-06 LAB — CREATININE, SERUM
Creatinine, Ser: 1.37 mg/dL — ABNORMAL HIGH (ref 0.61–1.24)
GFR, Estimated: 53 mL/min — ABNORMAL LOW (ref >60)

## 2023-01-06 SURGERY — POSTERIOR LUMBAR FUSION 1 LEVEL
Anesthesia: General | Site: Spine Lumbar

## 2023-01-06 MED ORDER — PHENOL 1.4 % MT LIQD: 1.0000 | OROMUCOSAL | Status: DC | PRN

## 2023-01-06 MED ORDER — FENTANYL CITRATE (PF) 100 MCG/2ML IJ SOLN
INTRAMUSCULAR | Status: AC
  Filled 2023-01-06: qty 2

## 2023-01-06 MED ORDER — VANCOMYCIN HCL 1500 MG/300ML IV SOLN
1500.0000 mg | Freq: Once | INTRAVENOUS | Status: AC
  Administered 2023-01-06: 1500 mg via INTRAVENOUS
  Filled 2023-01-06: qty 300

## 2023-01-06 MED ORDER — 0.9 % SODIUM CHLORIDE (POUR BTL) OPTIME
TOPICAL | Status: DC | PRN
  Administered 2023-01-06: 1000 mL

## 2023-01-06 MED ORDER — ORAL CARE MOUTH RINSE: 15.0000 mL | Freq: Once | OROMUCOSAL | Status: AC

## 2023-01-06 MED ORDER — LIDOCAINE 2% (20 MG/ML) 5 ML SYRINGE
INTRAMUSCULAR | Status: AC
  Filled 2023-01-06: qty 5

## 2023-01-06 MED ORDER — SUCCINYLCHOLINE CHLORIDE 200 MG/10ML IV SOSY
PREFILLED_SYRINGE | INTRAVENOUS | Status: AC
  Filled 2023-01-06: qty 10

## 2023-01-06 MED ORDER — TAMSULOSIN HCL 0.4 MG PO CAPS
0.4000 mg | ORAL_CAPSULE | Freq: Every evening | ORAL | Status: DC
  Administered 2023-01-06: 0.4 mg via ORAL
  Filled 2023-01-06: qty 1

## 2023-01-06 MED ORDER — SODIUM CHLORIDE 0.9 % IV SOLN: 250.0000 mL | INTRAVENOUS | Status: DC

## 2023-01-06 MED ORDER — THROMBIN 20000 UNITS EX SOLR: CUTANEOUS | Status: DC | PRN

## 2023-01-06 MED ORDER — SODIUM CHLORIDE 0.9% FLUSH: 3.0000 mL | INTRAVENOUS | Status: DC | PRN

## 2023-01-06 MED ORDER — OXYCODONE HCL 5 MG PO TABS: 5.0000 mg | ORAL_TABLET | ORAL | Status: DC | PRN

## 2023-01-06 MED ORDER — MENTHOL 3 MG MT LOZG: 1.0000 | LOZENGE | OROMUCOSAL | Status: DC | PRN

## 2023-01-06 MED ORDER — PROPOFOL 10 MG/ML IV BOLUS
INTRAVENOUS | Status: DC | PRN
Start: 2023-01-06 — End: 2023-01-06
  Administered 2023-01-06: 30 mg via INTRAVENOUS
  Administered 2023-01-06: 170 mg via INTRAVENOUS

## 2023-01-06 MED ORDER — LOSARTAN POTASSIUM 50 MG PO TABS
50.0000 mg | ORAL_TABLET | Freq: Two times a day (BID) | ORAL | Status: DC
  Administered 2023-01-06 – 2023-01-07 (×2): 50 mg via ORAL
  Filled 2023-01-06 (×2): qty 1

## 2023-01-06 MED ORDER — LOSARTAN POTASSIUM-HCTZ 50-12.5 MG PO TABS: 1.0000 | ORAL_TABLET | Freq: Two times a day (BID) | ORAL | Status: DC

## 2023-01-06 MED ORDER — BRIMONIDINE TARTRATE 0.2 % OP SOLN
1.0000 [drp] | Freq: Two times a day (BID) | OPHTHALMIC | Status: DC
  Administered 2023-01-06 – 2023-01-07 (×2): 1 [drp] via OPHTHALMIC
  Filled 2023-01-06: qty 5

## 2023-01-06 MED ORDER — BUPIVACAINE HCL (PF) 0.5 % IJ SOLN
INTRAMUSCULAR | Status: AC
  Filled 2023-01-06: qty 30

## 2023-01-06 MED ORDER — FENTANYL CITRATE (PF) 100 MCG/2ML IJ SOLN
25.0000 ug | INTRAMUSCULAR | Status: DC | PRN
  Administered 2023-01-06 (×2): 50 ug via INTRAVENOUS

## 2023-01-06 MED ORDER — POTASSIUM CHLORIDE IN NACL 20-0.9 MEQ/L-% IV SOLN: INTRAVENOUS | Status: DC

## 2023-01-06 MED ORDER — ACETAMINOPHEN 10 MG/ML IV SOLN
INTRAVENOUS | Status: AC
  Filled 2023-01-06: qty 100

## 2023-01-06 MED ORDER — ONDANSETRON HCL 4 MG/2ML IJ SOLN
INTRAMUSCULAR | Status: AC
  Filled 2023-01-06: qty 2

## 2023-01-06 MED ORDER — MORPHINE SULFATE (PF) 2 MG/ML IV SOLN: 2.0000 mg | INTRAVENOUS | Status: DC | PRN

## 2023-01-06 MED ORDER — THROMBIN 20000 UNITS EX SOLR
CUTANEOUS | Status: AC
  Filled 2023-01-06: qty 20000

## 2023-01-06 MED ORDER — ACETAMINOPHEN 650 MG RE SUPP: 650.0000 mg | RECTAL | Status: DC | PRN

## 2023-01-06 MED ORDER — FENTANYL CITRATE (PF) 250 MCG/5ML IJ SOLN
INTRAMUSCULAR | Status: DC | PRN
  Administered 2023-01-06 (×2): 50 ug via INTRAVENOUS
  Administered 2023-01-06: 100 ug via INTRAVENOUS
  Administered 2023-01-06: 50 ug via INTRAVENOUS

## 2023-01-06 MED ORDER — PHENYLEPHRINE 80 MCG/ML (10ML) SYRINGE FOR IV PUSH (FOR BLOOD PRESSURE SUPPORT)
PREFILLED_SYRINGE | INTRAVENOUS | Status: DC | PRN
  Administered 2023-01-06 (×3): 120 ug via INTRAVENOUS

## 2023-01-06 MED ORDER — GABAPENTIN 300 MG PO CAPS
300.0000 mg | ORAL_CAPSULE | Freq: Two times a day (BID) | ORAL | Status: DC
  Administered 2023-01-06 – 2023-01-07 (×2): 300 mg via ORAL
  Filled 2023-01-06 (×2): qty 1

## 2023-01-06 MED ORDER — AMLODIPINE BESYLATE 10 MG PO TABS
10.0000 mg | ORAL_TABLET | Freq: Every day | ORAL | Status: DC
  Administered 2023-01-07: 10 mg via ORAL
  Filled 2023-01-06: qty 1

## 2023-01-06 MED ORDER — ONDANSETRON HCL 4 MG/2ML IJ SOLN: 4.0000 mg | Freq: Once | INTRAMUSCULAR | Status: DC | PRN

## 2023-01-06 MED ORDER — ATORVASTATIN CALCIUM 10 MG PO TABS
10.0000 mg | ORAL_TABLET | Freq: Every day | ORAL | Status: DC
  Administered 2023-01-07: 10 mg via ORAL
  Filled 2023-01-06: qty 1

## 2023-01-06 MED ORDER — OXYCODONE HCL ER 10 MG PO T12A
10.0000 mg | EXTENDED_RELEASE_TABLET | Freq: Two times a day (BID) | ORAL | Status: DC
  Administered 2023-01-06 – 2023-01-07 (×2): 10 mg via ORAL
  Filled 2023-01-06 (×2): qty 1

## 2023-01-06 MED ORDER — LIDOCAINE 2% (20 MG/ML) 5 ML SYRINGE
INTRAMUSCULAR | Status: DC | PRN
  Administered 2023-01-06: 100 mg via INTRAVENOUS

## 2023-01-06 MED ORDER — ONDANSETRON HCL 4 MG/2ML IJ SOLN
INTRAMUSCULAR | Status: DC | PRN
  Administered 2023-01-06: 4 mg via INTRAVENOUS

## 2023-01-06 MED ORDER — LACTATED RINGERS IV SOLN
INTRAVENOUS | Status: DC | PRN
Start: 2023-01-06 — End: 2023-01-06

## 2023-01-06 MED ORDER — OXYCODONE HCL 5 MG PO TABS
10.0000 mg | ORAL_TABLET | ORAL | Status: DC | PRN
  Administered 2023-01-06: 10 mg via ORAL
  Filled 2023-01-06: qty 2

## 2023-01-06 MED ORDER — ACETAMINOPHEN 325 MG PO TABS: 650.0000 mg | ORAL_TABLET | ORAL | Status: DC | PRN

## 2023-01-06 MED ORDER — LIDOCAINE-EPINEPHRINE 0.5 %-1:200000 IJ SOLN
INTRAMUSCULAR | Status: AC
  Filled 2023-01-06: qty 50

## 2023-01-06 MED ORDER — ZOLPIDEM TARTRATE 5 MG PO TABS: 5.0000 mg | ORAL_TABLET | Freq: Every evening | ORAL | Status: DC | PRN

## 2023-01-06 MED ORDER — CHLORHEXIDINE GLUCONATE 0.12 % MT SOLN
15.0000 mL | Freq: Once | OROMUCOSAL | Status: AC
  Administered 2023-01-06: 15 mL via OROMUCOSAL
  Filled 2023-01-06: qty 15

## 2023-01-06 MED ORDER — HEPARIN SODIUM (PORCINE) 5000 UNIT/ML IJ SOLN
5000.0000 [IU] | Freq: Three times a day (TID) | INTRAMUSCULAR | Status: DC
  Administered 2023-01-06: 5000 [IU] via SUBCUTANEOUS
  Filled 2023-01-06: qty 1

## 2023-01-06 MED ORDER — AZELASTINE HCL 0.1 % NA SOLN
1.0000 | Freq: Two times a day (BID) | NASAL | Status: DC
  Administered 2023-01-06 – 2023-01-07 (×2): 2 via NASAL
  Filled 2023-01-06: qty 30

## 2023-01-06 MED ORDER — ONDANSETRON HCL 4 MG PO TABS: 4.0000 mg | ORAL_TABLET | Freq: Four times a day (QID) | ORAL | Status: DC | PRN

## 2023-01-06 MED ORDER — LACTATED RINGERS IV SOLN: INTRAVENOUS | Status: DC

## 2023-01-06 MED ORDER — ALBUMIN HUMAN 5 % IV SOLN: INTRAVENOUS | Status: DC | PRN

## 2023-01-06 MED ORDER — PHENYLEPHRINE HCL-NACL 20-0.9 MG/250ML-% IV SOLN
INTRAVENOUS | Status: DC | PRN
  Administered 2023-01-06: 25 ug/min via INTRAVENOUS

## 2023-01-06 MED ORDER — FENTANYL CITRATE (PF) 250 MCG/5ML IJ SOLN
INTRAMUSCULAR | Status: AC
  Filled 2023-01-06: qty 5

## 2023-01-06 MED ORDER — ACETAMINOPHEN 10 MG/ML IV SOLN
INTRAVENOUS | Status: DC | PRN
  Administered 2023-01-06: 1000 mg via INTRAVENOUS

## 2023-01-06 MED ORDER — KETAMINE HCL 50 MG/5ML IJ SOSY
PREFILLED_SYRINGE | INTRAMUSCULAR | Status: AC
  Filled 2023-01-06: qty 5

## 2023-01-06 MED ORDER — BUPIVACAINE HCL (PF) 0.5 % IJ SOLN
INTRAMUSCULAR | Status: DC | PRN
  Administered 2023-01-06: 20 mL

## 2023-01-06 MED ORDER — FLUTICASONE PROPIONATE 50 MCG/ACT NA SUSP: 1.0000 | Freq: Every day | NASAL | Status: DC | PRN

## 2023-01-06 MED ORDER — SENNA 8.6 MG PO TABS
1.0000 | ORAL_TABLET | Freq: Two times a day (BID) | ORAL | Status: DC
  Administered 2023-01-06 – 2023-01-07 (×2): 8.6 mg via ORAL
  Filled 2023-01-06 (×2): qty 1

## 2023-01-06 MED ORDER — ACETAMINOPHEN 500 MG PO TABS
1000.0000 mg | ORAL_TABLET | Freq: Once | ORAL | Status: DC
  Filled 2023-01-06: qty 2

## 2023-01-06 MED ORDER — SUCCINYLCHOLINE CHLORIDE 200 MG/10ML IV SOSY
PREFILLED_SYRINGE | INTRAVENOUS | Status: DC | PRN
  Administered 2023-01-06: 120 mg via INTRAVENOUS

## 2023-01-06 MED ORDER — LACTATED RINGERS IV SOLN: INTRAVENOUS | Status: DC | PRN

## 2023-01-06 MED ORDER — ALLOPURINOL 100 MG PO TABS
100.0000 mg | ORAL_TABLET | Freq: Every day | ORAL | Status: DC
  Administered 2023-01-07: 100 mg via ORAL
  Filled 2023-01-06: qty 1

## 2023-01-06 MED ORDER — HYDROCHLOROTHIAZIDE 12.5 MG PO TABS
12.5000 mg | ORAL_TABLET | Freq: Two times a day (BID) | ORAL | Status: DC
  Administered 2023-01-06 – 2023-01-07 (×2): 12.5 mg via ORAL
  Filled 2023-01-06 (×2): qty 1

## 2023-01-06 MED ORDER — LIDOCAINE-EPINEPHRINE 0.5 %-1:200000 IJ SOLN
INTRAMUSCULAR | Status: DC | PRN
  Administered 2023-01-06: 10 mL

## 2023-01-06 MED ORDER — SODIUM CHLORIDE 0.9% FLUSH: 3.0000 mL | Freq: Two times a day (BID) | INTRAVENOUS | Status: DC

## 2023-01-06 MED ORDER — CARVEDILOL 12.5 MG PO TABS
25.0000 mg | ORAL_TABLET | Freq: Two times a day (BID) | ORAL | Status: DC
  Administered 2023-01-07: 25 mg via ORAL
  Filled 2023-01-06: qty 2

## 2023-01-06 MED ORDER — PHENYLEPHRINE 80 MCG/ML (10ML) SYRINGE FOR IV PUSH (FOR BLOOD PRESSURE SUPPORT)
PREFILLED_SYRINGE | INTRAVENOUS | Status: AC
  Filled 2023-01-06: qty 10

## 2023-01-06 MED ORDER — ONDANSETRON HCL 4 MG/2ML IJ SOLN: 4.0000 mg | Freq: Four times a day (QID) | INTRAMUSCULAR | Status: DC | PRN

## 2023-01-06 MED ORDER — POLYETHYLENE GLYCOL 3350 17 G PO PACK: 17.0000 g | PACK | Freq: Every day | ORAL | Status: DC | PRN

## 2023-01-06 MED ORDER — ROCURONIUM BROMIDE 10 MG/ML (PF) SYRINGE
PREFILLED_SYRINGE | INTRAVENOUS | Status: AC
  Filled 2023-01-06: qty 10

## 2023-01-06 MED ORDER — EPHEDRINE 5 MG/ML INJ
INTRAVENOUS | Status: AC
  Filled 2023-01-06: qty 5

## 2023-01-06 MED ORDER — KETAMINE HCL 10 MG/ML IJ SOLN
INTRAMUSCULAR | Status: DC | PRN
Start: 2023-01-06 — End: 2023-01-06
  Administered 2023-01-06: 20 mg via INTRAVENOUS
  Administered 2023-01-06 (×2): 10 mg via INTRAVENOUS

## 2023-01-06 MED ORDER — DIAZEPAM 5 MG PO TABS: 5.0000 mg | ORAL_TABLET | Freq: Four times a day (QID) | ORAL | Status: DC | PRN

## 2023-01-06 MED ORDER — DEXAMETHASONE SODIUM PHOSPHATE 10 MG/ML IJ SOLN
INTRAMUSCULAR | Status: DC | PRN
  Administered 2023-01-06: 10 mg via INTRAVENOUS

## 2023-01-06 MED ORDER — ROCURONIUM BROMIDE 10 MG/ML (PF) SYRINGE
PREFILLED_SYRINGE | INTRAVENOUS | Status: DC | PRN
  Administered 2023-01-06: 20 mg via INTRAVENOUS
  Administered 2023-01-06: 60 mg via INTRAVENOUS
  Administered 2023-01-06: 10 mg via INTRAVENOUS

## 2023-01-06 SURGICAL SUPPLY — 64 items
ADH SKN CLS APL DERMABOND .7 (GAUZE/BANDAGES/DRESSINGS) ×1
APL SKNCLS STERI-STRIP NONHPOA (GAUZE/BANDAGES/DRESSINGS)
BAG COUNTER SPONGE SURGICOUNT (BAG) ×1 IMPLANT
BAG SPNG CNTER NS LX DISP (BAG) ×1
BASKET BONE COLLECTION (BASKET) ×1 IMPLANT
BENZOIN TINCTURE PRP APPL 2/3 (GAUZE/BANDAGES/DRESSINGS) IMPLANT
BLADE BONE MILL MEDIUM (MISCELLANEOUS) ×1 IMPLANT
BLADE CLIPPER SURG (BLADE) IMPLANT
BUR MATCHSTICK NEURO 3.0 LAGG (BURR) ×1 IMPLANT
BUR PRECISION FLUTE 5.0 (BURR) ×1 IMPLANT
CANISTER SUCT 3000ML PPV (MISCELLANEOUS) ×1 IMPLANT
CNTNR URN SCR LID CUP LEK RST (MISCELLANEOUS) ×1 IMPLANT
CONT SPEC 4OZ STRL OR WHT (MISCELLANEOUS) ×1
COVER BACK TABLE 60X90IN (DRAPES) ×1 IMPLANT
DERMABOND ADVANCED .7 DNX12 (GAUZE/BANDAGES/DRESSINGS) ×1 IMPLANT
DRAPE C-ARM 42X72 X-RAY (DRAPES) ×2 IMPLANT
DRAPE C-ARMOR (DRAPES) IMPLANT
DRAPE LAPAROTOMY 100X72X124 (DRAPES) ×1 IMPLANT
DRAPE SURG 17X23 STRL (DRAPES) ×1 IMPLANT
DRSG OPSITE POSTOP 4X6 (GAUZE/BANDAGES/DRESSINGS) IMPLANT
DURAPREP 26ML APPLICATOR (WOUND CARE) ×1 IMPLANT
ELECT REM PT RETURN 9FT ADLT (ELECTROSURGICAL) ×1
ELECTRODE REM PT RTRN 9FT ADLT (ELECTROSURGICAL) ×1 IMPLANT
GAUZE 4X4 16PLY ~~LOC~~+RFID DBL (SPONGE) IMPLANT
GAUZE SPONGE 4X4 12PLY STRL (GAUZE/BANDAGES/DRESSINGS) IMPLANT
GLOVE EXAM NITRILE XL STR (GLOVE) IMPLANT
GLOVE SURG LTX SZ6.5 (GLOVE) ×2 IMPLANT
GOWN STRL REUS W/ TWL LRG LVL3 (GOWN DISPOSABLE) ×2 IMPLANT
GOWN STRL REUS W/ TWL XL LVL3 (GOWN DISPOSABLE) IMPLANT
GOWN STRL REUS W/TWL 2XL LVL3 (GOWN DISPOSABLE) IMPLANT
GOWN STRL REUS W/TWL LRG LVL3 (GOWN DISPOSABLE) ×2
GOWN STRL REUS W/TWL XL LVL3 (GOWN DISPOSABLE)
KIT BASIN OR (CUSTOM PROCEDURE TRAY) ×1 IMPLANT
KIT POSITION SURG JACKSON T1 (MISCELLANEOUS) ×1 IMPLANT
KIT TURNOVER KIT B (KITS) ×1 IMPLANT
MAS PLIF FIXATION DRILL 5.5 IMPLANT
MILL BONE PREP (MISCELLANEOUS) IMPLANT
NDL HYPO 25X1 1.5 SAFETY (NEEDLE) ×1 IMPLANT
NDL SPNL 18GX3.5 QUINCKE PK (NEEDLE) IMPLANT
NEEDLE HYPO 25X1 1.5 SAFETY (NEEDLE) ×1 IMPLANT
NEEDLE SPNL 18GX3.5 QUINCKE PK (NEEDLE) IMPLANT
NS IRRIG 1000ML POUR BTL (IV SOLUTION) ×1 IMPLANT
PACK LAMINECTOMY NEURO (CUSTOM PROCEDURE TRAY) ×1 IMPLANT
PAD ARMBOARD 7.5X6 YLW CONV (MISCELLANEOUS) ×2 IMPLANT
ROD 35MM (Rod) IMPLANT
SCREW LOCK (Screw) ×4 IMPLANT
SCREW LOCK FXNS SPNE MAS PL (Screw) IMPLANT
SCREW SHANK RELINE MOD 7.5X40 (Screw) IMPLANT
SCREW TULIP 5.5 (Screw) IMPLANT
SPACER HALF DOME 9X24X9.5 10D (Spacer) IMPLANT
SPACER OLIF DOME 9X26X9.5 10D (Spacer) IMPLANT
SPIKE FLUID TRANSFER (MISCELLANEOUS) ×1 IMPLANT
SPONGE SURGIFOAM ABS GEL 100 (HEMOSTASIS) ×1 IMPLANT
SPONGE T-LAP 4X18 ~~LOC~~+RFID (SPONGE) IMPLANT
STRIP CLOSURE SKIN 1/2X4 (GAUZE/BANDAGES/DRESSINGS) IMPLANT
SUT PROLENE 6 0 BV (SUTURE) IMPLANT
SUT VIC AB 0 CT1 18XCR BRD8 (SUTURE) ×1 IMPLANT
SUT VIC AB 0 CT1 8-18 (SUTURE) ×1
SUT VIC AB 2-0 CT1 18 (SUTURE) ×1 IMPLANT
SUT VIC AB 3-0 SH 8-18 (SUTURE) ×1 IMPLANT
TOWEL GREEN STERILE (TOWEL DISPOSABLE) ×1 IMPLANT
TOWEL GREEN STERILE FF (TOWEL DISPOSABLE) ×1 IMPLANT
TRAY FOLEY MTR SLVR 16FR STAT (SET/KITS/TRAYS/PACK) ×1 IMPLANT
WATER STERILE IRR 1000ML POUR (IV SOLUTION) ×1 IMPLANT

## 2023-01-06 NOTE — H&P (Signed)
There were no vitals taken for this visit. [name] returned today.  He had some questions about a possible lumbar fusion at 4-5.  While he does have stenosis at 3-4, I do not want to overstate it. It is present, but I don't think it is as severe as what is going on at 4-5, and I think 4-5 is the biggest problem, where he does have the listhesis.  So, given that, I just think possibly a 1-level procedure might be in order for what he is describing.  Secondarily, he had surgery at 3-4, and some of that I see causing a problem with scar tissue. But at 4-5 he has a listhesis, he has stenosis, and that is the issue. [name] is a practicing Jehovah's Witness and he will not allow blood to be transfused.  I will not use the Cell Saver as that also is not in accordance with his religious views as that blood is held outside the body for a prolonged period of time, if it is used at all.  So to make it clear, under life-threatening situation, [name] does not want to be transfused.  He will sign that blood work form the day of surgery or the day before, but I have no intention of allowing any blood to be transfused. Allergies  Allergen Reactions   Other Other (See Comments)    Adhesive tape causes skin to peel and darken.   Penicillins     Has patient had a PCN reaction causing immediate rash, facial/tongue/throat swelling, SOB or lightheadedness with hypotension:NO Has patient had a PCN reaction causing severe rash involving mucus membranes or skin necrosis:NO Has patient had a PCN reaction that required hospitalization NO Has patient had a PCN reaction occurring within the last 10 years: NO If all of the above answers are "NO", then may proceed with Cephalosporin use.   Latex Rash   Past Medical History:  Diagnosis Date   Allergic rhinitis due to pollen    Allergy    seasonal   Anxiety    Blood transfusion without reported diagnosis    do not except blood transfusions.   Cataract    beginning  stage   CLL (chronic lymphocytic leukemia) (HCC)    DDD (degenerative disc disease), cervical    Severe w/ osteophytes on xray 3/16   Difficult intubation    ED (erectile dysfunction)    GERD (gastroesophageal reflux disease)    Heart murmur    History of Holter monitoring    started on march 19,2020   Hypertension    OBESITY    Osteoarthritis    Prostate cancer (HCC)    Pulmonary stenosis, valvar    TEE 07/2011: stable, LVEF 55%, small PFO   Refusal of blood transfusions as patient is Jehovah's Witness    Rheumatic fever    residual pulm stenosis, follows with ganji every 30mo   RHINITIS, CHRONIC    SLEEP APNEA, OBSTRUCTIVE dx 2000   NPSG 2000:  AHI 76/hr CPAP titrated to 11cm 2002    Past Surgical History:  Procedure Laterality Date   BACK SURGERY  2021   COLONOSCOPY     PROSTATE BIOPSY     ROTATOR CUFF REPAIR Right    2018   TEE WITHOUT CARDIOVERSION  08/02/2011   Procedure: TRANSESOPHAGEAL ECHOCARDIOGRAM (TEE);  Surgeon: Pamella Pert, MD;  Location: [facility];  Service: Cardiovascular;  Laterality: N/A;   Family History  Problem Relation Age of Onset   Heart disease  Mother    Hypertension Other    Melanoma Brother    Heart disease Brother    Colon cancer Neg Hx    Stomach cancer Neg Hx    Rectal cancer Neg Hx    Esophageal cancer Neg Hx    Prostate cancer Neg Hx    Pancreatic cancer Neg Hx    Social History   Socioeconomic History   Marital status: Married    Spouse name: Not on file   Number of children: 3   Years of education: Not on file   Highest education level: Not on file  Occupational History   Occupation: retired.      Comment: prev worked as Teaching laboratory technician for [employer]  Tobacco Use   Smoking status: Never   Smokeless tobacco: Never  Vaping Use   Vaping status: Never Used  Substance and Sexual Activity   Alcohol use: Yes    Comment: Only on weekends liquor (mixed drink)   Drug use: No   Sexual activity: Yes  Other  Topics Concern   Not on file  Social History Narrative   Not on file   Social Determinants of Health   Financial Resource Strain: Medium Risk (06/14/2022)   Received from [facility], [facility]   Overall Financial Resource Strain (CARDIA)    Difficulty of Paying Living Expenses: Somewhat hard  Food Insecurity: No Food Insecurity (06/14/2022)   Received from [facility], [facility]   Hunger Vital Sign    Worried About Running Out of Food in the Last Year: Never true    Ran Out of Food in the Last Year: Never true  Transportation Needs: No Transportation Needs (06/14/2022)   Received from [employer] [facility], [facility]   PRAPARE - Transportation    Lack of Transportation (Medical): No    Lack of Transportation (Non-Medical): No  Physical Activity: Not on file  Stress: Not on file  Social Connections: Unknown (01/14/2022)   Received from [facility], [facility]   Social Network    Social Network: Not on file  Intimate Partner Violence: Unknown (01/14/2022)   Received from [facility], [facility]   HITS    Physically Hurt: Not on file    Insult or Talk Down To: Not on file    Threaten Physical Harm: Not on file    Scream or Curse: Not on file    PHYSICAL EXAMINATION: He weighs 257 pounds.  Temperature is 98.8, blood pressure 118/70, pulse 77 pain is 5/10. He is alert, oriented by 4. He answers all questions appropriately.  Memory, language, attention span, and fund of knowledge are normal.  Speech is clear, it is also fluent.  Normal strength 5/5 in the lower and upper extremities 2+ reflexes biceps, triceps, brachioradialis, knees and ankles Intact proprioception upper and lower extremities Mildly antalgic gait Normal muscle tone and bulk Negative romberg OR for lumbar decompression L4/5, posterior lumbar interbody arthrodesis, and pedicle screw fixation. Risks including but not limited to bleeding, infection, nerve damage, inability to walk, bowel and or bladder  dysfunction, no pain relief, hardware failure, aortic injury, and other risks were discussed. He understands and wishes to proceed.

## 2023-01-06 NOTE — Anesthesia Procedure Notes (Signed)
Procedure Name: Intubation Date/Time: 01/06/2023 12:22 PM  Performed by: Collene Schlichter, MDPre-anesthesia Checklist: Patient identified, Emergency Drugs available, Suction available and Patient being monitored Patient Re-evaluated:Patient Re-evaluated prior to induction Oxygen Delivery Method: Circle system utilized Preoxygenation: Pre-oxygenation with 100% oxygen Induction Type: IV induction Ventilation: Mask ventilation without difficulty Laryngoscope Size: Glidescope and 4 Grade View: Grade II Tube type: Oral Tube size: 7.5 mm Number of attempts: 1 Airway Equipment and Method: Stylet and Oral airway Placement Confirmation: ETT inserted through vocal cords under direct vision, positive ETCO2 and breath sounds checked- equal and bilateral Secured at: 23 cm Tube secured with: Tape Dental Injury: Teeth and Oropharynx as per pre-operative assessment  Difficulty Due To: Difficulty was anticipated and Difficult Airway- due to large tongue Future Recommendations: Recommend- induction with short-acting agent, and alternative techniques readily available

## 2023-01-06 NOTE — Op Note (Signed)
01/06/2023  4:49 PM  PATIENT:  Eugene [name]  77 y.o. male With spondylolisthesis and spinal stenosis with neurogenic claudication at L4/5 PRE-OPERATIVE DIAGNOSIS:  Spondylolisthesis L4/5 Lumbar stenosis L4/5 POST-OPERATIVE DIAGNOSIS:  Spondylolisthesis L4/5 Lumbar stenosis with neurogenic claudication L4/5  PROCEDURE:  Procedure(s): LUMBAR FOUR-FIVE POSTERIOR LUMBAR INTERBODY FUSION With Half Dome X cages filled with autograft morsels, autograft morsels also placed into the disc space Laminectomy L4 beyond the needed exposure for a PLIF Non segmental pedicle screw fixation L4-5 7.5 mm x 40mm screws SURGEON:  Surgeon(s): Coletta Memos, MD  ASSISTANTS:none  ANESTHESIA:   general  EBL:  Total I/O In: 2500 [I.V.:2000; IV Piggyback:500] Out: 1025 [Urine:425; Blood:600]  BLOOD ADMINISTERED:none  CELL SAVER GIVEN:not used  COUNT:per nursing  DRAINS: none   SPECIMEN:  No Specimen  DICTATION: [name] is a 77 y.o. male whom was taken to the operating room intubated, and placed under a general anesthetic without difficulty. A foley catheter was placed under sterile conditions. He was positioned prone on a Jackson table with all pressure points properly padded.  His lumbar region was prepped and draped in a sterile manner. I infiltrated 20cc's 1/2%lidocaine/1:2000,000 strength epinephrine into the planned incision. I opened the skin with a 10 blade and took the incision down to the thoracolumbar fascia. I exposed the lamina of L4 and L5 in a subperiosteal fashion bilaterally. I confirmed my location with an intraoperative xray.  I placed self retaining retractors and started the decompression.  I decompressed the spinal canal via a laminectomy of L4.  I also performed bilateral inferior L4 facetectomies and partial superior L5 facectomies. This allowed for a complete decompression in the lateral recesses, and neural foramina for the the L4 nerve roots, and L5 roots. The bony and  ligamentous removal was beyond the needed exposure for a PLIF. I used Kerrison punches and the drill to remove the bone and punches for bone and ligament.  PLIF's were performed at L4/5 in the same fashion. I opened the disc space with a 15 blade then used a variety of instruments to remove the disc and prepare the space for the arthrodesis. I used curettes, rongeurs, punches, shavers for the disc space, and rasps in the discetomy. I measured the disc space and placed Half Dome X cages filled with autograft morsels into the disc space(s). Autograft was also placed into the disc space anterior to and lateral to the cages.  I placed pedicle screws at L4 and L5, using fluoroscopic guidance. I drilled a pilot hole, then cannulated the pedicle with a drill at each site. I then tapped each pedicle, assessing each site for pedicle violations. No cutouts were appreciated. Screws (nuvasive) were then placed at each site without difficulty. I attached rods and locking caps with the appropriate tools. The locking caps were secured with torque limited screwdrivers. Final films were performed and the final construct appeared to be in good position.  I closed the wound in a layered fashion. I approximated the thoracolumbar fascia, subcutaneous, and subcuticular planes with vicryl sutures. I used dermabond and an occlusive bandage for a sterile dressing.     PLAN OF CARE: Admit to inpatient   PATIENT DISPOSITION:  PACU - hemodynamically stable.   Delay start of Pharmacological VTE agent (>24hrs) due to surgical blood loss or risk of bleeding:  yes

## 2023-01-06 NOTE — Transfer of Care (Deleted)
Immediate Anesthesia Transfer of Care Note  Patient: Eugene [name]  Procedure(s) Performed: PLIF - L4-L5 - Posterior Lateral and Interbody fusion  Patient Location: PACU  Anesthesia Type:General  Level of Consciousness: awake and alert   Airway & Oxygen Therapy: Patient connected to face mask oxygen  Post-op Assessment: Report given to RN and Post -op Vital signs reviewed and stable  Post vital signs: Reviewed and stable  Last Vitals:  Vitals Value Taken Time  BP    Temp    Pulse    Resp    SpO2      Last Pain:  Vitals:   01/06/23 0901  TempSrc:   PainSc: 6          Complications: No notable events documented.

## 2023-01-06 NOTE — Anesthesia Postprocedure Evaluation (Signed)
Anesthesia Post Note  Patient: Eugene [name]  Procedure(s) Performed: LUMBAR FOUR-FIVE POSTERIOR LUMBAR INTERBODY FUSION (Spine Lumbar)     Patient location during evaluation: PACU Anesthesia Type: General Level of consciousness: awake and alert Pain management: pain level controlled Vital Signs Assessment: post-procedure vital signs reviewed and stable Respiratory status: spontaneous breathing, nonlabored ventilation, respiratory function stable and patient connected to nasal cannula oxygen Cardiovascular status: blood pressure returned to baseline and stable Postop Assessment: no apparent nausea or vomiting Anesthetic complications: yes   Encounter Notable Events  Notable Event Outcome Phase Comment  Difficult to intubate - expected  Intraprocedure Filed from anesthesia note documentation.    Last Vitals:  Vitals:   01/06/23 1634 01/06/23 1649  BP: 112/63 115/68  Pulse: 73 75  Resp: 19 17  Temp: 36.5 C   SpO2: 94% 92%    Last Pain:  Vitals:   01/06/23 1634  TempSrc:   PainSc: Asleep                 Collene Schlichter

## 2023-01-06 NOTE — Transfer of Care (Signed)
Immediate Anesthesia Transfer of Care Note  Patient: Eugene [name]  Procedure(s) Performed: LUMBAR FOUR-FIVE POSTERIOR LUMBAR INTERBODY FUSION (Spine Lumbar)  Patient Location: PACU  Anesthesia Type:General  Level of Consciousness: awake  Airway & Oxygen Therapy: Patient Spontanous Breathing and Patient connected to face mask oxygen  Post-op Assessment: Report given to RN and Post -op Vital signs reviewed and stable  Post vital signs: Reviewed and stable  Last Vitals:  Vitals Value Taken Time  BP 112/63 01/06/23 1634  Temp 36.5 C 01/06/23 1634  Pulse 73 01/06/23 1636  Resp 12 01/06/23 1636  SpO2 93 % 01/06/23 1636  Vitals shown include unfiled device data.  Last Pain:  Vitals:   01/06/23 0901  TempSrc:   PainSc: 6          Complications:  Encounter Notable Events  Notable Event Outcome Phase Comment  Difficult to intubate - expected  Intraprocedure Filed from anesthesia note documentation.

## 2023-01-07 MED ORDER — TIZANIDINE HCL 4 MG PO TABS: 4.0000 mg | ORAL_TABLET | Freq: Four times a day (QID) | ORAL | 0 refills | Status: DC | PRN

## 2023-01-07 MED ORDER — OXYCODONE HCL 5 MG PO TABS: 5.0000 mg | ORAL_TABLET | Freq: Four times a day (QID) | ORAL | 0 refills | Status: AC | PRN

## 2023-01-07 NOTE — Evaluation (Signed)
Occupational Therapy Evaluation Patient Details Name: Eugene [name] MRN: [id number] DOB: May 13, 1946 Today's Date: 01/07/2023   History of Present Illness 77 y.o. male with stenosis at L3/ L4 and L4/L5, L4 and L5 being of larger concern due to spondylolisthesis. He is now s/p L4/L5 PLIF on 01/06/23. PMH of cataract, DDD, CLL, OA, prostate cancer   Clinical Impression   Pt admitted for above, PTA pt was independent in mobility and ADLs. Reinforced POB precautions, to which he recalled 3/3 by end of session. Pt currently completing ambulation with supervision + RW and ADLs with Supervision to Mod I. He does report soreness in his low back especially when lifting R hip to simulate tub transfer but educated pt on the need for continuous mobility and core tightening to following surgery to promote regain of strength in lumbar muscles. Pt has no further acute skilled OT needs at this time, no follow-up OT recommended.       If plan is discharge home, recommend the following: Assistance with cooking/housework;Assist for transportation    Functional Status Assessment  Patient has had a recent decline in their functional status and demonstrates the ability to make significant improvements in function in a reasonable and predictable amount of time.  Equipment Recommendations  Tub/shower seat    Recommendations for Other Services       Precautions / Restrictions Precautions Precautions: Back Precaution Booklet Issued: Yes (comment) Precaution Comments: Recalls 3/3 precautions Required Braces or Orthoses:  (none) Restrictions Weight Bearing Restrictions: No      Mobility Bed Mobility Overal bed mobility: Needs Assistance Bed Mobility: Rolling, Sidelying to Sit Rolling: Modified independent (Device/Increase time) Sidelying to sit: Modified independent (Device/Increase time)       General bed mobility comments: Good carryover of log roll    Transfers Overall transfer level: Needs  assistance Equipment used: Rolling walker (2 wheels) Transfers: Sit to/from Stand Sit to Stand: Supervision                  Balance Overall balance assessment: Modified Independent                                         ADL either performed or assessed with clinical judgement   ADL Overall ADL's : Needs assistance/impaired Eating/Feeding: Independent;Sitting   Grooming: Supervision/safety;Adhering to UE precautions Grooming Details (indicate cue type and reason): simulated, discussed use of cup to aid in rinsing mouth during oral care Upper Body Bathing: Independent;Standing   Lower Body Bathing: Sitting/lateral leans;Modified independent   Upper Body Dressing : Standing;Modified independent   Lower Body Dressing: Supervision/safety;Sit to/from stand Lower Body Dressing Details (indicate cue type and reason): Pt donning underwear and demonstrated ability to achieve figure four position, simulated donning/doffing socks Toilet Transfer: Supervision/safety;Rolling walker (2 wheels);Ambulation   Toileting- Clothing Manipulation and Hygiene: Supervision/safety;Sit to/from stand   Tub/ Shower Transfer: Supervision/safety;Rolling walker (2 wheels);Ambulation Tub/Shower Transfer Details (indicate cue type and reason): simulated Functional mobility during ADLs: Supervision/safety;Rolling walker (2 wheels) General ADL Comments: Recommending tub seat for bathing, discussed with pt local DME stores and online sites that provide affordable DME options     Vision         Perception         Praxis         Pertinent Vitals/Pain Pain Assessment Pain Assessment: 0-10 Pain Score: 5  Pain Location: low back Pain Descriptors / Indicators:  Constant Pain Intervention(s): Monitored during session, Limited activity within patient's tolerance, Repositioned     Extremity/Trunk Assessment Upper Extremity Assessment Upper Extremity Assessment: Overall WFL for  tasks assessed   Lower Extremity Assessment Lower Extremity Assessment: Overall WFL for tasks assessed (Some generlaized post surgical weakness)   Cervical / Trunk Assessment Cervical / Trunk Assessment: Back Surgery   Communication Communication Communication: No apparent difficulties   Cognition Arousal: Alert Behavior During Therapy: WFL for tasks assessed/performed Overall Cognitive Status: Within Functional Limits for tasks assessed                                       General Comments  VSS    Exercises     Shoulder Instructions      Home Living Family/patient expects to be discharged to:: Private residence Living Arrangements: Spouse/significant other;Children Available Help at Discharge: Family;Available 24 hours/day Type of Home: House Home Access: Stairs to enter [employer] of Steps: 1   Home Layout: One level     Bathroom Shower/Tub: Chief Strategy Officer: Standard     Home Equipment: Rollator (4 wheels);Cane - quad;Other (comment) (suction grab bar in shower)          Prior Functioning/Environment Prior Level of Function : Independent/Modified Independent                        OT Problem List: Pain      OT Treatment/Interventions:      OT Goals(Current goals can be found in the care plan section) Acute Rehab OT Goals Patient Stated Goal: To go home OT Goal Formulation: With patient Time For Goal Achievement: 01/20/23 Potential to Achieve Goals: Good  OT Frequency:      Co-evaluation              AM-PAC OT "6 Clicks" Daily Activity     Outcome Measure Help from another person eating meals?: None Help from another person taking care of personal grooming?: None Help from another person toileting, which includes using toliet, bedpan, or urinal?: None Help from another person bathing (including washing, rinsing, drying)?: None Help from another person to put on and taking off regular  upper body clothing?: None Help from another person to put on and taking off regular lower body clothing?: A Little 6 Click Score: 23   End of Session Equipment Utilized During Treatment: Rolling walker (2 wheels) Nurse Communication: Mobility status  Activity Tolerance: Patient tolerated treatment well Patient left: in bed;with call bell/phone within reach  OT Visit Diagnosis: Pain Pain - part of body:  (back)                Time: 6578-4696 OT Time Calculation (min): 20 min Charges:  OT General Charges $OT Visit: 1 Visit OT Evaluation $OT Eval Low Complexity: 1 Low  01/07/2023  AB, OTR/L  Acute Rehabilitation Services  Office: [phone]   Tristan Schroeder 01/07/2023, 9:55 AM

## 2023-01-07 NOTE — Care Management CC44 (Signed)
Condition Code 44 Documentation Completed  Patient Details  Name: Eugene [name] MRN: [id number] Date of Birth: March 27, 1946   Condition Code 44 given:  Yes Patient signature on Condition Code 44 notice:  Yes Documentation of 2 MD's agreement:  Yes Code 44 added to claim:  Yes    Lawerance Sabal, RN 01/07/2023, 8:59 AM

## 2023-01-07 NOTE — Care Management Obs Status (Signed)
MEDICARE OBSERVATION STATUS NOTIFICATION   Patient Details  Name: Eugene [name] MRN: [id number] Date of Birth: January 24, 1946   Medicare Observation Status Notification Given:  Yes    Lawerance Sabal, RN 01/07/2023, 8:59 AM

## 2023-01-07 NOTE — Plan of Care (Signed)

## 2023-01-07 NOTE — Progress Notes (Signed)
NEUROSURGERY PROGRESS NOTE  Postop day 1 PLIF Doing well. Complains of appropriate back soreness. No leg pain No numbness, tingling or weakness Please ambulate patient today, he has not been out of bed yet. Pending discharge today based on how he ambulates.  Temp:  [97.6 F (36.4 C)-98.6 F (37 C)] 98.5 F (36.9 C) (08/23 2349) Pulse Rate:  [69-83] 83 (08/23 2349) Resp:  [15-20] 18 (08/23 2349) BP: (111-143)/(60-73) 118/63 (08/23 2349) SpO2:  [92 %-99 %] 97 % (08/23 2349) Weight:  [117.9 kg] 117.9 kg (08/23 0835)    Sherryl Manges, NP 01/07/2023 7:22 AM

## 2023-01-07 NOTE — Evaluation (Signed)
Physical Therapy Evaluation Patient Details Name: Eugene [name] MRN: [id number] DOB: Oct 22, 1945 Today's Date: 01/07/2023  History of Present Illness  Pt underwent L4/5 PLIF on 01/06/23.  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  Clinical Impression  Patient is s/p above surgery resulting in the deficits listed below (see PT Problem List).  Patient will mobilizing well and will have support of family at DC.  I have encouraged the patient to gradually increase activity daily to tolerance.     I have answered all patient's question regarding PT and mobility.    Pt feels ready for DC home today.  NO PT recs for equipment or follow-up.  Recommended to pt he uses his rollator at home until pain is better and he feels safe to progress to cane.          If plan is discharge home, recommend the following:     Can travel by private vehicle        Equipment Recommendations None recommended by PT  Recommendations for Other Services       Functional Status Assessment Patient has had a recent decline in their functional status and demonstrates the ability to make significant improvements in function in a reasonable and predictable amount of time.     Precautions / Restrictions Precautions Precautions: Back Precaution Booklet Issued: Yes (comment) Precaution Comments: Pt able to teach back no BLT Required Braces or Orthoses:  (none) Restrictions Weight Bearing Restrictions: No      Mobility  Bed Mobility Overal bed mobility: Needs Assistance Bed Mobility: Rolling, Sidelying to Sit Rolling: Modified independent (Device/Increase time) Sidelying to sit: Modified independent (Device/Increase time)       General bed mobility comments: VC's provided for log rolling    Transfers Overall transfer level: Needs assistance Equipment used: Rolling walker (2 wheels) Transfers: Sit to/from Stand Sit to Stand: Supervision           General  transfer comment: Cues to not pull up on walker    Ambulation/Gait Ambulation/Gait assistance: Supervision Gait Distance (Feet): 120 Feet Assistive device: Rolling walker (2 wheels) Gait Pattern/deviations: Step-through pattern       General Gait Details: Pt with no loss of balance.  Pt did use RW to take some pressure off his back  Stairs Stairs: Yes Stairs assistance:  (Verbally discussed his one step entry tand to always have assist with this for forst couple times he goes in and out of house.)        Wheelchair Mobility     Tilt Bed    Modified Rankin (Stroke Patients Only)       Balance Overall balance assessment: Modified Independent                                           Pertinent Vitals/Pain Pain Assessment Pain Assessment: 0-10 Pain Score: 5  Pain Location: low back Pain Descriptors / Indicators: Constant Pain Intervention(s): Limited activity within patient's tolerance, Monitored during session    Home Living Family/patient expects to be discharged to:: Private residence Living Arrangements: Spouse/significant other;Children Available Help at Discharge: Family;Available 24 hours/day Type of Home: House Home Access: Stairs to enter   [employer] of Steps: 1   Home Layout: One level Home Equipment: Rollator (4 wheels);Cane - quad      Prior Function Prior Level of  Function : Independent/Modified Independent                     Extremity/Trunk Assessment   Upper Extremity Assessment Upper Extremity Assessment: Defer to OT evaluation    Lower Extremity Assessment Lower Extremity Assessment: Overall WFL for tasks assessed (Some generlaized post surgical weakness)    Cervical / Trunk Assessment Cervical / Trunk Assessment: Back Surgery  Communication   Communication Communication: No apparent difficulties  Cognition Arousal: Alert Behavior During Therapy: WFL for tasks assessed/performed Overall  Cognitive Status: Within Functional Limits for tasks assessed                                          General Comments General comments (skin integrity, edema, etc.): No family present. Pt eager to DC home today    Exercises     Assessment/Plan    PT Assessment Patient does not need any further PT services  PT Problem List         PT Treatment Interventions      PT Goals (Current goals can be found in the Care Plan section)       Frequency       Co-evaluation               AM-PAC PT "6 Clicks" Mobility  Outcome Measure Help needed turning from your back to your side while in a flat bed without using bedrails?: None Help needed moving from lying on your back to sitting on the side of a flat bed without using bedrails?: None Help needed moving to and from a bed to a chair (including a wheelchair)?: A Little Help needed standing up from a chair using your arms (e.g., wheelchair or bedside chair)?: A Little Help needed to walk in hospital room?: A Little Help needed climbing 3-5 steps with a railing? : A Little 6 Click Score: 20    End of Session Equipment Utilized During Treatment: Gait belt Activity Tolerance: Patient tolerated treatment well Patient left: in bed;with bed alarm set (Sitting EOB) Nurse Communication: Mobility status (No equipment or f/u PT needs) PT Visit Diagnosis: Unsteadiness on feet (R26.81)    Time: 4098-1191 PT Time Calculation (min) (ACUTE ONLY): 34 min   Charges:   PT Evaluation $PT Eval Moderate Complexity: 1 Mod PT Treatments $Gait Training: 8-22 mins PT General Charges $$ ACUTE PT VISIT: 1 Visit         Lavona Mound, PT   Acute Rehabilitation Services  Office [phone] 01/07/2023   Donnella Sham 01/07/2023, 8:46 AM

## 2023-01-07 NOTE — Discharge Instructions (Signed)
Spinal Fusion °Care After °Refer to this sheet in the next few weeks. These instructions provide you with information on caring for yourself after your procedure. Your caregiver may also give you more specific instructions. Your treatment has been planned according to current medical practices, but problems sometimes occur. Call your caregiver if you have any problems or questions after your procedure. °HOME CARE INSTRUCTIONS  °· Take whatever pain medicine has been prescribed by your caregiver. Do not take over-the-counter pain medicine unless directed otherwise by your caregiver.  °· Do not drive if you are taking narcotic pain medicines.  °· Change your bandage (dressing) if necessary or as directed by your caregiver.  °· You may shower. The wound may get wet, simply pat the area dry. It will take ~2 weeks for the glue to peel off. °· If you have been prescribed medicine to prevent your blood from clotting, follow the directions carefully.  °· Check the area around your incision often. Look for redness and swelling. Also, look for anything leaking from your wound. You can use a mirror or have a family member inspect your incision if it is in a place where it is difficult for you to see.  °· Ask your caregiver what activities you should avoid and for how long.  °· Walk as much as possible.  °· Do not lift anything heavier than 5 lbs until your caregiver says it is safe.  °· Do not twist or bend for a few weeks. Try not to pull on things. Avoid sitting for long periods of time. Change positions at least every hour.  °

## 2023-01-07 NOTE — Discharge Summary (Signed)
Physician Discharge Summary  Patient ID: Eugene [name] MRN: [id number] DOB/AGE: Oct 04, 1945 77 y.o.  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 Mossbarger was admitted and taken to the operating room for an uncomplicated lumbar decompression and fusion. Post op he is ambulating, voiding, and tolerating a regular diet.  His wound is clean, dry, and without signs of infection.   Treatments: surgery: LUMBAR FOUR-FIVE POSTERIOR LUMBAR INTERBODY FUSION With Half Dome X cages filled with autograft morsels, autograft morsels also placed into the disc space Laminectomy L4 beyond the needed exposure for a PLIF Non segmental pedicle screw fixation L4-5 7.5 mm x 40mm screws  Discharge Exam: Blood pressure (!) 119/58, pulse 82, temperature 98.5 F (36.9 C), temperature source Oral, resp. rate 19, height 5\' 10"  (1.778 m), weight 117.9 kg, SpO2 98%. General appearance: alert, cooperative, appears stated age, mild distress, and mildly obese  Disposition: Discharge disposition: 01-Home or Self Care      Spondylolisthesis  Allergies as of 01/07/2023       Reactions   Other Other (See Comments)   Adhesive tape causes skin to peel and darken.   Penicillins    Has patient had a PCN reaction causing immediate rash, facial/tongue/throat swelling, SOB or lightheadedness with hypotension:NO Has patient had a PCN reaction causing severe rash involving mucus membranes or skin necrosis:NO Has patient had a PCN reaction that required hospitalization NO Has patient had a PCN reaction occurring within the last 10 years: NO If all of the above answers are "NO", then may proceed with Cephalosporin use.   Latex Rash        Medication List      TAKE these medications    acetaminophen 650 MG CR tablet Commonly known as: TYLENOL Take 650 mg by mouth 2 (two) times daily.   allopurinol 100 MG tablet Commonly known as: ZYLOPRIM Take 100 mg by mouth daily.   amLODipine 10 MG tablet Commonly known as: NORVASC Take 10 mg by mouth daily.   atorvastatin 10 MG tablet Commonly known as: LIPITOR TAKE 1 TABLET EVERY DAY   azelastine 0.1 % nasal spray Commonly known as: ASTELIN 1-2 sprays each nostril every 12 hours if needed   b complex vitamins capsule Take 1 capsule by mouth daily.   brimonidine 0.2 % ophthalmic solution Commonly known as: ALPHAGAN Place 1 drop into both eyes 2 (two) times daily.   carvedilol 25 MG tablet Commonly known as: COREG Take 1 tablet (25 mg total) by mouth 2 (two) times daily with a meal.   COLON CLEANSE PO Take 1 tablet by mouth daily as needed (Constipation).   Comirnaty syringe Generic drug: COVID-19 mRNA vaccine 2023-2024 Inject into the muscle.   fluticasone 50 MCG/ACT nasal spray Commonly known as: FLONASE Place 1 spray into both nostrils daily as needed for rhinitis.   gabapentin 300 MG capsule Commonly known as: NEURONTIN Take 300 mg by mouth 2 (two) times daily.   IRON PO Take 1 tablet by mouth daily.   losartan-hydrochlorothiazide 50-12.5 MG tablet Commonly known as: HYZAAR Take 1 tablet by mouth 2 (two) times daily.   OVER THE COUNTER MEDICATION Take 3 tablets by mouth daily. Omega XL   oxyCODONE 5 MG immediate release tablet Commonly known as: Oxy IR/ROXICODONE Take 1 tablet (5 mg total) by mouth  every 6 (six) hours as needed for up to 8 days for moderate pain ((score 4 to 6)).   polyethylene glycol 17 g packet Commonly known as: MIRALAX / GLYCOLAX Take 17 g by mouth daily as needed for mild constipation or moderate constipation.   tamsulosin 0.4 MG Caps capsule Commonly known as: FLOMAX Take 0.4 mg by mouth every evening.   tiZANidine 4 MG tablet Commonly  known as: ZANAFLEX Take 1 tablet (4 mg total) by mouth every 6 (six) hours as needed for muscle spasms.        Follow-up Information     Coletta Memos, MD Follow up.   Specialty: Neurosurgery Why: keep your scheduled appointment Contact information: [address] [place] [address] [phone]                 Signed: Coletta Memos 01/07/2023, 8:38 AM

## 2023-01-08 ENCOUNTER — Observation Stay (HOSPITAL_COMMUNITY): Payer: Medicare HMO

## 2023-01-08 ENCOUNTER — Encounter (HOSPITAL_COMMUNITY): Payer: Self-pay

## 2023-01-08 ENCOUNTER — Inpatient Hospital Stay (HOSPITAL_COMMUNITY)
Admission: EM | Admit: 2023-01-08 | Discharge: 2023-01-11 | DRG: 460 | Disposition: A | Discharge status: Home Health | Payer: Medicare HMO | Attending: Internal Medicine | Admitting: Internal Medicine

## 2023-01-08 DIAGNOSIS — D638 Anemia in other chronic diseases classified elsewhere: Secondary | ICD-10-CM

## 2023-01-08 DIAGNOSIS — Z8249 Family history of ischemic heart disease and other diseases of the circulatory system: Secondary | ICD-10-CM

## 2023-01-08 DIAGNOSIS — M4316 Spondylolisthesis, lumbar region: Secondary | ICD-10-CM | POA: Diagnosis present

## 2023-01-08 DIAGNOSIS — D539 Nutritional anemia, unspecified: Secondary | ICD-10-CM | POA: Diagnosis not present

## 2023-01-08 DIAGNOSIS — Z5986 Financial insecurity: Secondary | ICD-10-CM

## 2023-01-08 DIAGNOSIS — C9110 Chronic lymphocytic leukemia of B-cell type not having achieved remission: Secondary | ICD-10-CM | POA: Diagnosis present

## 2023-01-08 DIAGNOSIS — C61 Malignant neoplasm of prostate: Secondary | ICD-10-CM | POA: Diagnosis present

## 2023-01-08 DIAGNOSIS — R195 Other fecal abnormalities: Secondary | ICD-10-CM | POA: Diagnosis not present

## 2023-01-08 DIAGNOSIS — K922 Gastrointestinal hemorrhage, unspecified: Secondary | ICD-10-CM | POA: Diagnosis present

## 2023-01-08 DIAGNOSIS — Z79899 Other long term (current) drug therapy: Secondary | ICD-10-CM

## 2023-01-08 DIAGNOSIS — N1832 Chronic kidney disease, stage 3b: Secondary | ICD-10-CM

## 2023-01-08 DIAGNOSIS — M48062 Spinal stenosis, lumbar region with neurogenic claudication: Principal | ICD-10-CM | POA: Diagnosis present

## 2023-01-08 DIAGNOSIS — Z923 Personal history of irradiation: Secondary | ICD-10-CM

## 2023-01-08 DIAGNOSIS — K219 Gastro-esophageal reflux disease without esophagitis: Secondary | ICD-10-CM

## 2023-01-08 DIAGNOSIS — R32 Unspecified urinary incontinence: Secondary | ICD-10-CM | POA: Diagnosis not present

## 2023-01-08 DIAGNOSIS — Z531 Procedure and treatment not carried out because of patient's decision for reasons of belief and group pressure: Secondary | ICD-10-CM | POA: Diagnosis present

## 2023-01-08 DIAGNOSIS — M5030 Other cervical disc degeneration, unspecified cervical region: Secondary | ICD-10-CM | POA: Diagnosis present

## 2023-01-08 DIAGNOSIS — N179 Acute kidney failure, unspecified: Secondary | ICD-10-CM

## 2023-01-08 DIAGNOSIS — Z880 Allergy status to penicillin: Secondary | ICD-10-CM

## 2023-01-08 DIAGNOSIS — Z8546 Personal history of malignant neoplasm of prostate: Secondary | ICD-10-CM

## 2023-01-08 DIAGNOSIS — R531 Weakness: Principal | ICD-10-CM

## 2023-01-08 DIAGNOSIS — G4733 Obstructive sleep apnea (adult) (pediatric): Secondary | ICD-10-CM | POA: Diagnosis present

## 2023-01-08 DIAGNOSIS — I10 Essential (primary) hypertension: Secondary | ICD-10-CM | POA: Diagnosis present

## 2023-01-08 DIAGNOSIS — Z91040 Latex allergy status: Secondary | ICD-10-CM

## 2023-01-08 DIAGNOSIS — I129 Hypertensive chronic kidney disease with stage 1 through stage 4 chronic kidney disease, or unspecified chronic kidney disease: Secondary | ICD-10-CM | POA: Diagnosis present

## 2023-01-08 DIAGNOSIS — J310 Chronic rhinitis: Secondary | ICD-10-CM | POA: Diagnosis present

## 2023-01-08 DIAGNOSIS — D509 Iron deficiency anemia, unspecified: Secondary | ICD-10-CM

## 2023-01-08 DIAGNOSIS — D649 Anemia, unspecified: Secondary | ICD-10-CM

## 2023-01-08 DIAGNOSIS — E860 Dehydration: Secondary | ICD-10-CM | POA: Diagnosis present

## 2023-01-08 DIAGNOSIS — E782 Mixed hyperlipidemia: Secondary | ICD-10-CM | POA: Diagnosis present

## 2023-01-08 DIAGNOSIS — Q928 Other specified trisomies and partial trisomies of autosomes: Secondary | ICD-10-CM

## 2023-01-08 DIAGNOSIS — R12 Heartburn: Secondary | ICD-10-CM | POA: Diagnosis present

## 2023-01-08 DIAGNOSIS — M4310 Spondylolisthesis, site unspecified: Secondary | ICD-10-CM

## 2023-01-08 LAB — RETICULOCYTES
Immature Retic Fract: 15 % (ref 2.3–15.9)
RBC.: 2.7 MIL/uL — ABNORMAL LOW (ref 4.22–5.81)
Retic Count, Absolute: 57.5 10*3/uL (ref 19.0–186.0)
Retic Ct Pct: 2.1 % (ref 0.4–3.1)

## 2023-01-08 LAB — BASIC METABOLIC PANEL
Anion gap: 10 (ref 5–15)
BUN: 40 mg/dL — ABNORMAL HIGH (ref 8–23)
CO2: 23 mmol/L (ref 22–32)
Calcium: 8.3 mg/dL — ABNORMAL LOW (ref 8.9–10.3)
Chloride: 106 mmol/L (ref 98–111)
Creatinine, Ser: 2.3 mg/dL — ABNORMAL HIGH (ref 0.61–1.24)
GFR, Estimated: 29 mL/min — ABNORMAL LOW (ref >60)
Glucose, Bld: 94 mg/dL (ref 70–99)
Potassium: 3.8 mmol/L (ref 3.5–5.1)
Sodium: 139 mmol/L (ref 135–145)

## 2023-01-08 LAB — CBC
HCT: 27.8 % — ABNORMAL LOW (ref 39.0–52.0)
Hemoglobin: 9 g/dL — ABNORMAL LOW (ref 13.0–17.0)
MCH: 34.1 pg — ABNORMAL HIGH (ref 26.0–34.0)
MCHC: 32.4 g/dL (ref 30.0–36.0)
MCV: 105.3 fL — ABNORMAL HIGH (ref 80.0–100.0)
Platelets: 118 10*3/uL — ABNORMAL LOW (ref 150–400)
RBC: 2.64 MIL/uL — ABNORMAL LOW (ref 4.22–5.81)
RDW: 13.1 % (ref 11.5–15.5)
WBC: 36.3 10*3/uL — ABNORMAL HIGH (ref 4.0–10.5)
nRBC: 0 % (ref 0.0–0.2)

## 2023-01-08 LAB — URINALYSIS, ROUTINE W REFLEX MICROSCOPIC
Bilirubin Urine: NEGATIVE
Glucose, UA: NEGATIVE mg/dL
Hgb urine dipstick: NEGATIVE
Ketones, ur: NEGATIVE mg/dL
Leukocytes,Ua: NEGATIVE
Nitrite: NEGATIVE
Protein, ur: NEGATIVE mg/dL
Specific Gravity, Urine: 1.016 (ref 1.005–1.030)
pH: 5 (ref 5.0–8.0)

## 2023-01-08 LAB — CBC WITH DIFFERENTIAL/PLATELET
Abs Immature Granulocytes: 0.13 10*3/uL — ABNORMAL HIGH (ref 0.00–0.07)
Basophils Absolute: 0.1 10*3/uL (ref 0.0–0.1)
Basophils Relative: 0 %
Eosinophils Absolute: 0 10*3/uL (ref 0.0–0.5)
Eosinophils Relative: 0 %
HCT: 30.1 % — ABNORMAL LOW (ref 39.0–52.0)
Hemoglobin: 9.7 g/dL — ABNORMAL LOW (ref 13.0–17.0)
Immature Granulocytes: 0 %
Lymphocytes Relative: 50 %
Lymphs Abs: 18.9 10*3/uL — ABNORMAL HIGH (ref 0.7–4.0)
MCH: 32.9 pg (ref 26.0–34.0)
MCHC: 32.2 g/dL (ref 30.0–36.0)
MCV: 102 fL — ABNORMAL HIGH (ref 80.0–100.0)
Monocytes Absolute: 11 10*3/uL — ABNORMAL HIGH (ref 0.1–1.0)
Monocytes Relative: 29 %
Neutro Abs: 8 10*3/uL — ABNORMAL HIGH (ref 1.7–7.7)
Neutrophils Relative %: 21 %
Platelets: 136 10*3/uL — ABNORMAL LOW (ref 150–400)
RBC: 2.95 MIL/uL — ABNORMAL LOW (ref 4.22–5.81)
RDW: 13.1 % (ref 11.5–15.5)
Smear Review: DECREASED
WBC: 38.1 10*3/uL — ABNORMAL HIGH (ref 4.0–10.5)
nRBC: 0 % (ref 0.0–0.2)

## 2023-01-08 LAB — FERRITIN: Ferritin: 119 ng/mL (ref 24–336)

## 2023-01-08 LAB — IRON AND TIBC
Iron: 15 ug/dL — ABNORMAL LOW (ref 45–182)
Saturation Ratios: 7 % — ABNORMAL LOW (ref 17.9–39.5)
TIBC: 214 ug/dL — ABNORMAL LOW (ref 250–450)
UIBC: 199 ug/dL

## 2023-01-08 LAB — CREATININE, URINE, RANDOM: Creatinine, Urine: 234 mg/dL

## 2023-01-08 LAB — POC OCCULT BLOOD, ED: Fecal Occult Bld: POSITIVE — AB

## 2023-01-08 LAB — FOLATE: Folate: 32 ng/mL (ref >5.9)

## 2023-01-08 LAB — LACTATE DEHYDROGENASE: LDH: 167 U/L (ref 98–192)

## 2023-01-08 LAB — SODIUM, URINE, RANDOM: Sodium, Ur: 29 mmol/L

## 2023-01-08 LAB — VITAMIN B12: Vitamin B-12: 598 pg/mL (ref 180–914)

## 2023-01-08 MED ORDER — ONDANSETRON HCL 4 MG/2ML IJ SOLN: 4.0000 mg | Freq: Four times a day (QID) | INTRAMUSCULAR | Status: DC | PRN

## 2023-01-08 MED ORDER — FERROUS SULFATE 325 (65 FE) MG PO TABS: 325.0000 mg | ORAL_TABLET | Freq: Every day | ORAL | Status: DC

## 2023-01-08 MED ORDER — BRIMONIDINE TARTRATE 0.2 % OP SOLN
1.0000 [drp] | Freq: Two times a day (BID) | OPHTHALMIC | Status: DC
  Administered 2023-01-08 – 2023-01-11 (×6): 1 [drp] via OPHTHALMIC
  Filled 2023-01-08: qty 5

## 2023-01-08 MED ORDER — GABAPENTIN 100 MG PO CAPS
100.0000 mg | ORAL_CAPSULE | Freq: Two times a day (BID) | ORAL | Status: DC
  Administered 2023-01-08 – 2023-01-11 (×6): 100 mg via ORAL
  Filled 2023-01-08 (×6): qty 1

## 2023-01-08 MED ORDER — ATORVASTATIN CALCIUM 10 MG PO TABS
10.0000 mg | ORAL_TABLET | Freq: Every day | ORAL | Status: DC
  Administered 2023-01-09 – 2023-01-11 (×3): 10 mg via ORAL
  Filled 2023-01-08 (×3): qty 1

## 2023-01-08 MED ORDER — ONDANSETRON HCL 4 MG PO TABS: 4.0000 mg | ORAL_TABLET | Freq: Four times a day (QID) | ORAL | Status: DC | PRN

## 2023-01-08 MED ORDER — TAMSULOSIN HCL 0.4 MG PO CAPS
0.4000 mg | ORAL_CAPSULE | Freq: Every evening | ORAL | Status: DC
  Administered 2023-01-08 – 2023-01-10 (×3): 0.4 mg via ORAL
  Filled 2023-01-08 (×3): qty 1

## 2023-01-08 MED ORDER — ACETAMINOPHEN 650 MG RE SUPP: 650.0000 mg | Freq: Four times a day (QID) | RECTAL | Status: DC | PRN

## 2023-01-08 MED ORDER — SODIUM CHLORIDE 0.9 % IV SOLN
INTRAVENOUS | Status: DC
  Administered 2023-01-08: 75 mL/h via INTRAVENOUS

## 2023-01-08 MED ORDER — PANTOPRAZOLE SODIUM 40 MG IV SOLR
40.0000 mg | INTRAVENOUS | Status: DC
  Administered 2023-01-09 – 2023-01-10 (×2): 40 mg via INTRAVENOUS
  Filled 2023-01-08 (×2): qty 10

## 2023-01-08 MED ORDER — PANTOPRAZOLE SODIUM 40 MG IV SOLR
40.0000 mg | Freq: Once | INTRAVENOUS | Status: AC
  Administered 2023-01-08: 40 mg via INTRAVENOUS
  Filled 2023-01-08: qty 10

## 2023-01-08 MED ORDER — ACETAMINOPHEN 325 MG PO TABS: 650.0000 mg | ORAL_TABLET | Freq: Four times a day (QID) | ORAL | Status: DC | PRN

## 2023-01-08 MED ORDER — ACETAMINOPHEN 500 MG PO TABS
1000.0000 mg | ORAL_TABLET | Freq: Once | ORAL | Status: AC
  Administered 2023-01-08: 1000 mg via ORAL
  Filled 2023-01-08: qty 2

## 2023-01-08 MED ORDER — SODIUM CHLORIDE 0.9 % IV BOLUS
500.0000 mL | Freq: Once | INTRAVENOUS | Status: AC
  Administered 2023-01-08: 500 mL via INTRAVENOUS

## 2023-01-08 MED ORDER — OXYCODONE HCL 5 MG PO TABS: 5.0000 mg | ORAL_TABLET | ORAL | Status: DC | PRN

## 2023-01-08 MED ORDER — AZELASTINE HCL 0.1 % NA SOLN: 1.0000 | Freq: Two times a day (BID) | NASAL | Status: DC | PRN

## 2023-01-08 MED ORDER — ALLOPURINOL 100 MG PO TABS
100.0000 mg | ORAL_TABLET | Freq: Every day | ORAL | Status: DC
  Administered 2023-01-09 – 2023-01-11 (×3): 100 mg via ORAL
  Filled 2023-01-08 (×3): qty 1

## 2023-01-08 NOTE — ED Notes (Addendum)
PT states he had back surgery on Friday and states his medication has been making him too drowsy. PT states he is taking prescribed pain medications of roxycodone and zanaflex, Famly at bedside states PT is also taking his prescribed medication of gabapentin, same endorses PT has taken all of these medications at the same time.

## 2023-01-08 NOTE — Assessment & Plan Note (Signed)
Followed at [facility] oncology with Dr. Neil Crouch  Prognostic markers showed trisomy 12, deletion of q13, and a NOTCH 1 mutation. He has had progressive anemia and unclear if this is related to chronic kidney disease or progressive CLL. Previous w/u unremarkable for hemolytic anemia SPEP was negative for M spike and immunofixation was also normal.  Last seen in 09/2022 and declined BM biopsy at that time

## 2023-01-08 NOTE — ED Triage Notes (Signed)
Pt c/o bilateral leg cramping, difficulty urinating, and increased fatigue after taking newly prescribed medications x2 days.  Pain score 3/10.  Pt has a lumbar fusion x2 days ago.    Pt newly prescribed oxycodone and tizanidine.  Pt reports he quickly falls asleep and is hard to wake after taking medications.

## 2023-01-08 NOTE — Assessment & Plan Note (Signed)
He is s/p lumbar decompression and fusion secondary to spondylolisthesis L4/L5 on 01/06/23 by Dr. Franky Macho.  No bleeding from site D/c on 01/06/22. POD #3 Neurosurgery aware, no recommendations PT to see

## 2023-01-08 NOTE — Assessment & Plan Note (Signed)
Continue lipitor 10mg daily  

## 2023-01-08 NOTE — Assessment & Plan Note (Addendum)
Baseline hgb ranges from 10-12 Was 11.1 on 8/23 and now 9.7 No signs/symptoms of bleeding, but fecal occult positive Last scope in 2009, recall 10 years GI consulted by EDP Check iron studies/b12/folate. Leaning more toward dilutional/s/p surgery or CKD  Adding on haptoglobin, retic count, LDH and smear pending to r/o hemolytic anemia  Hemolytic anemia w/u negative with oncologist in May SPEP was negative for M spike and immunofixation normal  Trend CBC Jehovah witness and doesn't want any blood products

## 2023-01-08 NOTE — Assessment & Plan Note (Addendum)
77 year old presenting to ED with one day history of fatigue, intermittent drowsiness, muscle cramping in his legs s/p lumbar decompression and fusion secondary to spondylolisthesis L4/L5 on 01/06/23 by Dr. Franky Macho.  -obs to progressive  -check renal US/urine studies -baseline creatinine around 1.4 -UA pending  -strict I/O -avoid  nephrotoxic drugs and hold his home HTN meds (hyzaar)  -stop tizanidine  -hypotensive when arrived, ? Some hypoperfusion contributing. Hold home bp meds for now. Check orthostatics  -gentle IVF  -trend

## 2023-01-08 NOTE — Plan of Care (Signed)

## 2023-01-08 NOTE — Progress Notes (Signed)
Patient and family declined the EKG right now, because the patient is eating.  Stressed that it may be a few hours, due to shift change before the EKG could be performed if not performed now.  The patient stated, "I'll be here all night".

## 2023-01-08 NOTE — Assessment & Plan Note (Signed)
S/p radiation therapy

## 2023-01-08 NOTE — Consult Note (Addendum)
Consultation  Referring Provider:   Dr. Artis Flock Primary Care Physician:  Ellyn Hack, MD Primary Gastroenterologist: Dr. Russella Dar        Reason for Consultation: Anemia with heme positive stool            HPI:   Eugene [name] is a 77 y.o. male with past medical history of CLL, prostate cancer, GERD, hypertension, spinal stenosis of lumbar region at multiple levels resulting in lumbar fusion surgery on 8/23 as well as a history of spondylolisthesis at L4-5 who presented to the ED for evaluation of fatigue and leg cramping.  We are consulted in regards to a finding of drop in hemoglobin and heme positive stool.    01/06/2023 patient underwent a lumbar spinal fusion surgery and he was discharged home the same day.  Last night he started with fatigue and increased back pain.  He tried taking his Oxycodone as well as Tizanidine and Gabapentin but continues with pain.  He had 1 episode of urinary incontinence.    Today, patient is seen on hospitalist is in the room.  He explains that he has had some ongoing pain regardless of pain medicines at home and fatigue as well as leg cramping since his surgery on 01/06/2023.  This is what brought him to the ER.  He has not been using any NSAIDs.  Not even prior to his procedure other than one Ibuprofen.  Denies seeing any black tarry stool or bright red blood in his stool.  He is typically constipated and in fact took some over-the-counter laxative (cannot remember what) on Wednesday and Thursday leading up to his surgery in order to have a bowel movement.  His last bowel movement was on 8/22.  Daughter relays that he is typically constipated.  He denies any increase in abdominal pain.  Does tell me that over the past 3 weeks he has had an increase in heartburn and reflux.  He has not taken anything over-the-counter for this.  It is typically after eating.  Nothing at this very moment.    Denies fever, chills, weight loss, nausea, vomiting or abdominal pain.  ER  course: Hemoglobin 9.7 (11.1 on 01/06/2023), BUN 40, MCV 102, platelets 136, BUN 40, patient is a [employer] and is deferring blood transfusion, also found to be in AKI with a creatinine of 2.3 (1.372 days ago); Hemoccult positive; iron studies pending  GI history: 03/05/2015 office visit with Dr. Russella Dar for right lower quadrant pain; at that time symptoms felt to be musculoskeletal and he was told to follow-up with his PCP and continue Advil and Aspercreme 04/30/2008 colonoscopy by Dr. Jarold Motto with a sessile polyp found to be hyperplastic and repeat recommended in 10 years.  Past Medical History:  Diagnosis Date   Allergic rhinitis due to pollen    Allergy    seasonal   Anxiety    Blood transfusion without reported diagnosis    do not except blood transfusions.   Cataract    beginning stage   CLL (chronic lymphocytic leukemia) (HCC)    DDD (degenerative disc disease), cervical    Severe w/ osteophytes on xray 3/16   Difficult intubation    ED (erectile dysfunction)    GERD (gastroesophageal reflux disease)    Heart murmur    History of Holter monitoring    started on march 19,2020   Hypertension    OBESITY    Osteoarthritis    Prostate cancer (HCC)    Pulmonary stenosis,  valvar    TEE 07/2011: stable, LVEF 55%, small PFO   Refusal of blood transfusions as patient is Jehovah's Witness    Rheumatic fever    residual pulm stenosis, follows with ganji every 42mo   RHINITIS, CHRONIC    SLEEP APNEA, OBSTRUCTIVE dx 2000   NPSG 2000:  AHI 76/hr CPAP titrated to 11cm 2002     Past Surgical History:  Procedure Laterality Date   BACK SURGERY  2021   COLONOSCOPY     PROSTATE BIOPSY     ROTATOR CUFF REPAIR Right    2018   TEE WITHOUT CARDIOVERSION  08/02/2011   Procedure: TRANSESOPHAGEAL ECHOCARDIOGRAM (TEE);  Surgeon: Pamella Pert, MD;  Location: [facility] ENDOSCOPY;  Service: Cardiovascular;  Laterality: N/A;    Family History  Problem Relation Age of Onset   Heart  disease Mother    Hypertension Other    Melanoma Brother    Heart disease Brother    Colon cancer Neg Hx    Stomach cancer Neg Hx    Rectal cancer Neg Hx    Esophageal cancer Neg Hx    Prostate cancer Neg Hx    Pancreatic cancer Neg Hx     Social History   Tobacco Use   Smoking status: Never   Smokeless tobacco: Never  Vaping Use   Vaping status: Never Used  Substance Use Topics   Alcohol use: Yes    Comment: Only on weekends liquor (mixed drink)   Drug use: No    Prior to Admission medications   Medication Sig Start Date End Date Taking? Authorizing Provider  acetaminophen (TYLENOL) 650 MG CR tablet Take 650 mg by mouth 2 (two) times daily.    [provider]  allopurinol (ZYLOPRIM) 100 MG tablet Take 100 mg by mouth daily. 05/17/17   [provider]  amLODipine (NORVASC) 10 MG tablet Take 10 mg by mouth daily. 08/10/18   [provider]  atorvastatin (LIPITOR) 10 MG tablet TAKE 1 TABLET EVERY DAY 01/21/21   Yates Decamp, MD  azelastine (ASTELIN) 0.1 % nasal spray 1-2 sprays each nostril every 12 hours if needed 11/11/19   Jetty Duhamel D, MD  b complex vitamins capsule Take 1 capsule by mouth daily.    [provider]  brimonidine (ALPHAGAN) 0.2 % ophthalmic solution Place 1 drop into both eyes 2 (two) times daily. 01/25/22   [provider]  carvedilol (COREG) 25 MG tablet Take 1 tablet (25 mg total) by mouth 2 (two) times daily with a meal. 01/20/15   Newt Lukes, MD  COVID-19 mRNA vaccine [phone] (COMIRNATY) syringe Inject into the muscle. 03/07/22   Judyann Munson, MD  Ferrous Sulfate (IRON PO) Take 1 tablet by mouth daily.    [provider]  fluticasone (FLONASE) 50 MCG/ACT nasal spray Place 1 spray into both nostrils daily as needed for rhinitis.    [provider]  gabapentin (NEURONTIN) 300 MG capsule Take 300 mg by mouth 2 (two) times daily. 03/22/21   [provider]   losartan-hydrochlorothiazide (HYZAAR) 50-12.5 MG per tablet Take 1 tablet by mouth 2 (two) times daily. 01/20/15   Newt Lukes, MD  Misc Natural Products (COLON CLEANSE PO) Take 1 tablet by mouth daily as needed (Constipation).    [provider]  OVER THE COUNTER MEDICATION Take 3 tablets by mouth daily. Omega XL    [provider]  oxyCODONE (OXY IR/ROXICODONE) 5 MG immediate release tablet Take 1 tablet (5 mg total)  by mouth every 6 (six) hours as needed for up to 8 days for moderate pain ((score 4 to 6)). 01/07/23 01/15/23  Coletta Memos, MD  polyethylene glycol (MIRALAX / GLYCOLAX) 17 g packet Take 17 g by mouth daily as needed for mild constipation or moderate constipation.    [provider]  tamsulosin (FLOMAX) 0.4 MG CAPS capsule Take 0.4 mg by mouth every evening. 05/23/19   [provider]  tiZANidine (ZANAFLEX) 4 MG tablet Take 1 tablet (4 mg total) by mouth every 6 (six) hours as needed for muscle spasms. 01/07/23   Coletta Memos, MD    No current facility-administered medications for this encounter.   Current Outpatient Medications  Medication Sig Dispense Refill   acetaminophen (TYLENOL) 650 MG CR tablet Take 650 mg by mouth 2 (two) times daily.     allopurinol (ZYLOPRIM) 100 MG tablet Take 100 mg by mouth daily.     amLODipine (NORVASC) 10 MG tablet Take 10 mg by mouth daily.     atorvastatin (LIPITOR) 10 MG tablet TAKE 1 TABLET EVERY DAY 90 tablet 3   azelastine (ASTELIN) 0.1 % nasal spray 1-2 sprays each nostril every 12 hours if needed 90 mL 4   b complex vitamins capsule Take 1 capsule by mouth daily.     brimonidine (ALPHAGAN) 0.2 % ophthalmic solution Place 1 drop into both eyes 2 (two) times daily.     carvedilol (COREG) 25 MG tablet Take 1 tablet (25 mg total) by mouth 2 (two) times daily with a meal. 180 tablet 1   COVID-19 mRNA vaccine 2023-2024 (COMIRNATY) syringe Inject into the muscle. 0.3 mL 0   Ferrous Sulfate (IRON PO) Take 1  tablet by mouth daily.     fluticasone (FLONASE) 50 MCG/ACT nasal spray Place 1 spray into both nostrils daily as needed for rhinitis.     gabapentin (NEURONTIN) 300 MG capsule Take 300 mg by mouth 2 (two) times daily.     losartan-hydrochlorothiazide (HYZAAR) 50-12.5 MG per tablet Take 1 tablet by mouth 2 (two) times daily. 180 tablet 1   Misc Natural Products (COLON CLEANSE PO) Take 1 tablet by mouth daily as needed (Constipation).     OVER THE COUNTER MEDICATION Take 3 tablets by mouth daily. Omega XL     oxyCODONE (OXY IR/ROXICODONE) 5 MG immediate release tablet Take 1 tablet (5 mg total) by mouth every 6 (six) hours as needed for up to 8 days for moderate pain ((score 4 to 6)). 30 tablet 0   polyethylene glycol (MIRALAX / GLYCOLAX) 17 g packet Take 17 g by mouth daily as needed for mild constipation or moderate constipation.     tamsulosin (FLOMAX) 0.4 MG CAPS capsule Take 0.4 mg by mouth every evening.     tiZANidine (ZANAFLEX) 4 MG tablet Take 1 tablet (4 mg total) by mouth every 6 (six) hours as needed for muscle spasms. 60 tablet 0    Allergies as of 01/08/2023 - Review Complete 01/08/2023  Allergen Reaction Noted   Other Other (See Comments) 03/28/2014   Penicillins  04/22/2008   Latex Rash 11/26/2020     Review of Systems:    Constitutional: No weight loss, fever or chills Skin: No rash Cardiovascular: No chest pain Respiratory: No SOB  Gastrointestinal: See HPI and otherwise negative Genitourinary: No dysuria Neurological: No headache, dizziness or syncope Musculoskeletal: No new muscle or joint pain Hematologic: No bleeding  Psychiatric: No history of depression or anxiety    Physical Exam:  Vital  signs in last 24 hours: Temp:  [98 F (36.7 C)-98.2 F (36.8 C)] 98.2 F (36.8 C) (08/25 1413) Pulse Rate:  [84-85] 84 (08/25 1430) Resp:  [18-22] 22 (08/25 1430) BP: (91-108)/(45-60) 105/60 (08/25 1430) SpO2:  [96 %-100 %] 100 % (08/25 1430) Weight:  [117.9 kg] 117.9  kg (08/25 1349)   General:   Pleasant AA male appears to be in NAD, Well developed, Well nourished, alert and cooperative Head:  Normocephalic and atraumatic. Eyes:   PEERL, EOMI. No icterus. Conjunctiva pink. Ears:  Normal auditory acuity. Neck:  Supple Throat: Oral cavity and pharynx without inflammation, swelling or lesion. Teeth in good condition. Lungs: Respirations even and unlabored. Lungs clear to auscultation bilaterally.   No wheezes, crackles, or rhonchi.  Heart: Normal S1, S2. No MRG. Regular rate and rhythm. No peripheral edema, cyanosis or pallor.  Abdomen:  Soft, nondistended, nontender. No rebound or guarding. Normal bowel sounds. No appreciable masses or hepatomegaly. Rectal:  Not performed. Per Er physician hemoccult + Msk:  Symmetrical without gross deformities. Peripheral pulses intact.  Extremities:  Without edema, no deformity or joint abnormality. Normal ROM, normal sensation. Neurologic:  Alert and  oriented x4;  grossly normal neurologically. Skin:   Dry and intact without significant lesions or rashes. Psychiatric: Demonstrates good judgement and reason without abnormal affect or behaviors.   LAB RESULTS: Recent Labs    01/06/23 2033 01/08/23 1358  WBC 38.3* 38.1*  HGB 11.1* 9.7*  HCT 34.5* 30.1*  PLT 153 136*   BMET Recent Labs    01/06/23 2033 01/08/23 1358  NA  --  139  K  --  3.8  CL  --  106  CO2  --  23  GLUCOSE  --  94  BUN  --  40*  CREATININE 1.37* 2.30*  CALCIUM  --  8.3*   STUDIES: DG Lumbar Spine 2-3 Views  Result Date: 01/06/2023 CLINICAL DATA:  Elective surgery. EXAM: LUMBAR SPINE - 2-3 VIEW COMPARISON:  None Available. FINDINGS: Nine fluoroscopic spot views of the lumbar spine obtained in the operating room. Pedicle screws at L4 and L5 with interbody spacer. Fluoroscopy time 89 seconds. Dose CT 8.68 mGy. IMPRESSION: Intraoperative fluoroscopy during L4-5 fusion. Electronically Signed   By: Narda Rutherford M.D.   On: 01/06/2023  19:29   DG C-Arm 1-60 Min-No Report  Result Date: 01/06/2023 Fluoroscopy was utilized by the requesting physician.  No radiographic interpretation.   DG C-Arm 1-60 Min-No Report  Result Date: 01/06/2023 Fluoroscopy was utilized by the requesting physician.  No radiographic interpretation.     Impression / Plan:   Impression: 1.  Acute blood loss anemia: Hemoglobin 11.1 on 8/23--> 9.1 at admission, Hemoccult positive, no signs of acute GI bleed, not on anticoagulation, recent spinal surgery, MCV actually elevated at 102, BUN up to 40, creatinine up to 2.3, last colonoscopy in 2009 with hyperplastic polyp and otherwise normal, did lose 600cc of blood during surgery per their note; more than likely anemia is from recent spinal surgery and possibly CLL contributing and less likely a GI bleed 2.  Recent spinal fusion: L4-L5 on 01/06/2023 3.  AKI 4.  CLL: Question contributing to anemia 5.  GERD: Patient tells me increasing symptoms over the past 3 weeks, will add Pantoprazole 40 daily   Plan: 1.  At this time I believe would be hard for patient to do a bowel prep given his recent spinal surgery.  He also certainly has other factors that are playing a  role in his current anemia.  At some point would benefit from an EGD and colonoscopy though this may be pursued as an outpatient after he is healed from acute spinal fusion and if he remains anemic. 2.  Please continue to observe the patient for any signs of acute GI bleed or drop in hemoglobin.  At that time we may need to rediscuss timing of GI procedures. 3.  Patient is a Scientist, product/process development.  He has discussed this with hospitalist team.  They are planning on observing his hemoglobin with iron infusions as needed.  Currently iron studies are pending. 4.  Patient can be on regular diet from a GI standpoint. 5.  Added Pantoprazole 40 mg IV once today and then once daily starting at 7:00 AM tomorrow  Thank you for your kind consultation, we will  continue to follow.  Dr. Barron Alvine will be taking over our service tomorrow.  Violet Baldy [facility]  01/08/2023, 3:46 PM     [facility] Attending   I have taken an interval history, reviewed the chart and examined the patient. I agree with the Advanced Practitioner's note, impression and recommendations with the following additions:  The patient has a heme positive stool of unclear significance.  I do not think it is related to his current issues.  He had 600  cc of blood loss documented with his recent spinal surgery.  That could account for the approximate 1 g drop in hemoglobin so could his AKI contribute to that.  I would avoid EGD and colonoscopy in this setting unless something changes and it is dictated.  We will follow-up tomorrow to make sure there are no other issues and to see what the anemia workup shows.  Patient and family are in agreement with this plan.  Iva Boop, MD, [facility] See Loretha Stapler on call - gastroenterology for best contact person 01/08/2023 5:24 PM

## 2023-01-08 NOTE — H&P (Signed)
History and Physical    Patient: Eugene [name] ZOX:[id number] DOB: [birth year] DOA: 01/08/2023 DOS: the patient was seen and examined on 01/08/2023 PCP: Ellyn Hack, MD  Patient coming from: Home - lives with his wife and daughter. Ambulates independently.    Chief Complaint: fatigue, weakness, leg cramps  HPI: Eugene [name] is a 77 y.o. male with medical history significant of CLL, GERD, HTN, hx of prostate cancer, hx of rheumatic fever with residual pulmonary stenosis, OSA on cpap who presented to ED with complaints of difficulty urinating, leg cramping, increased fatigue x 2 days. He is s/p lumbar decompression and fusion secondary to spondylolisthesis L4/L5 on 01/06/23 by Dr. Franky Macho. He is eating and drinking okay. He was given oxycodone and a muscle relaxer as well as gabapentin. Last night he took his medication he couldn't get out of the chair because he couldn't control his movements. Family describes him as dosing off intermittently, but he thought he was awake. They had to help him back to bed. This morning he continued to be drowsy and they noticed a new tremor in his left arm.  He also states his legs have been cramping and some tingling. The cramping alternates to both legs. He had trouble getting to the bathroom and had one episode of incontinence, but this was because he couldn't move fast enough. His urine does appear darker. He feels like he has normal urine output. Unsure if he has CVA tenderness as he just had surgery, but prior to surgery no CVA tenderness. No fever/chills, confusion.   He does not take ibuprofen.     Denies any fever/chills, vision changes/headaches, chest pain or palpitations, shortness of breath or cough, abdominal pain, N/V/D, dysuria or leg swelling.   He does not smoke or drink alcohol.   ER Course:  vitals: afebrile, bp: 91/45, HR 85, RR: 18, oxygen: 96%RA Pertinent labs: wbc: 38.1, hgb: 9.7, platelets: 136, BUN: 40, creatinine: 2.30,  In ED:  neurosurgery called. Given 500cc bolus and TRH asked to admit.    Review of Systems: As mentioned in the history of present illness. All other systems reviewed and are negative. Past Medical History:  Diagnosis Date   Allergic rhinitis due to pollen    Allergy    seasonal   Anxiety    Blood transfusion without reported diagnosis    do not except blood transfusions.   Cataract    beginning stage   CLL (chronic lymphocytic leukemia) (HCC)    DDD (degenerative disc disease), cervical    Severe w/ osteophytes on xray 3/16   Difficult intubation    ED (erectile dysfunction)    GERD (gastroesophageal reflux disease)    Heart murmur    History of Holter monitoring    started on march 19,2020   Hypertension    OBESITY    Osteoarthritis    Prostate cancer (HCC)    Pulmonary stenosis, valvar    TEE 07/2011: stable, LVEF 55%, small PFO   Refusal of blood transfusions as patient is Jehovah's Witness    Rheumatic fever    residual pulm stenosis, follows with ganji every 74mo   RHINITIS, CHRONIC    SLEEP APNEA, OBSTRUCTIVE dx 2000   NPSG 2000:  AHI 76/hr CPAP titrated to 11cm 2002    Past Surgical History:  Procedure Laterality Date   BACK SURGERY  2021   COLONOSCOPY     PROSTATE BIOPSY     ROTATOR CUFF REPAIR Right    2018   TEE  WITHOUT CARDIOVERSION  08/02/2011   Procedure: TRANSESOPHAGEAL ECHOCARDIOGRAM (TEE);  Surgeon: Pamella Pert, MD;  Location: [facility] ENDOSCOPY;  Service: Cardiovascular;  Laterality: N/A;   Social History:  reports that he has never smoked. He has never used smokeless tobacco. He reports current alcohol use. He reports that he does not use drugs.  Allergies  Allergen Reactions   Other Other (See Comments)    Adhesive tape causes skin to peel and darken.   Penicillins     Has patient had a PCN reaction causing immediate rash, facial/tongue/throat swelling, SOB or lightheadedness with hypotension:NO Has patient had a PCN reaction causing severe rash  involving mucus membranes or skin necrosis:NO Has patient had a PCN reaction that required hospitalization NO Has patient had a PCN reaction occurring within the last 10 years: NO If all of the above answers are "NO", then may proceed with Cephalosporin use.   Latex Rash    Family History  Problem Relation Age of Onset   Heart disease Mother    Hypertension Other    Melanoma Brother    Heart disease Brother    Colon cancer Neg Hx    Stomach cancer Neg Hx    Rectal cancer Neg Hx    Esophageal cancer Neg Hx    Prostate cancer Neg Hx    Pancreatic cancer Neg Hx     Prior to Admission medications   Medication Sig Start Date End Date Taking? Authorizing Provider  acetaminophen (TYLENOL) 650 MG CR tablet Take 650 mg by mouth 2 (two) times daily.    [provider]  allopurinol (ZYLOPRIM) 100 MG tablet Take 100 mg by mouth daily. 05/17/17   [provider]  amLODipine (NORVASC) 10 MG tablet Take 10 mg by mouth daily. 08/10/18   [provider]  atorvastatin (LIPITOR) 10 MG tablet TAKE 1 TABLET EVERY DAY 01/21/21   Yates Decamp, MD  azelastine (ASTELIN) 0.1 % nasal spray 1-2 sprays each nostril every 12 hours if needed 11/11/19   Jetty Duhamel D, MD  b complex vitamins capsule Take 1 capsule by mouth daily.    [provider]  brimonidine (ALPHAGAN) 0.2 % ophthalmic solution Place 1 drop into both eyes 2 (two) times daily. 01/25/22   [provider]  carvedilol (COREG) 25 MG tablet Take 1 tablet (25 mg total) by mouth 2 (two) times daily with a meal. 01/20/15   Newt Lukes, MD  COVID-19 mRNA vaccine [phone] (COMIRNATY) syringe Inject into the muscle. 03/07/22   Judyann Munson, MD  Ferrous Sulfate (IRON PO) Take 1 tablet by mouth daily.    [provider]  fluticasone (FLONASE) 50 MCG/ACT nasal spray Place 1 spray into both nostrils daily as needed for rhinitis.    [provider]  gabapentin (NEURONTIN) 300 MG capsule Take 300  mg by mouth 2 (two) times daily. 03/22/21   [provider]  losartan-hydrochlorothiazide (HYZAAR) 50-12.5 MG per tablet Take 1 tablet by mouth 2 (two) times daily. 01/20/15   Newt Lukes, MD  Misc Natural Products (COLON CLEANSE PO) Take 1 tablet by mouth daily as needed (Constipation).    [provider]  OVER THE COUNTER MEDICATION Take 3 tablets by mouth daily. Omega XL    [provider]  oxyCODONE (OXY IR/ROXICODONE) 5 MG immediate release tablet Take 1 tablet (5 mg total) by mouth every 6 (six) hours as needed for up to 8 days for moderate pain ((score 4 to 6)). 01/07/23 01/15/23  Cabbell,  Ronaldo Miyamoto, MD  polyethylene glycol (MIRALAX / GLYCOLAX) 17 g packet Take 17 g by mouth daily as needed for mild constipation or moderate constipation.    [provider]  tamsulosin (FLOMAX) 0.4 MG CAPS capsule Take 0.4 mg by mouth every evening. 05/23/19   [provider]  tiZANidine (ZANAFLEX) 4 MG tablet Take 1 tablet (4 mg total) by mouth every 6 (six) hours as needed for muscle spasms. 01/07/23   Coletta Memos, MD    Physical Exam: Vitals:   01/08/23 1349 01/08/23 1413 01/08/23 1430 01/08/23 1600  BP:  (!) 108/58 105/60 108/75  Pulse:  84 84 81  Resp:  20 (!) 22 (!) 21  Temp:  98.2 F (36.8 C)    TempSrc:  Oral    SpO2:  100% 100% 97%  Weight: 117.9 kg     Height: 5\' 10"  (1.778 m)      General:  Appears calm and comfortable and is in NAD Eyes:  PERRL, EOMI, normal lids, iris ENT:  grossly normal hearing, lips & tongue, mmm; appropriate dentition Neck:  no LAD, masses or thyromegaly; no carotid bruits Cardiovascular:  RRR, +systolic murmur. 1+ LE edema.  Respiratory:   CTA bilaterally with no wheezes/rales/rhonchi.  Normal respiratory effort. Abdomen:  soft, NT, ND, NABS Back:   normal alignment, no CVAT Skin:  no rash or induration seen on limited exam Musculoskeletal:  grossly normal tone BUE/BLE, good ROM, no bony abnormality Lower extremity:   Limited foot exam with no ulcerations.  2+ distal pulses. Psychiatric:  grossly normal mood and affect, speech fluent and appropriate, AOx3 Neurologic:  CN 2-12 grossly intact, moves all extremities in coordinated fashion, sensation intact   Radiological Exams on Admission: Independently reviewed - see discussion in A/P where applicable  No results found.  EKG: pending    Labs on Admission: I have personally reviewed the available labs and imaging studies at the time of the admission.  Pertinent labs:    wbc: 38.1,  hgb: 9.7,  platelets: 136,  BUN: 40,  creatinine: 2.30  Assessment and Plan: Principal Problem:   Acute renal failure superimposed on stage 3b chronic kidney disease (HCC) Active Problems:   Macrocytic anemia   CLL (chronic lymphocytic leukemia) (HCC)   Essential hypertension   Spondylolisthesis   Malignant neoplasm of prostate (HCC)   Mixed hyperlipidemia   Obstructive sleep apnea    Assessment and Plan: * Acute renal failure superimposed on stage 3b chronic kidney disease (HCC) 77 year old presenting to ED with one day history of fatigue, intermittent drowsiness, muscle cramping in his legs s/p lumbar decompression and fusion secondary to spondylolisthesis L4/L5 on 01/06/23 by Dr. Franky Macho.  -obs to progressive  -check renal US/urine studies -baseline creatinine around 1.4 -UA pending  -strict I/O -avoid  nephrotoxic drugs and hold his home HTN meds (hyzaar)  -stop tizanidine  -hypotensive when arrived, ? Some hypoperfusion contributing. Hold home bp meds for now. Check orthostatics  -gentle IVF  -trend   Macrocytic anemia Baseline hgb ranges from 10-12 Was 11.1 on 8/23 and now 9.7 No signs/symptoms of bleeding, but fecal occult positive Last scope in 2009, recall 10 years GI consulted by EDP Check iron studies/b12/folate. Leaning more toward dilutional/s/p surgery or CKD  Adding on haptoglobin, retic count, LDH and smear pending to r/o hemolytic  anemia  Hemolytic anemia w/u negative with oncologist in May SPEP was negative for M spike and immunofixation normal  Trend CBC Jehovah witness and doesn't want any blood  products   CLL (chronic lymphocytic leukemia) (HCC) Followed at [facility] oncology with Dr. Neil Crouch  Prognostic markers showed trisomy 12, deletion of q13, and a NOTCH 1 mutation. He has had progressive anemia and unclear if this is related to chronic kidney disease or progressive CLL. Previous w/u unremarkable for hemolytic anemia SPEP was negative for M spike and immunofixation was also normal.  Last seen in 09/2022 and declined BM biopsy at that time    Spondylolisthesis He is s/p lumbar decompression and fusion secondary to spondylolisthesis L4/L5 on 01/06/23 by Dr. Franky Macho.  No bleeding from site D/c on 01/06/22. POD #3 Neurosurgery aware, no recommendations PT to see  Essential hypertension Soft bp on arrival and continue to be soft.  Hold hyzaar with AKI Hold coreg/novasc Check orthostatics   Malignant neoplasm of prostate (HCC) S/p radiation therapy   Mixed hyperlipidemia Continue lipitor 10mg  daily   Obstructive sleep apnea Failed to tolerate cpap and oral appliance      Advance Care Planning:   Code Status: Full Code   Consults: GI/PT   DVT Prophylaxis: SCDs  Family Communication: daughter at bedside   Severity of Illness: The appropriate patient status for this patient is OBSERVATION. Observation status is judged to be reasonable and necessary in order to provide the required intensity of service to ensure the patient's safety. The patient's presenting symptoms, physical exam findings, and initial radiographic and laboratory data in the context of their medical condition is felt to place them at decreased risk for further clinical deterioration. Furthermore, it is anticipated that the patient will be medically stable for discharge from the hospital within 2 midnights of admission.    Author: Orland Mustard, MD 01/08/2023 4:50 PM  For on call review [email or web address].

## 2023-01-08 NOTE — Assessment & Plan Note (Addendum)
Soft bp on arrival and continue to be soft.  Hold hyzaar with AKI Hold coreg/novasc Check orthostatics

## 2023-01-08 NOTE — ED Notes (Signed)
ED TO INPATIENT HANDOFF REPORT  ED Nurse Name and Phone #: [phone]  S Name/Age/Gender Eugene [name] 77 y.o. male Room/Bed: 018C/018C  Code Status   Code Status: Prior  Home/SNF/Other Home Patient oriented to: self, place, time, and situation Is this baseline? Yes   Triage Complete: Triage complete  Chief Complaint Acute renal failure superimposed on stage 3b chronic kidney disease (HCC) [N17.9, N18.32]  Triage Note Pt c/o bilateral leg cramping, difficulty urinating, and increased fatigue after taking newly prescribed medications x2 days.  Pain score 3/10.  Pt has a lumbar fusion x2 days ago.    Pt newly prescribed oxycodone and tizanidine.  Pt reports he quickly falls asleep and is hard to wake after taking medications.       Allergies Allergies  Allergen Reactions   Other Other (See Comments)    Adhesive tape causes skin to peel and darken.   Penicillins     Has patient had a PCN reaction causing immediate rash, facial/tongue/throat swelling, SOB or lightheadedness with hypotension:NO Has patient had a PCN reaction causing severe rash involving mucus membranes or skin necrosis:NO Has patient had a PCN reaction that required hospitalization NO Has patient had a PCN reaction occurring within the last 10 years: NO If all of the above answers are "NO", then may proceed with Cephalosporin use.   Latex Rash    Level of Care/Admitting Diagnosis ED Disposition     ED Disposition  Admit   Condition  --   Comment  Hospital Area: [facility] [[id number]]  Level of Care: Med-Surg [16]  May place patient in observation at [facility] or [facility] if equivalent level of care is available:: No  Covid Evaluation: Asymptomatic - no recent exposure (last 10 days) testing not required  Diagnosis: Acute renal failure superimposed on stage 3b chronic kidney disease [facility]) [[id number]]  Admitting Physician: Orland Mustard [[id number]]  Attending Physician: Orland Mustard [[id number]]          B Medical/Surgery History Past Medical History:  Diagnosis Date   Allergic rhinitis due to pollen    Allergy    seasonal   Anxiety    Blood transfusion without reported diagnosis    do not except blood transfusions.   Cataract    beginning stage   CLL (chronic lymphocytic leukemia) (HCC)    DDD (degenerative disc disease), cervical    Severe w/ osteophytes on xray 3/16   Difficult intubation    ED (erectile dysfunction)    GERD (gastroesophageal reflux disease)    Heart murmur    History of Holter monitoring    started on march 19,2020   Hypertension    OBESITY    Osteoarthritis    Prostate cancer (HCC)    Pulmonary stenosis, valvar    TEE 07/2011: stable, LVEF 55%, small PFO   Refusal of blood transfusions as patient is Jehovah's Witness    Rheumatic fever    residual pulm stenosis, follows with ganji every 68mo   RHINITIS, CHRONIC    SLEEP APNEA, OBSTRUCTIVE dx 2000   NPSG 2000:  AHI 76/hr CPAP titrated to 11cm 2002    Past Surgical History:  Procedure Laterality Date   BACK SURGERY  2021   COLONOSCOPY     PROSTATE BIOPSY     ROTATOR CUFF REPAIR Right    2018   TEE WITHOUT CARDIOVERSION  08/02/2011   Procedure: TRANSESOPHAGEAL ECHOCARDIOGRAM (TEE);  Surgeon: Pamella Pert, MD;  Location: [facility] ENDOSCOPY;  Service: Cardiovascular;  Laterality: N/A;     A IV Location/Drains/Wounds Patient Lines/Drains/Airways Status     Active Line/Drains/Airways     Name Placement date Placement time Site Days   Peripheral IV 01/08/23 18 G Right Antecubital 01/08/23  1438  Antecubital  less than 1            Intake/Output Last 24 hours No intake or output data in the 24 hours ending 01/08/23 1552  Labs/Imaging Results for orders placed or performed during the hospital encounter of 01/08/23 (from the past 48 hour(s))  CBC with Differential     Status: Abnormal   Collection Time: 01/08/23  1:58 PM  Result Value Ref Range   WBC 38.1  (H) 4.0 - 10.5 K/uL   RBC 2.95 (L) 4.22 - 5.81 MIL/uL   Hemoglobin 9.7 (L) 13.0 - 17.0 g/dL   HCT 78.2 (L) 95.6 - 21.3 %   MCV 102.0 (H) 80.0 - 100.0 fL   MCH 32.9 26.0 - 34.0 pg   MCHC 32.2 30.0 - 36.0 g/dL   RDW 08.6 57.8 - 46.9 %   Platelets 136 (L) 150 - 400 K/uL    Comment: SPECIMEN CHECKED FOR CLOTS REPEATED TO VERIFY PLATELET COUNT CONFIRMED BY SMEAR    nRBC 0.0 0.0 - 0.2 %   Neutrophils Relative % 21 %   Neutro Abs 8.0 (H) 1.7 - 7.7 K/uL   Lymphocytes Relative 50 %   Lymphs Abs 18.9 (H) 0.7 - 4.0 K/uL   Monocytes Relative 29 %   Monocytes Absolute 11.0 (H) 0.1 - 1.0 K/uL   Eosinophils Relative 0 %   Eosinophils Absolute 0.0 0.0 - 0.5 K/uL   Basophils Relative 0 %   Basophils Absolute 0.1 0.0 - 0.1 K/uL   WBC Morphology MORPHOLOGY UNREMARKABLE    RBC Morphology MORPHOLOGY UNREMARKABLE    Smear Review PLATELETS APPEAR DECREASED    Immature Granulocytes 0 %   Abs Immature Granulocytes 0.13 (H) 0.00 - 0.07 K/uL    Comment: Performed at [facility], [address], [place], [place] [address]  Basic metabolic panel     Status: Abnormal   Collection Time: 01/08/23  1:58 PM  Result Value Ref Range   Sodium 139 135 - 145 mmol/L   Potassium 3.8 3.5 - 5.1 mmol/L   Chloride 106 98 - 111 mmol/L   CO2 23 22 - 32 mmol/L   Glucose, Bld 94 70 - 99 mg/dL    Comment: Glucose reference range applies only to samples taken after fasting for at least 8 hours.   BUN 40 (H) 8 - 23 mg/dL   Creatinine, Ser 8.41 (H) 0.61 - 1.24 mg/dL   Calcium 8.3 (L) 8.9 - 10.3 mg/dL   GFR, Estimated 29 (L) >60 mL/min    Comment: (NOTE) Calculated using the CKD-EPI Creatinine Equation (2021)    Anion gap 10 5 - 15    Comment: Performed at [facility], [address], [place], [place] [address]  POC occult blood, ED     Status: Abnormal   Collection Time: 01/08/23  3:30 PM  Result Value Ref Range   Fecal Occult Bld POSITIVE (A) NEGATIVE   DG Lumbar Spine 2-3 Views  Result Date:  01/06/2023 CLINICAL DATA:  Elective surgery. EXAM: LUMBAR SPINE - 2-3 VIEW COMPARISON:  None Available. FINDINGS: Nine fluoroscopic spot views of the lumbar spine obtained in the operating room. Pedicle screws at L4 and L5 with interbody spacer. Fluoroscopy time 89 seconds. Dose CT  8.68 mGy. IMPRESSION: Intraoperative fluoroscopy during L4-5 fusion. Electronically Signed   By: Narda Rutherford M.D.   On: 01/06/2023 19:29   DG C-Arm 1-60 Min-No Report  Result Date: 01/06/2023 Fluoroscopy was utilized by the requesting physician.  No radiographic interpretation.   DG C-Arm 1-60 Min-No Report  Result Date: 01/06/2023 Fluoroscopy was utilized by the requesting physician.  No radiographic interpretation.    Pending Labs Unresulted Labs (From admission, onward)     Start     Ordered   01/08/23 1539  Ferritin  Once,   R        01/08/23 1538   01/08/23 1539  Iron and TIBC  Once,   R        01/08/23 1538   01/08/23 1539  Vitamin B12  Once,   R        01/08/23 1538   01/08/23 1539  Folate  Once,   R        01/08/23 1538   01/08/23 1534  Creatinine, urine, random  Once,   URGENT        01/08/23 1534   01/08/23 1534  Sodium, urine, random  Once,   URGENT        01/08/23 1534   01/08/23 1520  Urinalysis, Routine w reflex microscopic -Urine, Clean Catch  Once,   URGENT       Question:  Specimen Source  Answer:  Urine, Clean Catch   01/08/23 1519   01/08/23 1358  Pathologist smear review  Once,   STAT        01/08/23 1358            Vitals/Pain Today's Vitals   01/08/23 1349 01/08/23 1413 01/08/23 1430 01/08/23 1439  BP:  (!) 108/58 105/60   Pulse:  84 84   Resp:  20 (!) 22   Temp:  98.2 F (36.8 C)    TempSrc:  Oral    SpO2:  100% 100%   Weight: 260 lb (117.9 kg)     Height: 5\' 10"  (1.778 m)     PainSc: 3    4     Isolation Precautions No active isolations  Medications Medications  sodium chloride 0.9 % bolus 500 mL (500 mLs Intravenous New Bag/Given 01/08/23 1439)   acetaminophen (TYLENOL) tablet 1,000 mg (1,000 mg Oral Given 01/08/23 1501)    Mobility manual wheelchair     Focused Assessments Neuro Assessment Handoff:  Swallow screen pass? Yes          Neuro Assessment: Within Defined Limits Neuro Checks:      Has TPA been given? No If patient is a Neuro Trauma and patient is going to OR before floor call report to 4N Charge nurse: [phone] or [phone]   R Recommendations: See Admitting Provider Note  Report given to:   Additional Notes: Family at bedside.

## 2023-01-08 NOTE — ED Provider Notes (Addendum)
[place] EMERGENCY DEPARTMENT AT [facility] Provider Note   CSN: [id number] Arrival date & time: 01/08/23  1310     History  Chief Complaint  Patient presents with   Post-op Problem   Leg cramping   Fatigue    Eugene [name] is a 77 y.o. male with medical history of CLL, prostate cancer, GERD, hypertension, spinal stenosis of lumbar region at multiple levels resulting lumbar fusion surgery on 8/23.  Patient also has history of spondylolisthesis at L4-L5.  Patient presents to ED for evaluation of postop issue.  Patient states that on 8/23, 2 days ago, he had lumbar spinal fusion surgery with Dr. Franky Macho of neurosurgery.  The patient reports that he was discharged home same day.  He states that beginning last night he developed fatigue, increased back pain.  He states that he has taken 2 of his prescribed roxicodone pain medication pills upon discharge, has taken 2 tizanidine muscle relaxers.  Patient also taking gabapentin.  He states he last urinated this morning without issue.  He denies any fevers, groin numbness, lower extremity weakness.  He does endorse 1 episode of urinary incontinence but states that it was because he could not get up and get to the bathroom in time secondary to his pain from surgery.  He denies any abdominal pain, nausea, vomiting, diarrhea, chest pain, shortness of breath.  He denies any one-sided weakness or numbness, body aches or chills.  Patient reports that at 1 point he fell asleep while talking to his daughter and this concerned him.  HPI     Home Medications Prior to Admission medications   Medication Sig Start Date End Date Taking? Authorizing Provider  acetaminophen (TYLENOL) 650 MG CR tablet Take 650 mg by mouth 2 (two) times daily.    [provider]  allopurinol (ZYLOPRIM) 100 MG tablet Take 100 mg by mouth daily. 05/17/17   [provider]  amLODipine (NORVASC) 10 MG tablet Take 10 mg by mouth daily. 08/10/18   [provider]  atorvastatin (LIPITOR) 10 MG tablet TAKE 1 TABLET EVERY DAY 01/21/21   Yates Decamp, MD  azelastine (ASTELIN) 0.1 % nasal spray 1-2 sprays each nostril every 12 hours if needed 11/11/19   Jetty Duhamel D, MD  b complex vitamins capsule Take 1 capsule by mouth daily.    [provider]  brimonidine (ALPHAGAN) 0.2 % ophthalmic solution Place 1 drop into both eyes 2 (two) times daily. 01/25/22   [provider]  carvedilol (COREG) 25 MG tablet Take 1 tablet (25 mg total) by mouth 2 (two) times daily with a meal. 01/20/15   Newt Lukes, MD  COVID-19 mRNA vaccine [phone] (COMIRNATY) syringe Inject into the muscle. 03/07/22   Judyann Munson, MD  Ferrous Sulfate (IRON PO) Take 1 tablet by mouth daily.    [provider]  fluticasone (FLONASE) 50 MCG/ACT nasal spray Place 1 spray into both nostrils daily as needed for rhinitis.    [provider]  gabapentin (NEURONTIN) 300 MG capsule Take 300 mg by mouth 2 (two) times daily. 03/22/21   [provider]  losartan-hydrochlorothiazide (HYZAAR) 50-12.5 MG per tablet Take 1 tablet by mouth 2 (two) times daily. 01/20/15   Newt Lukes, MD  Misc Natural Products (COLON CLEANSE PO) Take 1 tablet by mouth daily as needed (Constipation).    [provider]  OVER THE COUNTER MEDICATION Take 3 tablets by mouth daily. Omega XL    [provider]  oxyCODONE (OXY IR/ROXICODONE) 5 MG immediate release tablet Take 1 tablet (5 mg total) by mouth every 6 (six) hours as needed for up to 8 days for moderate pain ((score 4 to 6)). 01/07/23 01/15/23  Coletta Memos, MD  polyethylene glycol (MIRALAX / GLYCOLAX) 17 g packet Take 17 g by mouth daily as needed for mild constipation or moderate constipation.    [provider]  tamsulosin (FLOMAX) 0.4 MG CAPS capsule Take 0.4 mg by mouth every evening. 05/23/19   [provider]  tiZANidine (ZANAFLEX) 4 MG tablet Take 1 tablet (4 mg  total) by mouth every 6 (six) hours as needed for muscle spasms. 01/07/23   Coletta Memos, MD      Allergies    Other, Penicillins, and Latex    Review of Systems   Review of Systems  Constitutional:  Positive for fatigue.  Respiratory:  Negative for shortness of breath.   Cardiovascular:  Negative for chest pain.  Gastrointestinal:  Positive for constipation. Negative for abdominal pain, nausea and vomiting.  Genitourinary:  Negative for dysuria.  Neurological:  Positive for weakness. Negative for speech difficulty and headaches.  All other systems reviewed and are negative.   Physical Exam Updated Vital Signs BP 105/60   Pulse 84   Temp 98.2 F (36.8 C) (Oral)   Resp (!) 22   Ht 5\' 10"  (1.778 m)   Wt 117.9 kg   SpO2 100%   BMI 37.31 kg/m  Physical Exam Vitals and nursing note reviewed.  Constitutional:      General: He is not in acute distress.    Appearance: Normal appearance. He is not ill-appearing, toxic-appearing or diaphoretic.  HENT:     Head: Normocephalic and atraumatic.     Nose: Nose normal.     Mouth/Throat:     Mouth: Mucous membranes are moist.     Pharynx: Oropharynx is clear.  Eyes:     Extraocular Movements: Extraocular movements intact.     Conjunctiva/sclera: Conjunctivae normal.     Pupils: Pupils are equal, round, and reactive to light.  Cardiovascular:     Rate and Rhythm: Normal rate and regular rhythm.  Pulmonary:     Effort: Pulmonary effort is normal. No respiratory distress.     Breath sounds: Normal breath sounds. No wheezing.  Abdominal:     General: Abdomen is flat. Bowel sounds are normal.     Palpations: Abdomen is soft.     Tenderness: There is no abdominal tenderness.  Musculoskeletal:     Cervical back: Normal range of motion and neck supple. No tenderness.     Comments: Surgical incision present.  Clean dry and intact, covered with dressing.  Skin:    General: Skin is warm and dry.     Capillary Refill: Capillary refill  takes less than 2 seconds.  Neurological:     General: No focal deficit present.     Mental Status: He is alert and oriented to person, place, and time.     GCS: GCS eye subscore is 4. GCS verbal subscore is 5. GCS motor subscore is 6.     Cranial Nerves: Cranial nerves 2-12 are intact. No cranial nerve deficit.     Sensory: Sensation is intact. No sensory deficit.     Motor: Motor function is intact. No weakness.     Comments: 5 out of 5 strength bilateral lower extremities     ED Results / Procedures / Treatments   Labs (all labs ordered are listed,  but only abnormal results are displayed) Labs Reviewed  CBC WITH DIFFERENTIAL/PLATELET - Abnormal; Notable for the following components:      Result Value   WBC 38.1 (*)    RBC 2.95 (*)    Hemoglobin 9.7 (*)    HCT 30.1 (*)    MCV 102.0 (*)    Platelets 136 (*)    Neutro Abs 8.0 (*)    Lymphs Abs 18.9 (*)    Monocytes Absolute 11.0 (*)    Abs Immature Granulocytes 0.13 (*)    All other components within normal limits  BASIC METABOLIC PANEL - Abnormal; Notable for the following components:   BUN 40 (*)    Creatinine, Ser 2.30 (*)    Calcium 8.3 (*)    GFR, Estimated 29 (*)    All other components within normal limits  PATHOLOGIST SMEAR REVIEW  URINALYSIS, ROUTINE W REFLEX MICROSCOPIC  CREATININE, URINE, RANDOM  SODIUM, URINE, RANDOM  FERRITIN  IRON AND TIBC  VITAMIN B12  FOLATE  POC OCCULT BLOOD, ED    EKG None  Radiology DG Lumbar Spine 2-3 Views  Result Date: 01/06/2023 CLINICAL DATA:  Elective surgery. EXAM: LUMBAR SPINE - 2-3 VIEW COMPARISON:  None Available. FINDINGS: Nine fluoroscopic spot views of the lumbar spine obtained in the operating room. Pedicle screws at L4 and L5 with interbody spacer. Fluoroscopy time 89 seconds. Dose CT 8.68 mGy. IMPRESSION: Intraoperative fluoroscopy during L4-5 fusion. Electronically Signed   By: Narda Rutherford M.D.   On: 01/06/2023 19:29   DG C-Arm 1-60 Min-No Report  Result  Date: 01/06/2023 Fluoroscopy was utilized by the requesting physician.  No radiographic interpretation.   DG C-Arm 1-60 Min-No Report  Result Date: 01/06/2023 Fluoroscopy was utilized by the requesting physician.  No radiographic interpretation.    Procedures Procedures   Medications Ordered in ED Medications  sodium chloride 0.9 % bolus 500 mL (500 mLs Intravenous New Bag/Given 01/08/23 1439)  acetaminophen (TYLENOL) tablet 1,000 mg (1,000 mg Oral Given 01/08/23 1501)    ED Course/ Medical Decision Making/ A&P  Medical Decision Making Amount and/or Complexity of Data Reviewed Labs: ordered.  Risk OTC drugs. Decision regarding hospitalization.    77 year old male presents to the ED for evaluation.  Please see HPI for further details.  On examination the patient is afebrile, nontachycardic.  Lung sounds are clear bilaterally and he is not hypoxic.  Abdomen is soft and compressible throughout.  Neurological examination shows 5 out of 5 strength bilateral lower extremities.  His surgical incision is present on his midline lumbar spine, it is clean dry and intact, it is covered with a dressing.  There is no strikethrough.  Patient blood pressure low on arrival.  Will provide patient 500 mL of fluid.  Will collect CBC, BMP.  Patient CBC shows leukocytosis of 30.1 however patient does have CLL.  Patient hemoglobin has dropped.  2 days ago hemoglobin 11.1, today 9.7.  Patient is practicing Jehovah's Witness, does defer transfusion.  Denies any blood in stool, denies taking blood thinning medications.  Patient metabolic panel shows AKI.  Patient creatinine was 1.372 days ago, today 2.3.  Anion gap 10, no electrolyte derangement.  Patient AKI and resulting anemia could be linked.  Will collect point-of-care occult blood card to ensure no blood in stool.  Have discussed patient with Dr. Franky Macho, patient neurosurgeon.  Have discussed patient case with hospitalist, Dr. Artis Flock, who has  agreed to admit the patient.  Patient Hemoccult card is positive.  Will consult  gastroenterology to get them to follow along.  Have made Dr. Artis Flock aware.  Discussed with Carmel Sacramento, PA-C with [facility] who has agreed to consult on patient while admitted.  Patient will require admission for AKI.  Patient will be signed out to oncoming provider Dr. Kennon Holter.  Plan of care discussed with oncoming provider.   Final Clinical Impression(s) / ED Diagnoses Final diagnoses:  Weakness  Acute kidney injury (HCC)  Anemia, unspecified type  Gastrointestinal hemorrhage, unspecified gastrointestinal hemorrhage type    Rx / DC Orders ED Discharge Orders     None           Clent Ridges 01/08/23 1542    Jacalyn Lefevre, MD 01/08/23 1546

## 2023-01-08 NOTE — Assessment & Plan Note (Signed)
Failed to tolerate cpap and oral appliance

## 2023-01-09 ENCOUNTER — Other Ambulatory Visit: Payer: Self-pay

## 2023-01-09 DIAGNOSIS — K219 Gastro-esophageal reflux disease without esophagitis: Secondary | ICD-10-CM | POA: Diagnosis present

## 2023-01-09 DIAGNOSIS — R531 Weakness: Secondary | ICD-10-CM | POA: Diagnosis present

## 2023-01-09 DIAGNOSIS — C9110 Chronic lymphocytic leukemia of B-cell type not having achieved remission: Secondary | ICD-10-CM | POA: Diagnosis present

## 2023-01-09 DIAGNOSIS — I129 Hypertensive chronic kidney disease with stage 1 through stage 4 chronic kidney disease, or unspecified chronic kidney disease: Secondary | ICD-10-CM | POA: Diagnosis present

## 2023-01-09 DIAGNOSIS — R12 Heartburn: Secondary | ICD-10-CM | POA: Diagnosis present

## 2023-01-09 DIAGNOSIS — N1832 Chronic kidney disease, stage 3b: Secondary | ICD-10-CM | POA: Diagnosis present

## 2023-01-09 DIAGNOSIS — D509 Iron deficiency anemia, unspecified: Secondary | ICD-10-CM

## 2023-01-09 DIAGNOSIS — D539 Nutritional anemia, unspecified: Secondary | ICD-10-CM | POA: Diagnosis present

## 2023-01-09 DIAGNOSIS — G4733 Obstructive sleep apnea (adult) (pediatric): Secondary | ICD-10-CM | POA: Diagnosis present

## 2023-01-09 DIAGNOSIS — M4316 Spondylolisthesis, lumbar region: Secondary | ICD-10-CM | POA: Diagnosis present

## 2023-01-09 DIAGNOSIS — Z79899 Other long term (current) drug therapy: Secondary | ICD-10-CM | POA: Diagnosis not present

## 2023-01-09 DIAGNOSIS — E782 Mixed hyperlipidemia: Secondary | ICD-10-CM | POA: Diagnosis present

## 2023-01-09 DIAGNOSIS — R195 Other fecal abnormalities: Secondary | ICD-10-CM | POA: Diagnosis present

## 2023-01-09 DIAGNOSIS — Z8546 Personal history of malignant neoplasm of prostate: Secondary | ICD-10-CM | POA: Diagnosis not present

## 2023-01-09 DIAGNOSIS — Z531 Procedure and treatment not carried out because of patient's decision for reasons of belief and group pressure: Secondary | ICD-10-CM | POA: Diagnosis present

## 2023-01-09 DIAGNOSIS — Z923 Personal history of irradiation: Secondary | ICD-10-CM | POA: Diagnosis not present

## 2023-01-09 DIAGNOSIS — K922 Gastrointestinal hemorrhage, unspecified: Secondary | ICD-10-CM | POA: Diagnosis present

## 2023-01-09 DIAGNOSIS — M48062 Spinal stenosis, lumbar region with neurogenic claudication: Secondary | ICD-10-CM | POA: Diagnosis present

## 2023-01-09 DIAGNOSIS — D638 Anemia in other chronic diseases classified elsewhere: Secondary | ICD-10-CM

## 2023-01-09 DIAGNOSIS — N179 Acute kidney failure, unspecified: Secondary | ICD-10-CM | POA: Diagnosis present

## 2023-01-09 DIAGNOSIS — Z8249 Family history of ischemic heart disease and other diseases of the circulatory system: Secondary | ICD-10-CM | POA: Diagnosis not present

## 2023-01-09 DIAGNOSIS — E860 Dehydration: Secondary | ICD-10-CM | POA: Diagnosis present

## 2023-01-09 DIAGNOSIS — Q928 Other specified trisomies and partial trisomies of autosomes: Secondary | ICD-10-CM | POA: Diagnosis not present

## 2023-01-09 DIAGNOSIS — M5030 Other cervical disc degeneration, unspecified cervical region: Secondary | ICD-10-CM | POA: Diagnosis present

## 2023-01-09 DIAGNOSIS — J310 Chronic rhinitis: Secondary | ICD-10-CM | POA: Diagnosis present

## 2023-01-09 LAB — BASIC METABOLIC PANEL
Anion gap: 8 (ref 5–15)
BUN: 33 mg/dL — ABNORMAL HIGH (ref 8–23)
CO2: 20 mmol/L — ABNORMAL LOW (ref 22–32)
Calcium: 7.7 mg/dL — ABNORMAL LOW (ref 8.9–10.3)
Chloride: 107 mmol/L (ref 98–111)
Creatinine, Ser: 1.67 mg/dL — ABNORMAL HIGH (ref 0.61–1.24)
GFR, Estimated: 42 mL/min — ABNORMAL LOW (ref >60)
Glucose, Bld: 109 mg/dL — ABNORMAL HIGH (ref 70–99)
Potassium: 3.6 mmol/L (ref 3.5–5.1)
Sodium: 135 mmol/L (ref 135–145)

## 2023-01-09 LAB — CBC
HCT: 26.9 % — ABNORMAL LOW (ref 39.0–52.0)
HCT: 29.8 % — ABNORMAL LOW (ref 39.0–52.0)
Hemoglobin: 8.7 g/dL — ABNORMAL LOW (ref 13.0–17.0)
Hemoglobin: 9.7 g/dL — ABNORMAL LOW (ref 13.0–17.0)
MCH: 32.4 pg (ref 26.0–34.0)
MCH: 32.8 pg (ref 26.0–34.0)
MCHC: 32.3 g/dL (ref 30.0–36.0)
MCHC: 32.6 g/dL (ref 30.0–36.0)
MCV: 101.5 fL — ABNORMAL HIGH (ref 80.0–100.0)
MCV: 99.7 fL (ref 80.0–100.0)
Platelets: 116 10*3/uL — ABNORMAL LOW (ref 150–400)
Platelets: 130 10*3/uL — ABNORMAL LOW (ref 150–400)
RBC: 2.65 MIL/uL — ABNORMAL LOW (ref 4.22–5.81)
RBC: 2.99 MIL/uL — ABNORMAL LOW (ref 4.22–5.81)
RDW: 12.9 % (ref 11.5–15.5)
RDW: 13.1 % (ref 11.5–15.5)
WBC: 35.2 10*3/uL — ABNORMAL HIGH (ref 4.0–10.5)
WBC: 42.7 10*3/uL — ABNORMAL HIGH (ref 4.0–10.5)
nRBC: 0 % (ref 0.0–0.2)
nRBC: 0 % (ref 0.0–0.2)

## 2023-01-09 LAB — DIC (DISSEMINATED INTRAVASCULAR COAGULATION)PANEL
D-Dimer, Quant: 2.21 ug{FEU}/mL — ABNORMAL HIGH (ref 0.00–0.50)
Fibrinogen: 714 mg/dL — ABNORMAL HIGH (ref 210–475)
INR: 1.3 — ABNORMAL HIGH (ref 0.8–1.2)
Platelets: 123 10*3/uL — ABNORMAL LOW (ref 150–400)
Prothrombin Time: 16.1 seconds — ABNORMAL HIGH (ref 11.4–15.2)
Smear Review: NONE SEEN
aPTT: 35 seconds (ref 24–36)

## 2023-01-09 LAB — PATHOLOGIST SMEAR REVIEW

## 2023-01-09 MED ORDER — ALUM & MAG HYDROXIDE-SIMETH 200-200-20 MG/5ML PO SUSP
30.0000 mL | ORAL | Status: DC | PRN
  Administered 2023-01-09: 30 mL via ORAL
  Filled 2023-01-09: qty 30

## 2023-01-09 MED ORDER — FOLIC ACID 1 MG PO TABS
1.0000 mg | ORAL_TABLET | Freq: Every day | ORAL | Status: DC
  Administered 2023-01-09 – 2023-01-11 (×3): 1 mg via ORAL
  Filled 2023-01-09 (×3): qty 1

## 2023-01-09 MED ORDER — OXYCODONE HCL 5 MG PO TABS: 5.0000 mg | ORAL_TABLET | Freq: Three times a day (TID) | ORAL | Status: DC | PRN

## 2023-01-09 MED ORDER — TRAMADOL HCL 50 MG PO TABS: 50.0000 mg | ORAL_TABLET | Freq: Four times a day (QID) | ORAL | Status: DC | PRN

## 2023-01-09 MED ORDER — FERROUS SULFATE 325 (65 FE) MG PO TABS
325.0000 mg | ORAL_TABLET | Freq: Two times a day (BID) | ORAL | Status: DC
  Administered 2023-01-09 – 2023-01-11 (×5): 325 mg via ORAL
  Filled 2023-01-09 (×5): qty 1

## 2023-01-09 MED ORDER — IRON SUCROSE 500 MG IVPB - SIMPLE MED
500.0000 mg | INTRAVENOUS | Status: DC
  Administered 2023-01-10: 500 mg via INTRAVENOUS
  Filled 2023-01-09 (×2): qty 275

## 2023-01-09 MED ORDER — LACTATED RINGERS IV SOLN: INTRAVENOUS | Status: AC

## 2023-01-09 MED ORDER — IRON SUCROSE 500 MG IVPB - SIMPLE MED
500.0000 mg | Freq: Once | INTRAVENOUS | Status: AC
  Administered 2023-01-09: 500 mg via INTRAVENOUS
  Filled 2023-01-09: qty 275

## 2023-01-09 NOTE — Progress Notes (Signed)
[facility] ROUNDING NOTE   Subjective: No acute events overnight.  No overt bleeding.  H/H last evening was largely stable.  Repeat CBC this morning not yet resulted.  BMP with improved renal function.   Objective: Vital signs in last 24 hours: Temp:  [98 F (36.7 C)-100 F (37.8 C)] 99.6 F (37.6 C) (08/26 0750) Pulse Rate:  [79-100] 88 (08/26 0750) Resp:  [18-26] 26 (08/26 0750) BP: (91-115)/(45-75) 114/56 (08/26 0750) SpO2:  [91 %-100 %] 95 % (08/26 0750) FiO2 (%):  [96 %] 96 % (08/26 0750) Weight:  [117.9 kg] 117.9 kg (08/25 1349)   General: NAD Abdomen: Soft, NT, ND, +BS Ext:  No c/c/e    Intake/Output from previous day: 08/25 0701 - 08/26 0700 In: 1257.8 [I.V.:757.8; IV Piggyback:500] Out: -  Intake/Output this shift: Total I/O In: 240 [P.O.:240] Out: -    Lab Results: Recent Labs    01/08/23 1358 01/08/23 1633 01/08/23 2334 01/09/23 0835  WBC 38.1* 36.3* 35.2*  --   HGB 9.7* 9.0* 8.7*  --   PLT 136* 118* 116* 123*  MCV 102.0* 105.3* 101.5*  --    BMET Recent Labs    01/06/23 2033 01/08/23 1358 01/09/23 0350  NA  --  139 135  K  --  3.8 3.6  CL  --  106 107  CO2  --  23 20*  GLUCOSE  --  94 109*  BUN  --  40* 33*  CREATININE 1.37* 2.30* 1.67*  CALCIUM  --  8.3* 7.7*   LFT No results for input(s): "PROT", "ALBUMIN", "AST", "ALT", "ALKPHOS", "BILITOT", "BILIDIR", "IBILI" in the last 72 hours. PT/INR Recent Labs    01/09/23 0835  INR PENDING      Imaging/Other results: US RENAL  Result Date: 01/08/2023 CLINICAL DATA:  Acute renal failure. EXAM: RENAL / URINARY TRACT ULTRASOUND COMPLETE COMPARISON:  CT 07/17/2019 FINDINGS: Right Kidney: Renal measurements: 12.9 x 5.1 x 6.7 cm = volume: 229 mL. No hydronephrosis. Mild thinning of the renal parenchyma with lobular contours. Increased renal parenchymal echogenicity. The punctate stones on prior CT are not seen by ultrasound. No focal abnormality. Left Kidney: Renal  measurements: 11.7 x 6.1 x 5.5 cm = volume: 204 mL. No hydronephrosis. Mild renal parenchymal thinning and increased echogenicity. The punctate renal stones on prior CT are not seen by ultrasound. Bladder: Only minimally distended. Appears normal for degree of bladder distention. Other: None. IMPRESSION: 1. No obstructive uropathy. 2. Thinning of the renal parenchyma with increased parenchymal echogenicity suggestive of chronic medical renal disease. 3. The punctate intrarenal stones on prior CT are not seen by ultrasound. Electronically Signed   By: Narda Rutherford M.D.   On: 01/08/2023 22:30      Assessment and Plan:  1) Acute on chronic anemia 2) Iron deficiency anemia 77 year old male with history of CLL, recent spine surgery, presents with acute on chronic anemia.  Baseline Hgb ~11-12.  Was 8.7 yesterday and FOBT positive.  No overt bleeding.  Iron panel shows normal ferritin was otherwise reduced iron indices.  Suspect falsely normal ferritin as an acute phase reactant, but underlying iron deficiency.  Otherwise normal B12/folate.  Discussed DDx for acute on chronic anemia with the patient at length today.  No overt bleeding and otherwise HD stable.  Do not feel that he needs inpatient endoscopic evaluation.  Additionally, would likely be difficult to tolerate bowel prep given recent spine surgery.  Patient is Jehovah's Witness and declines blood products; is willing to  have IV iron.  Plan for the following:  - IV iron now as inpatient - Trend CBC while inpatient - Repeat CBC check 7-10 days after hospital discharge to ensure returning towards baseline - Plan for outpatient EGD/colonoscopy when recovered from surgery  3) GERD - Continue pantoprazole 40 mg daily  4) AKI - Renal function improving - Management per primary Hospital service  5) CLL - Follow-up with heme-onc as outpatient  Inpatient GI service will sign off at this time.  Please do not hesitate to contact us with  additional questions or concerns.  Will coordinate outpatient follow-up in the GI clinic in the interim.    Shellia Cleverly, DO  01/09/2023, 9:54 AM [facility] Pager [phone]

## 2023-01-09 NOTE — TOC Progression Note (Signed)
Transition of Care [facility]) - Progression Note    Patient Details  Name: Karel Stege MRN: [id number] Date of Birth: [birth year]  [facility] CM/SW Contact  Gordy Clement, RN Phone Number: 01/09/2023, 3:57 PM  Clinical Narrative:     Home Health has been recommended- [facility] has accepted the referral. Rolling walker to be delivered by [employer]  AVS updated         Expected Discharge Plan and Services                                               Social Determinants of Health (SDOH) Interventions SDOH Screenings   Food Insecurity: No Food Insecurity (01/06/2023)  Housing: Low Risk  (01/06/2023)  Transportation Needs: No Transportation Needs (01/06/2023)  Utilities: Not At Risk (01/06/2023)  Financial Resource Strain: Medium Risk (06/14/2022)   Received from [facility], [facility]  Social Connections: Unknown (01/14/2022)   Received from [facility], [facility]  Tobacco Use: Low Risk  (01/08/2023)    Readmission Risk Interventions     No data to display

## 2023-01-09 NOTE — Evaluation (Signed)
Physical Therapy Evaluation Patient Details Name: Eugene [name] MRN: [id number] DOB: 1945/12/19 Today's Date: 01/09/2023  History of Present Illness  Pt is a 77 y/o M admitted on 01/08/23 after presenting to the ED with c/o difficulty urinating, leg cramping & increased fatigue x 2 days. Pt is s/p lumbar decompression & fusion 2/2 spondylolisthesis L4/L5 on 01/06/23. 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 PMH: CLL, GERD, HTN, prostate CA, rheumatic fever with residual pulmonary stenosis, OSA on CPAP, anxiety, cervical DDD, heart murmur, obesity  Clinical Impression  Pt seen for PT evaluation with pt agreeable to tx. Pt is only able to recall 2/3 back precautions with PT educating him on 3/3. Pt requires cuing for log rolling technique & min assist to complete. Pt is able to transfer STS from various surfaces with cuing for hand placement, CGA<>min assist with RW. Pt ambulates in room & hallway with RW & CGA with forward trunk flexion as pt notes increased back pain with upright posture. Pt with c/o "pulsing" sensation in face intermittently during session - MD & nurse notified. Pt would benefit from ongoing PT services to address strengthening, balance, activity tolerance, & gait with LRAD. PT educated pt on importance of ongoing mobility.  BP checked in LUE: Sitting EOB with c/o feeling "fuzzy": 123/68 mmHg MAP 84 Sitting in recliner after walking around bed: 164/63 mmHg MAP 78 After resting in chair: 117/65 mmHg MAP 79      If plan is discharge home, recommend the following: A little help with walking and/or transfers;A little help with bathing/dressing/bathroom;Assistance with cooking/housework;Assist for transportation;Help with stairs or ramp for entrance   Can travel by private vehicle        Equipment Recommendations Rolling walker (2 wheels)  Recommendations for Other Services       Functional Status Assessment Patient has had a recent decline in  their functional status and demonstrates the ability to make significant improvements in function in a reasonable and predictable amount of time.     Precautions / Restrictions Precautions Precautions: Fall;Back Restrictions Weight Bearing Restrictions: No      Mobility  Bed Mobility Overal bed mobility: Needs Assistance Bed Mobility: Rolling, Sidelying to Sit Rolling: Supervision, Used rails Sidelying to sit: Min assist, HOB elevated, Used rails (assistance to upright trunk to sitting EOB)       General bed mobility comments: cuing for log rolling    Transfers Overall transfer level: Needs assistance Equipment used: Rolling walker (2 wheels) Transfers: Sit to/from Stand Sit to Stand: Contact guard assist, Min assist           General transfer comment: STS from elevated EOB, recliner with cuing for hand placement to push to standing, reach back prior to sitting    Ambulation/Gait Ambulation/Gait assistance: Contact guard assist Gait Distance (Feet): 10 ft + 120 Feet Assistive device: Rolling walker (2 wheels) Gait Pattern/deviations: Decreased step length - right, Decreased dorsiflexion - left, Decreased step length - left, Decreased stride length, Trunk flexed Gait velocity: decreased     General Gait Details: PT provides education re: need to ambulate within base of AD with more upright posture but pt continues to push RW slightly out in front of him, noting standing upright causes increased back pain.  Stairs            Wheelchair Mobility     Tilt Bed    Modified Rankin (Stroke Patients Only)       Balance Overall balance  assessment: Needs assistance Sitting-balance support: Feet supported Sitting balance-Leahy Scale: Fair     Standing balance support: During functional activity, Bilateral upper extremity supported, Reliant on assistive device for balance Standing balance-Leahy Scale: Poor                                Pertinent Vitals/Pain Pain Assessment Pain Assessment: 0-10 Pain Score: 7  Pain Location: low back Pain Descriptors / Indicators: Discomfort Pain Intervention(s): Monitored during session, Repositioned    Home Living Family/patient expects to be discharged to:: Private residence Living Arrangements: Spouse/significant other;Children Available Help at Discharge: Family;Available 24 hours/day Type of Home: House Home Access: Stairs to enter   [employer] of Steps: 1   Home Layout: One level Home Equipment: Rollator (4 wheels);Cane - quad      Prior Function Prior Level of Function : Independent/Modified Independent             Mobility Comments: Pt using rollator since d/c home s/p back sx.       Extremity/Trunk Assessment   Upper Extremity Assessment Upper Extremity Assessment: Overall WFL for tasks assessed    Lower Extremity Assessment Lower Extremity Assessment: Generalized weakness    Cervical / Trunk Assessment Cervical / Trunk Assessment: Back Surgery (bloody drainage on dressing)  Communication   Communication Communication: No apparent difficulties  Cognition Arousal: Alert Behavior During Therapy: WFL for tasks assessed/performed Overall Cognitive Status: Impaired/Different from baseline Area of Impairment: Attention, Memory, Following commands                     Memory: Decreased recall of precautions, Decreased short-term memory Following Commands: Follows one step commands consistently, Follows one step commands with increased time                General Comments      Exercises     Assessment/Plan    PT Assessment Patient needs continued PT services  PT Problem List Decreased strength;Pain;Decreased activity tolerance;Decreased cognition;Decreased balance;Decreased mobility;Decreased knowledge of precautions;Decreased safety awareness;Decreased knowledge of use of DME       PT Treatment Interventions DME  instruction;Gait training;Neuromuscular re-education;Balance training;Stair training;Functional mobility training;Patient/family education;Therapeutic activities;Therapeutic exercise;Manual techniques;Cognitive remediation;Modalities    PT Goals (Current goals can be found in the Care Plan section)  Acute Rehab PT Goals Patient Stated Goal: decreased pain PT Goal Formulation: With patient Time For Goal Achievement: 01/23/23 Potential to Achieve Goals: Good    Frequency Min 1X/week     Co-evaluation               AM-PAC PT "6 Clicks" Mobility  Outcome Measure Help needed turning from your back to your side while in a flat bed without using bedrails?: A Little Help needed moving from lying on your back to sitting on the side of a flat bed without using bedrails?: A Little Help needed moving to and from a bed to a chair (including a wheelchair)?: A Little Help needed standing up from a chair using your arms (e.g., wheelchair or bedside chair)?: A Little Help needed to walk in hospital room?: A Little Help needed climbing 3-5 steps with a railing? : A Little 6 Click Score: 18    End of Session Equipment Utilized During Treatment: Gait belt Activity Tolerance: Patient tolerated treatment well Patient left: in chair;with chair alarm set;with call bell/phone within reach Nurse Communication: Mobility status (BP & c/o pulsing sensation in face) PT  Visit Diagnosis: Muscle weakness (generalized) (M62.81);Pain;Unsteadiness on feet (R26.81);Difficulty in walking, not elsewhere classified (R26.2) Pain - part of body:  (low back)    Time: 2536-6440 PT Time Calculation (min) (ACUTE ONLY): 29 min   Charges:   PT Evaluation $PT Eval Low Complexity: 1 Low PT Treatments $Therapeutic Activity: 8-22 mins PT General Charges $$ ACUTE PT VISIT: 1 Visit         Aleda Grana, PT, DPT 01/09/23, 9:54 AM   Sandi Mariscal 01/09/2023, 9:51 AM

## 2023-01-09 NOTE — Progress Notes (Addendum)
PROGRESS NOTE                                                                                                                                                                                                             Patient Demographics:    Eugene [name], is a 77 y.o. male, DOB - [birth year], RKY:[id number]  Outpatient Primary MD for the patient is Ellyn Hack, MD    LOS - 0  Admit date - 01/08/2023    Chief Complaint  Patient presents with   Post-op Problem   Leg cramping   Fatigue       Brief Narrative (HPI from H&P)   77 y.o. male with medical history significant of CLL, GERD, HTN, hx of prostate cancer, hx of rheumatic fever with residual pulmonary stenosis, OSA on cpap who presented to ED with complaints of difficulty urinating, leg cramping, increased fatigue x 2 days. He is s/p lumbar decompression and fusion secondary to spondylolisthesis L4/L5 on 01/06/23 by Dr. Franky Macho.  He was discharged recently to home, lately he has been getting more weak and tired, came to the hospital for the same, in the ER he was diagnosed with generalized weakness, dehydration, AKI, acute on chronic anemia and heme positive stool and he was admitted to the hospital.   Subjective:    Letitia Libra today has, No headache, No chest pain, No abdominal pain - No Nausea, No new weakness tingling or numbness, no shortness of breath, postop back pain stable.   Assessment  & Plan :    Assessment and Plan: * Acute renal failure superimposed on stage 3b chronic kidney disease (HCC) Due to dehydration, baseline creatinine 1.4, with hydration AKI resolved, continue to hold offending medications i.e. ARB/any diuretics.  Renal ultrasound nonacute.  Continue to monitor, continue gentle hydration for another 10 hours.  Acute on chronic macrocytic anemia Baseline hgb ranges from 10-12 He is now slightly more anemic than baseline due to  combination of hemodilution, perioperative blood loss during his spinal fusion and minimal GI blood loss with heme positive stool.  On PPI, GI on board, checking DIC panel, placed on IV & oral iron supplementation, monitor CBC.   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 Followed at [facility] oncology with Dr. Neil Crouch  Prognostic markers showed trisomy 12, deletion of q13, and a NOTCH 1 mutation. He has had progressive anemia and unclear if this is related to chronic kidney disease or progressive CLL. Previous w/u unremarkable for hemolytic anemia SPEP was negative for M spike and immunofixation was also normal.  Last seen in 09/2022 and declined BM biopsy at that time  Check DIC panel here if stable then outpatient follow-up with [facility] hematology postdischarge.  Spondylolisthesis He is s/p lumbar decompression and fusion secondary to spondylolisthesis L4/L5 on 01/06/23 by Dr. Franky Macho.  No bleeding from site D/c on 01/06/22. POD #3 Neurosurgery aware, no recommendations PT and OT here, if required then SNF.  Essential hypertension Blood pressure currently soft due to dehydration as needed hydralazine only.  Malignant neoplasm of prostate [facility]) S/p radiation therapy   Mixed hyperlipidemia Continue lipitor 10mg  daily   Obstructive sleep apnea Failed to tolerate cpap and oral appliance, supportive Rx        Condition - Fair  Family Communication  :   Daughter Matthias Hughs [phone]  on 01/09/2023 at 10:08 AM -  message left  Code Status :  Full  Consults  :  None  PUD Prophylaxis : PPI   Procedures  :            Disposition Plan  :    Status is: Observation  DVT Prophylaxis  :    SCDs Start: 01/08/23 1635    Lab Results  Component Value Date   PLT 123 (L) 01/09/2023    Diet :  Diet Order             Diet Heart Room service appropriate? Yes; Fluid consistency: Thin  Diet effective  now                    Inpatient Medications  Scheduled Meds:  allopurinol  100 mg Oral Daily   atorvastatin  10 mg Oral Daily   brimonidine  1 drop Both Eyes BID   ferrous sulfate  325 mg Oral BID WC   folic acid  1 mg Oral Daily   gabapentin  100 mg Oral BID   pantoprazole (PROTONIX) IV  40 mg Intravenous Q24H   tamsulosin  0.4 mg Oral QPM   Continuous Infusions: PRN Meds:.acetaminophen **OR** acetaminophen, azelastine, ondansetron **OR** ondansetron (ZOFRAN) IV, oxyCODONE  Antibiotics  :    Anti-infectives (From admission, onward)    None         Objective:   Vitals:   01/08/23 1953 01/09/23 0018 01/09/23 0400 01/09/23 0750  BP: (!) 110/51 (!) 108/50 (!) 115/58 (!) 114/56  Pulse: 100 88 87 88  Resp: 20 20 19  (!) 26  Temp: 99.1 F (37.3 C) 98.9 F (37.2 C) 98.7 F (37.1 C) 99.6 F (37.6 C)  TempSrc: Oral Oral Oral   SpO2: 92% 91% 93% 95%  Weight:      Height:        Wt Readings from Last 3 Encounters:  01/08/23 117.9 kg  01/06/23 117.9 kg  01/04/23 117.9 kg     Intake/Output Summary (Last 24 hours) at 01/09/2023 1001 Last data filed at 01/09/2023 0752 Gross per 24 hour  Intake 1497.82 ml  Output --  Net 1497.82 ml     Physical Exam  Awake Alert, No new F.N deficits, Normal affect [place].AT,PERRAL Supple Neck, No JVD,   Symmetrical Chest wall movement, Good air movement bilaterally, CTAB RRR,No Gallops,Rubs or new Murmurs,  +ve B.Sounds, Abd Soft, No tenderness,  No Cyanosis, Clubbing or edema        Data Review:    Recent Labs  Lab 01/04/23 1600 01/06/23 2033 01/08/23 1358 01/08/23 1633 01/08/23 2334 01/09/23 0835  WBC 32.2* 38.3* 38.1* 36.3* 35.2*  --   HGB 11.8* 11.1* 9.7* 9.0* 8.7*  --   HCT 36.7* 34.5* 30.1* 27.8* 26.9*  --   PLT 165 153 136* 118* 116* 123*  MCV 104.6* 102.4* 102.0* 105.3* 101.5*  --   MCH 33.6 32.9 32.9 34.1* 32.8  --   MCHC 32.2 32.2 32.2 32.4 32.3  --   RDW 12.7 12.6 13.1 13.1 13.1  --   LYMPHSABS   --   --  18.9*  --   --   --   MONOABS  --   --  11.0*  --   --   --   EOSABS  --   --  0.0  --   --   --   BASOSABS  --   --  0.1  --   --   --     Recent Labs  Lab 01/04/23 1600 01/06/23 2033 01/08/23 1358 01/09/23 0350 01/09/23 0835  NA 141  --  139 135  --   K 4.0  --  3.8 3.6  --   CL 108  --  106 107  --   CO2 24  --  23 20*  --   ANIONGAP 9  --  10 8  --   GLUCOSE 83  --  94 109*  --   BUN 19  --  40* 33*  --   CREATININE 1.40* 1.37* 2.30* 1.67*  --   DDIMER  --   --   --   --  PENDING  INR  --   --   --   --  PENDING  CALCIUM 8.7*  --  8.3* 7.7*  --       Recent Labs  Lab 01/04/23 1600 01/08/23 1358 01/09/23 0350 01/09/23 0835  DDIMER  --   --   --  PENDING  INR  --   --   --  PENDING  CALCIUM 8.7* 8.3* 7.7*  --     --------------------------------------------------------------------------------------------------------------- Lab Results  Component Value Date   CHOL 151 09/05/2018   HDL 40 09/05/2018   LDLCALC 87 09/05/2018   TRIG 121 09/05/2018   CHOLHDL 3.8 09/05/2018    Lab Results  Component Value Date   HGBA1C 4.6 07/22/2014   No results for input(s): "TSH", "T4TOTAL", "FREET4", "T3FREE", "THYROIDAB" in the last 72 hours. Recent Labs    01/08/23 1633  VITAMINB12 598  FOLATE 32.0  FERRITIN 119  TIBC 214*  IRON 15*  RETICCTPCT 2.1     Radiology Reports US RENAL  Result Date: 01/08/2023 CLINICAL DATA:  Acute renal failure. EXAM: RENAL / URINARY TRACT ULTRASOUND COMPLETE COMPARISON:  CT 07/17/2019 FINDINGS: Right Kidney: Renal measurements: 12.9 x 5.1 x 6.7 cm = volume: 229 mL. No hydronephrosis. Mild thinning of the renal parenchyma with lobular contours. Increased renal parenchymal echogenicity. The punctate stones on prior CT are not seen by ultrasound. No focal abnormality. Left Kidney: Renal measurements: 11.7 x 6.1 x 5.5 cm = volume: 204 mL. No hydronephrosis. Mild renal parenchymal thinning and increased echogenicity. The punctate  renal stones on prior CT are not seen by ultrasound. Bladder: Only minimally distended. Appears normal for degree of bladder distention. Other: None. IMPRESSION: 1. No obstructive uropathy. 2. Thinning of the renal parenchyma  with increased parenchymal echogenicity suggestive of chronic medical renal disease. 3. The punctate intrarenal stones on prior CT are not seen by ultrasound. Electronically Signed   By: Narda Rutherford M.D.   On: 01/08/2023 22:30   DG Lumbar Spine 2-3 Views  Result Date: 01/06/2023 CLINICAL DATA:  Elective surgery. EXAM: LUMBAR SPINE - 2-3 VIEW COMPARISON:  None Available. FINDINGS: Nine fluoroscopic spot views of the lumbar spine obtained in the operating room. Pedicle screws at L4 and L5 with interbody spacer. Fluoroscopy time 89 seconds. Dose CT 8.68 mGy. IMPRESSION: Intraoperative fluoroscopy during L4-5 fusion. Electronically Signed   By: Narda Rutherford M.D.   On: 01/06/2023 19:29   DG C-Arm 1-60 Min-No Report  Result Date: 01/06/2023 Fluoroscopy was utilized by the requesting physician.  No radiographic interpretation.   DG C-Arm 1-60 Min-No Report  Result Date: 01/06/2023 Fluoroscopy was utilized by the requesting physician.  No radiographic interpretation.      Signature  -   Susa Raring M.D on 01/09/2023 at 10:01 AM   -  To page go to [email or web address]

## 2023-01-10 ENCOUNTER — Inpatient Hospital Stay (HOSPITAL_COMMUNITY): Payer: Medicare HMO

## 2023-01-10 ENCOUNTER — Telehealth: Payer: Self-pay

## 2023-01-10 DIAGNOSIS — N179 Acute kidney failure, unspecified: Secondary | ICD-10-CM | POA: Diagnosis not present

## 2023-01-10 DIAGNOSIS — D509 Iron deficiency anemia, unspecified: Secondary | ICD-10-CM

## 2023-01-10 DIAGNOSIS — D638 Anemia in other chronic diseases classified elsewhere: Secondary | ICD-10-CM | POA: Diagnosis not present

## 2023-01-10 DIAGNOSIS — K219 Gastro-esophageal reflux disease without esophagitis: Secondary | ICD-10-CM

## 2023-01-10 LAB — BASIC METABOLIC PANEL
Anion gap: 10 (ref 5–15)
BUN: 24 mg/dL — ABNORMAL HIGH (ref 8–23)
CO2: 21 mmol/L — ABNORMAL LOW (ref 22–32)
Calcium: 8.2 mg/dL — ABNORMAL LOW (ref 8.9–10.3)
Chloride: 106 mmol/L (ref 98–111)
Creatinine, Ser: 1.6 mg/dL — ABNORMAL HIGH (ref 0.61–1.24)
GFR, Estimated: 44 mL/min — ABNORMAL LOW (ref >60)
Glucose, Bld: 121 mg/dL — ABNORMAL HIGH (ref 70–99)
Potassium: 3.3 mmol/L — ABNORMAL LOW (ref 3.5–5.1)
Sodium: 137 mmol/L (ref 135–145)

## 2023-01-10 LAB — CBC WITH DIFFERENTIAL/PLATELET
Abs Immature Granulocytes: 0 10*3/uL (ref 0.00–0.07)
Basophils Absolute: 0 10*3/uL (ref 0.0–0.1)
Basophils Relative: 0 %
Eosinophils Absolute: 2.6 10*3/uL — ABNORMAL HIGH (ref 0.0–0.5)
Eosinophils Relative: 7 %
HCT: 28.6 % — ABNORMAL LOW (ref 39.0–52.0)
Hemoglobin: 9.5 g/dL — ABNORMAL LOW (ref 13.0–17.0)
Immature Granulocytes: 0 %
Lymphocytes Relative: 69 %
Lymphs Abs: 25.9 10*3/uL — ABNORMAL HIGH (ref 0.7–4.0)
MCH: 33.6 pg (ref 26.0–34.0)
MCHC: 33.2 g/dL (ref 30.0–36.0)
MCV: 101.1 fL — ABNORMAL HIGH (ref 80.0–100.0)
Monocytes Absolute: 0.4 10*3/uL (ref 0.1–1.0)
Monocytes Relative: 1 %
Neutro Abs: 8.6 10*3/uL — ABNORMAL HIGH (ref 1.7–7.7)
Neutrophils Relative %: 23 %
Platelets: 126 10*3/uL — ABNORMAL LOW (ref 150–400)
RBC: 2.83 MIL/uL — ABNORMAL LOW (ref 4.22–5.81)
RDW: 13 % (ref 11.5–15.5)
WBC: 37.6 10*3/uL — ABNORMAL HIGH (ref 4.0–10.5)
nRBC: 0 % (ref 0.0–0.2)
nRBC: 0 /100 WBC

## 2023-01-10 LAB — URINALYSIS, W/ REFLEX TO CULTURE (INFECTION SUSPECTED)
Bacteria, UA: NONE SEEN
Bilirubin Urine: NEGATIVE
Glucose, UA: NEGATIVE mg/dL
Ketones, ur: NEGATIVE mg/dL
Leukocytes,Ua: NEGATIVE
Nitrite: NEGATIVE
Protein, ur: NEGATIVE mg/dL
Specific Gravity, Urine: 1.012 (ref 1.005–1.030)
pH: 6 (ref 5.0–8.0)

## 2023-01-10 LAB — PROCALCITONIN: Procalcitonin: 1.02 ng/mL

## 2023-01-10 LAB — HAPTOGLOBIN: Haptoglobin: 194 mg/dL (ref 34–355)

## 2023-01-10 LAB — MAGNESIUM: Magnesium: 2.2 mg/dL (ref 1.7–2.4)

## 2023-01-10 LAB — PATHOLOGIST SMEAR REVIEW

## 2023-01-10 LAB — BRAIN NATRIURETIC PEPTIDE: B Natriuretic Peptide: 96.2 pg/mL (ref 0.0–100.0)

## 2023-01-10 LAB — C-REACTIVE PROTEIN: CRP: 17 mg/dL — ABNORMAL HIGH (ref <1.0)

## 2023-01-10 MED ORDER — POLYETHYLENE GLYCOL 3350 17 G PO PACK
17.0000 g | PACK | Freq: Two times a day (BID) | ORAL | Status: DC
  Administered 2023-01-10: 17 g via ORAL
  Filled 2023-01-10 (×2): qty 1

## 2023-01-10 MED ORDER — PANTOPRAZOLE SODIUM 40 MG PO TBEC
40.0000 mg | DELAYED_RELEASE_TABLET | Freq: Every day | ORAL | Status: DC
  Administered 2023-01-11: 40 mg via ORAL
  Filled 2023-01-10: qty 1

## 2023-01-10 MED ORDER — DOCUSATE SODIUM 100 MG PO CAPS
200.0000 mg | ORAL_CAPSULE | Freq: Two times a day (BID) | ORAL | Status: DC
  Administered 2023-01-10 – 2023-01-11 (×2): 200 mg via ORAL
  Filled 2023-01-10 (×2): qty 2

## 2023-01-10 NOTE — Plan of Care (Signed)

## 2023-01-10 NOTE — Telephone Encounter (Signed)
-----   Message from Drasco T. Russella Dar sent at 01/10/2023 11:08 AM EDT ----- This patient has IDA, FOBT + stool. See recent inpatient notes from [facility]. He has an outpatient appt with me on 11/11 however he needs a follow up with an APP in about a 1 month or a direct colon/EGD in early to mid October. Leave the 11/11 appt on for follow up post colon/EGD. Thx

## 2023-01-10 NOTE — Progress Notes (Signed)
PROGRESS NOTE                                                                                                                                                                                                             Patient Demographics:    Eugene [name], is a 77 y.o. male, DOB - September 10, 1945, [id number]  Outpatient Primary MD for the patient is Ellyn Hack, MD    LOS - 1  Admit date - 01/08/2023    Chief Complaint  Patient presents with   Post-op Problem   Leg cramping   Fatigue       Brief Narrative (HPI from H&P)   77 y.o. male with medical history significant of CLL, GERD, HTN, hx of prostate cancer, hx of rheumatic fever with residual pulmonary stenosis, OSA on cpap who presented to ED with complaints of difficulty urinating, leg cramping, increased fatigue x 2 days. He is s/p lumbar decompression and fusion secondary to spondylolisthesis L4/L5 on 01/06/23 by Dr. Franky Macho.  He was discharged recently to home, lately he has been getting more weak and tired, came to the hospital for the same, in the ER he was diagnosed with generalized weakness, dehydration, AKI, acute on chronic anemia and heme positive stool and he was admitted to the hospital.   Subjective:   Patient in bed, appears comfortable, denies any headache, no fever, no chest pain or pressure, no shortness of breath , no abdominal pain. No focal weakness.   Assessment  & Plan :    Assessment and Plan: * Acute renal failure superimposed on stage 3b chronic kidney disease (HCC) Due to dehydration, baseline creatinine 1.4, with hydration AKI resolved, continue to hold offending medications i.e. ARB/any diuretics.  Renal ultrasound nonacute.  Continue to monitor, continue gentle hydration for another 10 hours.  Acute on chronic macrocytic anemia Baseline hgb ranges from 10-12 He is now slightly more anemic than baseline due to combination of  hemodilution, perioperative blood loss during his spinal fusion and minimal GI blood loss with heme positive stool.  On PPI, GI on board, checking DIC panel, placed on IV & oral iron supplementation, monitor CBC.   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 Followed at [facility] oncology with Dr. Neil Crouch  Prognostic markers showed  trisomy 12, deletion of q13, and a NOTCH 1 mutation. He has had progressive anemia and unclear if this is related to chronic kidney disease or progressive CLL. Previous w/u unremarkable for hemolytic anemia SPEP was negative for M spike and immunofixation was also normal.  Last seen in 09/2022 and declined BM biopsy at that time  Unremarkable DIC panel, outpatient follow-up with [facility] hematology postdischarge.  Spondylolisthesis He is s/p lumbar decompression and fusion secondary to spondylolisthesis L4/L5 on 01/06/23 by Dr. Franky Macho.  No bleeding from site D/c on 01/06/22. POD #3 Neurosurgery aware, no recommendations PT and OT here, if required then SNF.  His L-spine postop site on 01/10/2023 looks unremarkable.  Essential hypertension Blood pressure improving will reintroduce blood pressure medications gradually.  Malignant neoplasm of prostate [facility]) S/p radiation therapy   Mixed hyperlipidemia Continue lipitor 10mg  daily   Obstructive sleep apnea Failed to tolerate cpap and oral appliance, supportive Rx        Condition - Fair  Family Communication  :   Daughter Matthias Hughs [phone]  on 01/09/2023 at 10:08 AM -  message left  Code Status :  Full  Consults  :  None  PUD Prophylaxis : PPI   Procedures  :            Disposition Plan  :    Status is: Observation  DVT Prophylaxis  :    SCDs Start: 01/08/23 1635    Lab Results  Component Value Date   PLT 126 (L) 01/10/2023    Diet :  Diet Order             Diet Heart Room service appropriate? Yes; Fluid  consistency: Thin  Diet effective now                    Inpatient Medications  Scheduled Meds:  allopurinol  100 mg Oral Daily   atorvastatin  10 mg Oral Daily   brimonidine  1 drop Both Eyes BID   ferrous sulfate  325 mg Oral BID WC   folic acid  1 mg Oral Daily   gabapentin  100 mg Oral BID   [START ON 01/11/2023] pantoprazole  40 mg Oral Daily   tamsulosin  0.4 mg Oral QPM   Continuous Infusions:  iron sucrose     PRN Meds:.acetaminophen **OR** acetaminophen, alum & mag hydroxide-simeth, azelastine, ondansetron **OR** ondansetron (ZOFRAN) IV, oxyCODONE, traMADol  Antibiotics  :    Anti-infectives (From admission, onward)    None         Objective:   Vitals:   01/09/23 2356 01/10/23 0412 01/10/23 0541 01/10/23 0731  BP: (!) 112/54 139/66  (!) 146/67  Pulse: 82 84  88  Resp: 20 15  (!) 23  Temp: 98.8 F (37.1 C) (!) 100.4 F (38 C) 99.7 F (37.6 C) 98.1 F (36.7 C)  TempSrc: Oral Oral Oral   SpO2: 95% 95%  94%  Weight:      Height:        Wt Readings from Last 3 Encounters:  01/08/23 117.9 kg  01/06/23 117.9 kg  01/04/23 117.9 kg     Intake/Output Summary (Last 24 hours) at 01/10/2023 1113 Last data filed at 01/10/2023 0617 Gross per 24 hour  Intake 600 ml  Output 2025 ml  Net -1425 ml     Physical Exam  Awake Alert, No new F.N deficits, Normal affect [place].AT,PERRAL Supple Neck, No JVD,   Symmetrical Chest wall movement, Good air movement bilaterally,  CTAB RRR,No Gallops,Rubs or new Murmurs,  +ve B.Sounds, Abd Soft, No tenderness,   No Cyanosis, chronic trace edema, L-spine postop scar site appears clean       Data Review:    Recent Labs  Lab 01/08/23 1358 01/08/23 1633 01/08/23 2334 01/09/23 0835 01/09/23 1328 01/10/23 0438  WBC 38.1* 36.3* 35.2*  --  42.7* 37.6*  HGB 9.7* 9.0* 8.7*  --  9.7* 9.5*  HCT 30.1* 27.8* 26.9*  --  29.8* 28.6*  PLT 136* 118* 116* 123* 130* 126*  MCV 102.0* 105.3* 101.5*  --  99.7 101.1*  MCH 32.9  34.1* 32.8  --  32.4 33.6  MCHC 32.2 32.4 32.3  --  32.6 33.2  RDW 13.1 13.1 13.1  --  12.9 13.0  LYMPHSABS 18.9*  --   --   --   --  25.9*  MONOABS 11.0*  --   --   --   --  0.4  EOSABS 0.0  --   --   --   --  2.6*  BASOSABS 0.1  --   --   --   --  0.0    Recent Labs  Lab 01/04/23 1600 01/06/23 2033 01/08/23 1358 01/09/23 0350 01/09/23 0835 01/10/23 0438 01/10/23 0635  NA 141  --  139 135  --  137  --   K 4.0  --  3.8 3.6  --  3.3*  --   CL 108  --  106 107  --  106  --   CO2 24  --  23 20*  --  21*  --   ANIONGAP 9  --  10 8  --  10  --   GLUCOSE 83  --  94 109*  --  121*  --   BUN 19  --  40* 33*  --  24*  --   CREATININE 1.40* 1.37* 2.30* 1.67*  --  1.60*  --   CRP  --   --   --   --   --   --  17.0*  DDIMER  --   --   --   --  2.21*  --   --   PROCALCITON  --   --   --   --   --   --  1.02  INR  --   --   --   --  1.3*  --   --   BNP  --   --   --   --   --  96.2  --   MG  --   --   --   --   --  2.2  --   CALCIUM 8.7*  --  8.3* 7.7*  --  8.2*  --       Recent Labs  Lab 01/04/23 1600 01/08/23 1358 01/09/23 0350 01/09/23 0835 01/10/23 0438 01/10/23 0635  CRP  --   --   --   --   --  17.0*  DDIMER  --   --   --  2.21*  --   --   PROCALCITON  --   --   --   --   --  1.02  INR  --   --   --  1.3*  --   --   BNP  --   --   --   --  96.2  --   MG  --   --   --   --  2.2  --   CALCIUM 8.7* 8.3* 7.7*  --  8.2*  --     --------------------------------------------------------------------------------------------------------------- Lab Results  Component Value Date   CHOL 151 09/05/2018   HDL 40 09/05/2018   LDLCALC 87 09/05/2018   TRIG 121 09/05/2018   CHOLHDL 3.8 09/05/2018    Lab Results  Component Value Date   HGBA1C 4.6 07/22/2014   No results for input(s): "TSH", "T4TOTAL", "FREET4", "T3FREE", "THYROIDAB" in the last 72 hours. Recent Labs    01/08/23 1633  VITAMINB12 598  FOLATE 32.0  FERRITIN 119  TIBC 214*  IRON 15*  RETICCTPCT 2.1      Radiology Reports DG Chest Port 1 View  Result Date: 01/10/2023 CLINICAL DATA:  Shortness of breath. EXAM: PORTABLE CHEST 1 VIEW COMPARISON:  12/14/2017 FINDINGS: Cardiac enlargement. Pulmonary artery enlargement is again noted compatible with PA hypertension. No pleural fluid, interstitial edema or airspace disease. Visualized osseous structures are unremarkable. IMPRESSION: 1. No acute findings. 2. Cardiomegaly and pulmonary artery enlargement compatible with PA hypertension. Electronically Signed   By: Signa Kell M.D.   On: 01/10/2023 07:05   US RENAL  Result Date: 01/08/2023 CLINICAL DATA:  Acute renal failure. EXAM: RENAL / URINARY TRACT ULTRASOUND COMPLETE COMPARISON:  CT 07/17/2019 FINDINGS: Right Kidney: Renal measurements: 12.9 x 5.1 x 6.7 cm = volume: 229 mL. No hydronephrosis. Mild thinning of the renal parenchyma with lobular contours. Increased renal parenchymal echogenicity. The punctate stones on prior CT are not seen by ultrasound. No focal abnormality. Left Kidney: Renal measurements: 11.7 x 6.1 x 5.5 cm = volume: 204 mL. No hydronephrosis. Mild renal parenchymal thinning and increased echogenicity. The punctate renal stones on prior CT are not seen by ultrasound. Bladder: Only minimally distended. Appears normal for degree of bladder distention. Other: None. IMPRESSION: 1. No obstructive uropathy. 2. Thinning of the renal parenchyma with increased parenchymal echogenicity suggestive of chronic medical renal disease. 3. The punctate intrarenal stones on prior CT are not seen by ultrasound. Electronically Signed   By: Narda Rutherford M.D.   On: 01/08/2023 22:30   DG Lumbar Spine 2-3 Views  Result Date: 01/06/2023 CLINICAL DATA:  Elective surgery. EXAM: LUMBAR SPINE - 2-3 VIEW COMPARISON:  None Available. FINDINGS: Nine fluoroscopic spot views of the lumbar spine obtained in the operating room. Pedicle screws at L4 and L5 with interbody spacer. Fluoroscopy time 89 seconds. Dose CT  8.68 mGy. IMPRESSION: Intraoperative fluoroscopy during L4-5 fusion. Electronically Signed   By: Narda Rutherford M.D.   On: 01/06/2023 19:29   DG C-Arm 1-60 Min-No Report  Result Date: 01/06/2023 Fluoroscopy was utilized by the requesting physician.  No radiographic interpretation.   DG C-Arm 1-60 Min-No Report  Result Date: 01/06/2023 Fluoroscopy was utilized by the requesting physician.  No radiographic interpretation.      Signature  -   Susa Raring M.D on 01/10/2023 at 11:13 AM   -  To page go to [email or web address]

## 2023-01-10 NOTE — Telephone Encounter (Signed)
-----   Message from Shellia Cleverly sent at 01/09/2023 10:00 AM EDT ----- I expect this patient will be discharged in the next few days.  Can you please coordinate the following:  - Repeat CBC check 7-10 days after hospital discharge  - Schedule follow-up appoint with Dr. Russella Dar (his primary GI) or one of the APP's in about 2 months to discuss EGD/colonoscopy as outpatient  Thanks

## 2023-01-10 NOTE — Telephone Encounter (Signed)
Follow up appointment is scheduled with Dr. Russella Dar on 03/27/23 at 2:30 PM. CBC is ordered in EPIC. Spoke with the patient and made him aware. Stated he is still at the hospital may not remember. Informed patient that I will be sending MyChart  message and mail him a letter as well. Patient verbalized understanding.

## 2023-01-11 ENCOUNTER — Other Ambulatory Visit (HOSPITAL_COMMUNITY): Payer: Self-pay

## 2023-01-11 DIAGNOSIS — N179 Acute kidney failure, unspecified: Secondary | ICD-10-CM | POA: Diagnosis not present

## 2023-01-11 DIAGNOSIS — N1832 Chronic kidney disease, stage 3b: Secondary | ICD-10-CM | POA: Diagnosis not present

## 2023-01-11 LAB — MAGNESIUM: Magnesium: 2.1 mg/dL (ref 1.7–2.4)

## 2023-01-11 LAB — BASIC METABOLIC PANEL
Anion gap: 6 (ref 5–15)
BUN: 21 mg/dL (ref 8–23)
CO2: 24 mmol/L (ref 22–32)
Calcium: 8.4 mg/dL — ABNORMAL LOW (ref 8.9–10.3)
Chloride: 106 mmol/L (ref 98–111)
Creatinine, Ser: 1.39 mg/dL — ABNORMAL HIGH (ref 0.61–1.24)
GFR, Estimated: 52 mL/min — ABNORMAL LOW (ref >60)
Glucose, Bld: 124 mg/dL — ABNORMAL HIGH (ref 70–99)
Potassium: 3.7 mmol/L (ref 3.5–5.1)
Sodium: 136 mmol/L (ref 135–145)

## 2023-01-11 LAB — CBC WITH DIFFERENTIAL/PLATELET
Abs Immature Granulocytes: 0.14 10*3/uL — ABNORMAL HIGH (ref 0.00–0.07)
Basophils Absolute: 0.1 10*3/uL (ref 0.0–0.1)
Basophils Relative: 0 %
Eosinophils Absolute: 0.2 10*3/uL (ref 0.0–0.5)
Eosinophils Relative: 0 %
HCT: 27.9 % — ABNORMAL LOW (ref 39.0–52.0)
Hemoglobin: 9 g/dL — ABNORMAL LOW (ref 13.0–17.0)
Immature Granulocytes: 0 %
Lymphocytes Relative: 61 %
Lymphs Abs: 21.5 10*3/uL — ABNORMAL HIGH (ref 0.7–4.0)
MCH: 32.7 pg (ref 26.0–34.0)
MCHC: 32.3 g/dL (ref 30.0–36.0)
MCV: 101.5 fL — ABNORMAL HIGH (ref 80.0–100.0)
Monocytes Absolute: 7.1 10*3/uL — ABNORMAL HIGH (ref 0.1–1.0)
Monocytes Relative: 20 %
Neutro Abs: 6.8 10*3/uL (ref 1.7–7.7)
Neutrophils Relative %: 19 %
Platelets: 163 10*3/uL (ref 150–400)
RBC: 2.75 MIL/uL — ABNORMAL LOW (ref 4.22–5.81)
RDW: 13.1 % (ref 11.5–15.5)
WBC: 35.7 10*3/uL — ABNORMAL HIGH (ref 4.0–10.5)
nRBC: 0.1 % (ref 0.0–0.2)

## 2023-01-11 LAB — C-REACTIVE PROTEIN: CRP: 15.1 mg/dL — ABNORMAL HIGH (ref <1.0)

## 2023-01-11 LAB — BRAIN NATRIURETIC PEPTIDE: B Natriuretic Peptide: 173 pg/mL — ABNORMAL HIGH (ref 0.0–100.0)

## 2023-01-11 LAB — PROCALCITONIN: Procalcitonin: 0.53 ng/mL

## 2023-01-11 MED ORDER — FOLIC ACID 1 MG PO TABS
1.0000 mg | ORAL_TABLET | Freq: Every day | ORAL | 0 refills | Status: AC
  Filled 2023-01-11: qty 30, 30d supply, fill #0

## 2023-01-11 MED ORDER — FERROUS SULFATE 325 (65 FE) MG PO TABS
325.0000 mg | ORAL_TABLET | Freq: Two times a day (BID) | ORAL | 0 refills | Status: AC
Start: 2023-01-11 — End: ?
  Filled 2023-01-11: qty 100, 50d supply, fill #0

## 2023-01-11 MED ORDER — PANTOPRAZOLE SODIUM 40 MG PO TBEC
40.0000 mg | DELAYED_RELEASE_TABLET | Freq: Every day | ORAL | 0 refills | Status: AC
  Filled 2023-01-11: qty 30, 30d supply, fill #0

## 2023-01-11 NOTE — Plan of Care (Signed)

## 2023-01-11 NOTE — Discharge Instructions (Signed)
Follow with Primary MD Ellyn Hack, MD in 7 days   Get CBC, CMP, 2 view Chest X ray -  checked next visit with your primary MD    Activity: As tolerated with Full fall precautions use walker/cane & assistance as needed  Disposition Home   Diet: Heart Healthy   Special Instructions: If you have smoked or chewed Tobacco  in the last 2 yrs please stop smoking, stop any regular Alcohol  and or any Recreational drug use.  On your next visit with your primary care physician please Get Medicines reviewed and adjusted.  Please request your Prim.MD to go over all Hospital Tests and Procedure/Radiological results at the follow up, please get all Hospital records sent to your Prim MD by signing hospital release before you go home.  If you experience worsening of your admission symptoms, develop shortness of breath, life threatening emergency, suicidal or homicidal thoughts you must seek medical attention immediately by calling 911 or calling your MD immediately  if symptoms less severe.  You Must read complete instructions/literature along with all the possible adverse reactions/side effects for all the Medicines you take and that have been prescribed to you. Take any new Medicines after you have completely understood and accpet all the possible adverse reactions/side effects.

## 2023-01-11 NOTE — TOC Transition Note (Signed)
Transition of Care [facility]) - CM/SW Discharge Note   Patient Details  Name: Lemmie Crus MRN: [id number] Date of Birth: 1946/04/05  Transition of Care [facility]) CM/SW Contact:  Kermit Balo, RN Phone Number: 01/11/2023, 10:11 AM   Clinical Narrative:    Pt is discharging home with home health services through [place]. Information on the AVS. Frances Furbish will contact  him for the first home visit. Walker for home delivered to the room per [employer].  Pt has transportation home.    Final next level of care: Home w Home Health Services Barriers to Discharge: No Barriers Identified   Patient Goals and CMS Choice CMS Medicare.gov Compare Post Acute Care list provided to:: Patient Choice offered to / list presented to : Patient  Discharge Placement                         Discharge Plan and Services Additional resources added to the After Visit Summary for                            [facility] Arranged: PT [facility] Agency: [facility] Date [facility] Agency Contacted: 01/09/23      Social Determinants of Health (SDOH) Interventions SDOH Screenings   Food Insecurity: No Food Insecurity (01/09/2023)  Housing: Low Risk  (01/09/2023)  Transportation Needs: No Transportation Needs (01/09/2023)  Utilities: Not At Risk (01/09/2023)  Financial Resource Strain: Medium Risk (06/14/2022)   Received from [facility]  Social Connections: Unknown (01/14/2022)   Received from [facility]  Tobacco Use: Low Risk  (01/08/2023)     Readmission Risk Interventions     No data to display

## 2023-01-11 NOTE — Discharge Summary (Signed)
Eugene [name] HCW:[id number] DOB: 10-Sep-1945 DOA: 01/08/2023  PCP: Ellyn Hack, MD  Admit date: 01/08/2023  Discharge date: 01/11/2023  Admitted From: [facility]   Disposition:  Home   Recommendations for Outpatient Follow-up:   Follow up with PCP in 1-2 weeks  PCP Please obtain BMP/CBC, 2 view CXR in 1week,  (see Discharge instructions)   PCP Please follow up on the following pending results: Blood pressure closely, check anemia panel, CBC, BMP and magnesium in 7 to 10 days.  To follow outpatient with his neurosurgeon, hematologist and gastroenterologist in 1 to 2 weeks.   Home Health: PT   Equipment/Devices: walker  Consultations: None  Discharge Condition: Stable    CODE STATUS: Full    Diet Recommendation: Heart Healthy     Chief Complaint  Patient presents with   Post-op Problem   Leg cramping   Fatigue     Brief history of present illness from the day of admission and additional interim summary    77 y.o. male with medical history significant of CLL, GERD, HTN, hx of prostate cancer, hx of rheumatic fever with residual pulmonary stenosis, OSA on cpap who presented to ED with complaints of difficulty urinating, leg cramping, increased fatigue x 2 days. He is s/p lumbar decompression and fusion secondary to spondylolisthesis L4/L5 on 01/06/23 by Dr. Franky Macho.   He was discharged recently to home, lately he has been getting more weak and tired, came to the hospital for the same, in the ER he was diagnosed with generalized weakness, dehydration, AKI, acute on chronic anemia and heme positive stool and he was admitted to the hospital                                                                 Hospital Course   Acute renal failure superimposed on stage 3b chronic kidney disease (HCC) Due to dehydration,  baseline creatinine 1.4, with hydration AKI resolved, continue to hold offending medications i.e. ARB/any diuretics.  Renal ultrasound nonacute.  With IV fluids blood pressure and renal function has improved, he is now symptom-free, eager to go home, will be discharged home with home PT and walker, blood pressure gradually improving for now will be discharged only on Norvasc thereafter PCP to monitor and adjust.   Acute on chronic macrocytic anemia Baseline hgb ranges from 10-12 He is now slightly more anemic than baseline due to combination of hemodilution, perioperative blood loss during his spinal fusion and minimal GI blood loss with heme positive stool.  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 Followed at [facility] oncology with Dr. Neil Crouch  Prognostic markers showed trisomy 12, deletion of q13, and a NOTCH 1 mutation. He has had progressive anemia and unclear if this is related to chronic kidney disease or progressive CLL. Previous w/u unremarkable for hemolytic anemia SPEP was negative for M spike and immunofixation was also normal.  Last seen in 09/2022 and declined BM biopsy at that time  Unremarkable DIC panel, outpatient follow-up with [facility] hematology postdischarge.   Spondylolisthesis He is s/p lumbar decompression and fusion secondary to spondylolisthesis L4/L5 on 01/06/23 by Dr. Franky Macho.  No bleeding from site D/c on 01/06/22. POD #3 Neurosurgery aware, no recommendations PT and OT here, if required then SNF.  His L-spine postop site on 01/10/2023 looks unremarkable.  Requested to follow-up with his neurosurgeon within 7 to 10 days of discharge   Essential  hypertension Blood pressure improving, for now we will just resume home Norvasc, thereafter PCP to monitor and adjust blood pressure medications as needed.   Malignant neoplasm of prostate [facility] S/p radiation therapy    Mixed hyperlipidemia Continue lipitor 10mg  daily    Obstructive sleep apnea Failed to tolerate cpap and oral appliance, PCP to monitor will benefit from outpatient pulmonary follow-up.    Discharge diagnosis     Principal Problem:   Acute renal failure superimposed on stage 3b chronic kidney disease (HCC) Active Problems:   Macrocytic anemia   CLL (chronic lymphocytic leukemia) (HCC)   Essential hypertension   Spondylolisthesis   Malignant neoplasm of prostate (HCC)   Mixed hyperlipidemia   Obstructive sleep apnea   Anemia of chronic disease   Iron deficiency anemia   Gastroesophageal reflux disease without esophagitis    Discharge instructions    Discharge Instructions     Discharge instructions   Complete by: As directed    Follow with Primary MD Ellyn Hack, MD in 7 days   Get CBC, CMP, 2 view Chest X ray -  checked next visit with your primary MD    Activity: As tolerated with Full fall precautions use walker/cane & assistance as needed  Disposition Home   Diet: Heart Healthy   Special Instructions: If you have smoked or chewed Tobacco  in the last 2 yrs please stop smoking, stop any regular Alcohol  and or any Recreational drug use.  On your next visit with your primary care physician please Get Medicines reviewed and adjusted.  Please request your Prim.MD to go over all Hospital Tests and Procedure/Radiological results at the follow up, please get all Hospital records sent to your Prim MD by signing hospital release before you go home.  If you experience worsening of your admission symptoms, develop shortness of breath, life threatening emergency, suicidal or homicidal thoughts you must seek medical attention immediately by calling 911  or calling your MD immediately  if symptoms less severe.  You Must read complete instructions/literature along with all the possible adverse reactions/side effects for all the Medicines you take and that have been prescribed to you. Take any new Medicines after you have completely understood and accpet all the possible adverse reactions/side effects.   Increase activity slowly   Complete by: As directed        Discharge Medications   Allergies as of 01/11/2023       Reactions   Other Other (See Comments)   Adhesive tape causes skin to peel and darken.   Penicillins    Has  patient had a PCN reaction causing immediate rash, facial/tongue/throat swelling, SOB or lightheadedness with hypotension:NO Has patient had a PCN reaction causing severe rash involving mucus membranes or skin necrosis:NO Has patient had a PCN reaction that required hospitalization NO Has patient had a PCN reaction occurring within the last 10 years: NO If all of the above answers are "NO", then may proceed with Cephalosporin use.   Latex Rash        Medication List     STOP taking these medications    carvedilol 25 MG tablet Commonly known as: COREG   losartan-hydrochlorothiazide 50-12.5 MG tablet Commonly known as: HYZAAR       TAKE these medications    acetaminophen 650 MG CR tablet Commonly known as: TYLENOL Take 650 mg by mouth 2 (two) times daily.   allopurinol 100 MG tablet Commonly known as: ZYLOPRIM Take 100 mg by mouth daily.   amLODipine 10 MG tablet Commonly known as: NORVASC Take 10 mg by mouth daily.   atorvastatin 10 MG tablet Commonly known as: LIPITOR TAKE 1 TABLET EVERY DAY   azelastine 0.1 % nasal spray Commonly known as: ASTELIN 1-2 sprays each nostril every 12 hours if needed   b complex vitamins capsule Take 1 capsule by mouth daily.   brimonidine 0.2 % ophthalmic solution Commonly known as: ALPHAGAN Place 1 drop into both eyes 2 (two) times daily.   ferrous  sulfate 325 (65 FE) MG tablet Take 1 tablet (325 mg total) by mouth 2 (two) times daily with a meal. What changed:  medication strength how much to take when to take this   folic acid 1 MG tablet Commonly known as: FOLVITE Take 1 tablet (1 mg total) by mouth daily. Start taking on: January 12, 2023   gabapentin 300 MG capsule Commonly known as: NEURONTIN Take 300 mg by mouth 2 (two) times daily.   OVER THE COUNTER MEDICATION Take 3 tablets by mouth daily. Omega XL   oxyCODONE 5 MG immediate release tablet Commonly known as: Oxy IR/ROXICODONE Take 1 tablet (5 mg total) by mouth every 6 (six) hours as needed for up to 8 days for moderate pain ((score 4 to 6)).   pantoprazole 40 MG tablet Commonly known as: PROTONIX Take 1 tablet (40 mg total) by mouth daily. Start taking on: January 12, 2023   polyethylene glycol 17 g packet Commonly known as: MIRALAX / GLYCOLAX Take 17 g by mouth daily as needed for mild constipation or moderate constipation.   tamsulosin 0.4 MG Caps capsule Commonly known as: FLOMAX Take 0.4 mg by mouth every evening.   tiZANidine 4 MG tablet Commonly known as: ZANAFLEX Take 1 tablet (4 mg total) by mouth every 6 (six) hours as needed for muscle spasms.               Durable Medical Equipment  (From admission, onward)           Start     Ordered   01/09/23 1556  For home use only DME Walker rolling  Once       Question Answer Comment  Walker: With 5 Inch Wheels   Patient needs a walker to treat with the following condition Acute renal failure (ARF) (HCC)      01/09/23 1556             Follow-up Information     Care, [facility] Follow up.   Specialty: Home Health Services Why: Frances Furbish will contact you within 48 hours  of discharging home to schedule intial home visit. Contact information: [address] [place] [address] [phone]         Ellyn Hack, MD. Schedule an appointment as soon as  possible for a visit in 1 week(s).   Specialty: Family Medicine Why: Please follow-up with your neurosurgeon Dr. Franky Macho within 7 to 10 days as well. Contact information: [address] [place] [address] [phone]                 Major procedures and Radiology Reports - PLEASE review detailed and final reports thoroughly  -      DG Chest Port 1 View  Result Date: 01/10/2023 CLINICAL DATA:  Shortness of breath. EXAM: PORTABLE CHEST 1 VIEW COMPARISON:  12/14/2017 FINDINGS: Cardiac enlargement. Pulmonary artery enlargement is again noted compatible with PA hypertension. No pleural fluid, interstitial edema or airspace disease. Visualized osseous structures are unremarkable. IMPRESSION: 1. No acute findings. 2. Cardiomegaly and pulmonary artery enlargement compatible with PA hypertension. Electronically Signed   By: Signa Kell M.D.   On: 01/10/2023 07:05   US RENAL  Result Date: 01/08/2023 CLINICAL DATA:  Acute renal failure. EXAM: RENAL / URINARY TRACT ULTRASOUND COMPLETE COMPARISON:  CT 07/17/2019 FINDINGS: Right Kidney: Renal measurements: 12.9 x 5.1 x 6.7 cm = volume: 229 mL. No hydronephrosis. Mild thinning of the renal parenchyma with lobular contours. Increased renal parenchymal echogenicity. The punctate stones on prior CT are not seen by ultrasound. No focal abnormality. Left Kidney: Renal measurements: 11.7 x 6.1 x 5.5 cm = volume: 204 mL. No hydronephrosis. Mild renal parenchymal thinning and increased echogenicity. The punctate renal stones on prior CT are not seen by ultrasound. Bladder: Only minimally distended. Appears normal for degree of bladder distention. Other: None. IMPRESSION: 1. No obstructive uropathy. 2. Thinning of the renal parenchyma with increased parenchymal echogenicity suggestive of chronic medical renal disease. 3. The punctate intrarenal stones on prior CT are not seen by ultrasound. Electronically Signed   By: Narda Rutherford M.D.   On: 01/08/2023  22:30   DG Lumbar Spine 2-3 Views  Result Date: 01/06/2023 CLINICAL DATA:  Elective surgery. EXAM: LUMBAR SPINE - 2-3 VIEW COMPARISON:  None Available. FINDINGS: Nine fluoroscopic spot views of the lumbar spine obtained in the operating room. Pedicle screws at L4 and L5 with interbody spacer. Fluoroscopy time 89 seconds. Dose CT 8.68 mGy. IMPRESSION: Intraoperative fluoroscopy during L4-5 fusion. Electronically Signed   By: Narda Rutherford M.D.   On: 01/06/2023 19:29   DG C-Arm 1-60 Min-No Report  Result Date: 01/06/2023 Fluoroscopy was utilized by the requesting physician.  No radiographic interpretation.   DG C-Arm 1-60 Min-No Report  Result Date: 01/06/2023 Fluoroscopy was utilized by the requesting physician.  No radiographic interpretation.    Micro Results    Recent Results (from the past 240 hour(s))  Surgical pcr screen     Status: Abnormal   Collection Time: 01/04/23  3:57 PM   Specimen: Nasal Mucosa; Nasal Swab  Result Value Ref Range Status   MRSA, PCR NEGATIVE NEGATIVE Final   Staphylococcus aureus POSITIVE (A) NEGATIVE Final    Comment: (NOTE) The Xpert SA Assay (FDA approved for NASAL specimens in patients 38 years of age and older), is one component of a comprehensive surveillance program. It is not intended to diagnose infection nor to guide or monitor treatment. Performed at [facility], [address] [place], [place] [address]   Culture, blood (Routine X 2)  w Reflex to ID Panel     Status: None (Preliminary result)   Collection Time: 01/10/23  6:35 AM   Specimen: BLOOD RIGHT HAND  Result Value Ref Range Status   Specimen Description BLOOD RIGHT HAND  Final   Special Requests   Final    BOTTLES DRAWN AEROBIC AND ANAEROBIC Blood Culture adequate volume   Culture   Final    NO GROWTH < 24 HOURS Performed at [facility], [address], [place], [place] [address]    Report Status PENDING  Incomplete  Culture, blood (Routine X 2) w Reflex to  ID Panel     Status: None (Preliminary result)   Collection Time: 01/10/23  6:37 AM   Specimen: BLOOD  Result Value Ref Range Status   Specimen Description BLOOD LEFT ANTECUBITAL  Final   Special Requests   Final    BOTTLES DRAWN AEROBIC AND ANAEROBIC Blood Culture adequate volume   Culture   Final    NO GROWTH < 24 HOURS Performed at [facility], [address] [place], [place] [address]    Report Status PENDING  Incomplete    Today   Subjective    Friend Vanderpoel today has no headache,no chest abdominal pain,no new weakness tingling or numbness, feels much better wants to go home today.     Objective   Blood pressure (!) 144/66, pulse 88, temperature 99 F (37.2 C), temperature source Oral, resp. rate 20, height 5\' 10"  (1.778 m), weight 117.9 kg, SpO2 96%.   Intake/Output Summary (Last 24 hours) at 01/11/2023 1017 Last data filed at 01/11/2023 0354 Gross per 24 hour  Intake 275 ml  Output 800 ml  Net -525 ml    Exam  Awake Alert, No new F.N deficits,    [place].AT,PERRAL Supple Neck,   Symmetrical Chest wall movement, Good air movement bilaterally, CTAB RRR,No Gallops,   +ve B.Sounds, Abd Soft, Non tender,  No Cyanosis, Clubbing or edema    Data Review   Recent Labs  Lab 01/08/23 1358 01/08/23 1633 01/08/23 2334 01/09/23 0835 01/09/23 1328 01/10/23 0438 01/11/23 0757  WBC 38.1* 36.3* 35.2*  --  42.7* 37.6* 35.7*  HGB 9.7* 9.0* 8.7*  --  9.7* 9.5* 9.0*  HCT 30.1* 27.8* 26.9*  --  29.8* 28.6* 27.9*  PLT 136* 118* 116* 123* 130* 126* 163  MCV 102.0* 105.3* 101.5*  --  99.7 101.1* 101.5*  MCH 32.9 34.1* 32.8  --  32.4 33.6 32.7  MCHC 32.2 32.4 32.3  --  32.6 33.2 32.3  RDW 13.1 13.1 13.1  --  12.9 13.0 13.1  LYMPHSABS 18.9*  --   --   --   --  25.9* 21.5*  MONOABS 11.0*  --   --   --   --  0.4 7.1*  EOSABS 0.0  --   --   --   --  2.6* 0.2  BASOSABS 0.1  --   --   --   --  0.0 0.1    Recent Labs  Lab 01/04/23 1600 01/06/23 2033 01/08/23 1358  01/09/23 0350 01/09/23 0835 01/10/23 0438 01/10/23 0635 01/11/23 0757  NA 141  --  139 135  --  137  --  136  K 4.0  --  3.8 3.6  --  3.3*  --  3.7  CL 108  --  106 107  --  106  --  106  CO2 24  --  23 20*  --  21*  --  24  ANIONGAP 9  --  10 8  --  10  --  6  GLUCOSE 83  --  94 109*  --  121*  --  124*  BUN 19  --  40* 33*  --  24*  --  21  CREATININE 1.40* 1.37* 2.30* 1.67*  --  1.60*  --  1.39*  CRP  --   --   --   --   --   --  17.0* 15.1*  DDIMER  --   --   --   --  2.21*  --   --   --   PROCALCITON  --   --   --   --   --   --  1.02 0.53  INR  --   --   --   --  1.3*  --   --   --   BNP  --   --   --   --   --  96.2  --  173.0*  MG  --   --   --   --   --  2.2  --  2.1  CALCIUM 8.7*  --  8.3* 7.7*  --  8.2*  --  8.4*    Total Time in preparing paper work, data evaluation and todays exam - 35 minutes  Signature  -    Susa Raring M.D on 01/11/2023 at 10:17 AM   -  To page go to [email or web address]

## 2023-01-11 NOTE — Progress Notes (Signed)
Physical Therapy Treatment Patient Details Name: Eugene [name] MRN: [id number] DOB: 29-Jul-1945 Today's Date: 01/11/2023   History of Present Illness Pt is a 77 y/o M admitted on 01/08/23 after presenting to the ED with c/o difficulty urinating, leg cramping & increased fatigue x 2 days. Pt is s/p lumbar decompression & fusion 2/2 spondylolisthesis L4/L5 on 01/06/23. 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 PMH: CLL, GERD, HTN, prostate CA, rheumatic fever with residual pulmonary stenosis, OSA on CPAP, anxiety, cervical DDD, heart murmur, obesity    PT Comments  Pt greeted resting in bed with good progress towards acute goals. Pt able to recall 3/3 back precautions and demonstrate adherence with all mobility. Pt requiring grossly supervision assist for bed mobility, transfers and gait with RW for support. Pt was educated on continued walker use to maximize functional independence, safety, and decrease risk for falls as well as safe car entry/exit and appropriate activity progression with pt verbalizing understanding. Anticipate safe discharge, with assist level outlined below, once medically cleared, will continue to follow acutely.     If plan is discharge home, recommend the following: A little help with walking and/or transfers;A little help with bathing/dressing/bathroom;Assistance with cooking/housework;Assist for transportation;Help with stairs or ramp for entrance   Can travel by private vehicle        Equipment Recommendations  Rolling walker (2 wheels)    Recommendations for Other Services       Precautions / Restrictions Precautions Precautions: Fall;Back Precaution Comments: Recalls 3/3 precautions Required Braces or Orthoses:  (none) Restrictions Weight Bearing Restrictions: No     Mobility  Bed Mobility Overal bed mobility: Needs Assistance Bed Mobility: Rolling, Sidelying to Sit Rolling: Supervision, Used rails Sidelying to sit:  Supervision       General bed mobility comments: supervision for safety, good adherence to back precaution    Transfers Overall transfer level: Needs assistance Equipment used: Rolling walker (2 wheels) Transfers: Sit to/from Stand Sit to Stand: Supervision           General transfer comment: cues for hand placement    Ambulation/Gait Ambulation/Gait assistance: Contact guard assist, Supervision Gait Distance (Feet): 405 Feet Assistive device: Rolling walker (2 wheels) Gait Pattern/deviations: Decreased step length - right, Decreased dorsiflexion - left, Decreased step length - left, Decreased stride length, Trunk flexed Gait velocity: decreased     General Gait Details: cues for pt to ambulate within base of AD with more upright posture but pt continues to push RW slightly out in front of him, noting standing upright causes increased back pain.   Stairs             Wheelchair Mobility     Tilt Bed    Modified Rankin (Stroke Patients Only)       Balance Overall balance assessment: Needs assistance Sitting-balance support: Feet supported Sitting balance-Leahy Scale: Fair     Standing balance support: During functional activity, Bilateral upper extremity supported, Reliant on assistive device for balance Standing balance-Leahy Scale: Poor                              Cognition Arousal: Alert Behavior During Therapy: WFL for tasks assessed/performed Overall Cognitive Status: Within Functional Limits for tasks assessed  Exercises      General Comments General comments (skin integrity, edema, etc.): VSS on RA      Pertinent Vitals/Pain Pain Assessment Pain Assessment: 0-10 Pain Score: 4  Pain Location: low back. L hip Pain Descriptors / Indicators: Discomfort Pain Intervention(s): Monitored during session, Limited activity within patient's tolerance    Home Living                           Prior Function            PT Goals (current goals can now be found in the care plan section) Acute Rehab PT Goals Patient Stated Goal: decreased pain PT Goal Formulation: With patient Time For Goal Achievement: 01/23/23 Progress towards PT goals: Progressing toward goals    Frequency    Min 1X/week      PT Plan      Co-evaluation              AM-PAC PT "6 Clicks" Mobility   Outcome Measure  Help needed turning from your back to your side while in a flat bed without using bedrails?: A Little Help needed moving from lying on your back to sitting on the side of a flat bed without using bedrails?: A Little Help needed moving to and from a bed to a chair (including a wheelchair)?: A Little Help needed standing up from a chair using your arms (e.g., wheelchair or bedside chair)?: A Little Help needed to walk in hospital room?: A Little Help needed climbing 3-5 steps with a railing? : A Little 6 Click Score: 18    End of Session   Activity Tolerance: Patient tolerated treatment well Patient left: in chair;with call bell/phone within reach Nurse Communication: Mobility status PT Visit Diagnosis: Muscle weakness (generalized) (M62.81);Pain;Unsteadiness on feet (R26.81);Difficulty in walking, not elsewhere classified (R26.2) Pain - part of body:  (low back)     Time: 1601-0932 PT Time Calculation (min) (ACUTE ONLY): 25 min  Charges:    $Gait Training: 8-22 mins $Therapeutic Activity: 8-22 mins PT General Charges $$ ACUTE PT VISIT: 1 Visit                     Eugene [name] R. PTA Acute Rehabilitation Services Office: [phone]   Eugene [name] 01/11/2023, 10:38 AM

## 2023-01-11 NOTE — Telephone Encounter (Signed)
No appts available with app in 1 month. Left message on machine to call back to see how the pt would like to proceed.  Direct colon EGD vs office visit

## 2023-01-12 NOTE — Telephone Encounter (Signed)
Left message on machine to call back  

## 2023-01-13 NOTE — Telephone Encounter (Signed)
The pt prefers to come in and see an app to discuss colon EGD.  There are not appts available today. I will call the pt on Tues to set up a 7 day hold.

## 2023-01-15 LAB — CULTURE, BLOOD (ROUTINE X 2)
Culture: NO GROWTH
Culture: NO GROWTH
Special Requests: ADEQUATE
Special Requests: ADEQUATE

## 2023-01-17 NOTE — Telephone Encounter (Signed)
The pt has an appt with Gunnar Fusi on 01/24/23 at 130 pm as requested per Dr Russella Dar.  The pt verbalized understanding

## 2023-01-18 ENCOUNTER — Ambulatory Visit: Payer: Medicare HMO | Admitting: Physician Assistant

## 2023-01-24 ENCOUNTER — Encounter: Payer: Self-pay | Admitting: Nurse Practitioner

## 2023-01-24 ENCOUNTER — Ambulatory Visit: Payer: Medicare HMO | Admitting: Nurse Practitioner

## 2023-01-24 VITALS — BP 130/78 | HR 100 | Ht 70.0 in | Wt 247.0 lb

## 2023-01-24 DIAGNOSIS — D649 Anemia, unspecified: Secondary | ICD-10-CM | POA: Diagnosis not present

## 2023-01-24 DIAGNOSIS — K5909 Other constipation: Secondary | ICD-10-CM | POA: Diagnosis not present

## 2023-01-24 DIAGNOSIS — K219 Gastro-esophageal reflux disease without esophagitis: Secondary | ICD-10-CM

## 2023-01-24 MED ORDER — FAMOTIDINE 20 MG PO TABS: 20.0000 mg | ORAL_TABLET | Freq: Every day | ORAL | 3 refills | Status: AC

## 2023-01-24 NOTE — Patient Instructions (Addendum)
We have sent the following medications to your pharmacy for you to pick up at your convenience: FAMOTIDINE    Acid Reflux  Below are some measures you can take to possibly improve acid reflux symptoms . We may have discussed some of these today in the office. Not everything on this list may apply to you   --If you are taking anti-reflux ( GERD) medication be sure to take it 30 minutes before breakfast and if taking twice daily then also second dose should be 30 minutes before dinner.   --Avoid late meals / bedtime snacks.   --Avoid trigger foods ( foods which you know tend to aggravate you reflux symptoms). Some common trigger foods include spicy foods, fatty foods, acidic foods, chocolate and caffeine.  --Elevate the head of bed 6-8 inches on blocks or bricks. If not able to elevate the head of the bed consider purchasing a wedge pillow to sleep on.    --Weight reduction / maintain a healthy BMI ( body mass index) may be help with reflux symptoms  --Sometimes with the above mentioned "lifestyle changes" patients are able to reduce the amount of GERD medications they take. Our goal is to have you on the lowest effective dose of medication  _______________________________________________________  If your blood pressure at your visit was 140/90 or greater, please contact your primary care physician to follow up on this.  _______________________________________________________  If you are age 69 or older, your body mass index should be between 23-30. Your Body mass index is 35.44 kg/m. If this is out of the aforementioned range listed, please consider follow up with your Primary Care Provider.  If you are age 72 or younger, your body mass index should be between 19-25. Your Body mass index is 35.44 kg/m. If this is out of the aformentioned range listed, please consider follow up with your Primary Care Provider.   ________________________________________________________  The [facility]  providers would like to encourage you to use [facility] to communicate with providers for non-urgent requests or questions.  Due to long hold times on the telephone, sending your provider a message by [facility] may be a faster and more efficient way to get a response.  Please allow 48 business hours for a response.  Please remember that this is for non-urgent requests.  _______________________________________________________ It was a pleasure to see you today!  Thank you for trusting me with your gastrointestinal care!

## 2023-01-24 NOTE — Progress Notes (Signed)
ASSESSMENT & PLAN   77 y.o.  male known to Dr. Russella Dar ( 2016).    Acute on chronic anemia ( macrocytic)  / FOBT+. Recently evaluated by [place] in hospital. Anemia felt to be due in part to recent spinal surgery , CLL and AKI. We recommended outpatient EGD / colonoscopy. Last colonoscopy was thought to be in 2009. Iron panel with normal ferritin (possibly falsely normal as an acute phase reactant), TIBC and iron sat both low. Not clear cut IDA. Normal B12/folate.  -- Sound like patient actually had a colonoscopy within the last 3 years possible by Dr. Elnoria Ewell or Dr. Loreta Ave (says it was done on [facility] )  --Given this new information will need to hold off on scheduling endoscopic procedures until we know if he needs EGD and colonoscopy vrs just an EGD.  Will request records  --Patient feels he needs more time to recover from back surgery prior to undergoing endoscopic evaluation. He has a follow up scheduled with Dr. Russella Dar in November, we can reevaluate everything at that time.      --Had follow up labs with PCP at [facility] on 9/4. Will request those results. Given macrocytosis  should have a TSH.  --Continue oral iron for now.   Chronic lymphocytic leukemia  Chronic constipation.  Manages well with Miralax as needed   Jehovah's Witness.   Has declined blood products. Accepted IV iron in hospital.   GERD  Started on daily Pantoprazole last month for increased symptoms. Doing better but having breakthrough symptoms at night with pyrosis and bitter taste in mouth  --Continue pantoprazole 40 mg daily before breakfast --Add Famotidine 20 mg at bedtime.   Lumbar spine stenosis, status post lumbar decompression and fusion on 01/06/2023  History of sigmoid diverticulosis , kidney stones, CKD 3B , prostate cancer,  hypertension, OSA  see PMH below for additional history  HPI   Chief complaint : Follow-up on anemia.  Discuss EGD and colonoscopy  Eugene [name] was last seen in the office in  2016.  He underwent lumbar back surgery late August.  He was rehospitalized almost immediately with fatigue.  He was found to have had a decline in hemoglobin and was Hemoccult positive for which we saw him in consult.  In May 2024 his hemoglobin was 11.5 ([facility]).  Following back surgery hemoglobin found to be in the 9 range.  He lost about 600 mL during surgery.  Decline in hemoglobin was thought to be multifactorial (surgery, AKI, CLL).  However he was Hemoccult positive.  Given recent back surgery he was not a good candidate for endoscopic workup during that admission.  He was made this follow-up appointment in the office to arrange for outpatient EGD and colonoscopy  Eugene [name] is here with his daughter.  He tells me that he actually has had a colonoscopy since the 1 he had with [place] in 2009.  He describes having a colonoscopy on [place] straight within the last 3 years.  He is not sure of the findings.   Uless has not seen any blood in his stools.  No black stools.  He takes MiraLAX as needed for constipation. No NSAID use.  He has been taking oral iron since hospital discharge.    In the hospital Eugene [name] was having GERD symptoms.  We started him on daily pantoprazole.  Pantoprazole has helped daytime symptoms but effect seems to wear off by the evening.  He has been having nocturnal regurgitation   Previous  GI Endoscopies / Labs / Imaging   **May not include all endoscopic evaluations   04/30/2008 colonoscopy by Dr. Jarold Motto with a sessile polyp found to be hyperplastic and repeat recommended in 10 years.       Latest Ref Rng & Units 09/05/2018    8:26 AM 07/22/2014   11:38 AM 09/17/2013   10:00 AM  Hepatic Function  Total Protein 6.0 - 8.5 g/dL 6.5  7.2  7.0   Albumin 3.7 - 4.7 g/dL 4.3  4.3  4.1   AST 0 - 40 IU/L 26  36  35   ALT 0 - 44 IU/L 22  23  26    Alk Phosphatase 39 - 117 IU/L 86  70  75   Total Bilirubin 0.0 - 1.2 mg/dL 0.9  0.8  0.9   Bilirubin, Direct 0.0 - 0.3 mg/dL   0.1         Latest Ref Rng & Units 01/11/2023    7:57 AM 01/10/2023    4:38 AM 01/09/2023    1:28 PM  CBC  WBC 4.0 - 10.5 K/uL 35.7  37.6  42.7   Hemoglobin 13.0 - 17.0 g/dL 9.0  9.5  9.7   Hematocrit 39.0 - 52.0 % 27.9  28.6  29.8   Platelets 150 - 400 K/uL 163  126  130      Past Medical History:  Diagnosis Date   Allergic rhinitis due to pollen    Allergy    seasonal   Anxiety    Blood transfusion without reported diagnosis    do not except blood transfusions.   Cataract    beginning stage   CLL (chronic lymphocytic leukemia) (HCC)    DDD (degenerative disc disease), cervical    Severe w/ osteophytes on xray 3/16   Difficult intubation    ED (erectile dysfunction)    GERD (gastroesophageal reflux disease)    Heart murmur    History of Holter monitoring    started on march 19,2020   Hypertension    OBESITY    Osteoarthritis    Prostate cancer (HCC)    Pulmonary stenosis, valvar    TEE 07/2011: stable, LVEF 55%, small PFO   Refusal of blood transfusions as patient is Jehovah's Witness    Rheumatic fever    residual pulm stenosis, follows with ganji every 17mo   RHINITIS, CHRONIC    SLEEP APNEA, OBSTRUCTIVE dx 2000   NPSG 2000:  AHI 76/hr CPAP titrated to 11cm 2002     Past Surgical History:  Procedure Laterality Date   BACK SURGERY  2021   COLONOSCOPY     PROSTATE BIOPSY     ROTATOR CUFF REPAIR Right    2018   TEE WITHOUT CARDIOVERSION  08/02/2011   Procedure: TRANSESOPHAGEAL ECHOCARDIOGRAM (TEE);  Surgeon: Pamella Pert, MD;  Location: [facility] END[facility]COPY;  Service: Cardiovascular;  Laterality: N/A;    Family History  Problem Relation Age of Onset   Heart disease Mother    Hypertension Other    Melanoma Brother    Heart disease Brother    Colon cancer Neg Hx    Stomach cancer Neg Hx    Rectal cancer Neg Hx    Esophageal cancer Neg Hx    Prostate cancer Neg Hx    Pancreatic cancer Neg Hx     Current Medications, Allergies, Family History and Social History  were reviewed in [employer] electronic medical record.     Current Outpatient Medications  Medication Sig Dispense  Refill   famotidine (PEPCID) 20 MG tablet Take 1 tablet (20 mg total) by mouth at bedtime. 30 tablet 3   acetaminophen (TYLENOL) 650 MG CR tablet Take 650 mg by mouth 2 (two) times daily.     allopurinol (ZYLOPRIM) 100 MG tablet Take 100 mg by mouth daily.     amLODipine (NORVASC) 10 MG tablet Take 10 mg by mouth daily.     atorvastatin (LIPITOR) 10 MG tablet TAKE 1 TABLET EVERY DAY 90 tablet 3   azelastine (ASTELIN) 0.1 % nasal spray 1-2 sprays each nostril every 12 hours if needed 90 mL 4   b complex vitamins capsule Take 1 capsule by mouth daily.     brimonidine (ALPHAGAN) 0.2 % ophthalmic solution Place 1 drop into both eyes 2 (two) times daily.     ferrous sulfate 325 (65 FE) MG tablet Take 1 tablet (325 mg total) by mouth 2 (two) times daily with a meal. 100 tablet 0   folic acid (FOLVITE) 1 MG tablet Take 1 tablet (1 mg total) by mouth daily. 30 tablet 0   gabapentin (NEURONTIN) 300 MG capsule Take 300 mg by mouth 2 (two) times daily.     OVER THE COUNTER MEDICATION Take 3 tablets by mouth daily. Omega XL     pantoprazole (PROTONIX) 40 MG tablet Take 1 tablet (40 mg total) by mouth daily. 30 tablet 0   polyethylene glycol (MIRALAX / GLYCOLAX) 17 g packet Take 17 g by mouth daily as needed for mild constipation or moderate constipation.     tamsulosin (FLOMAX) 0.4 MG CAPS capsule Take 0.4 mg by mouth every evening.     tiZANidine (ZANAFLEX) 4 MG tablet Take 1 tablet (4 mg total) by mouth every 6 (six) hours as needed for muscle spasms. 60 tablet 0   No current facility-administered medications for this visit.    Review of Systems: No chest pain. No shortness of breath. No urinary complaints.    Physical Exam  Wt Readings from Last 3 Encounters:  01/24/23 247 lb (112 kg)  01/08/23 260 lb (117.9 kg)  01/06/23 260 lb (117.9 kg)    Ht 5\' 10"  (1.778 m)   Wt  247 lb (112 kg)   BMI 35.44 kg/m  Constitutional:  Pleasant male in no acute distress. Psychiatric: Normal mood and affect. Behavior is normal. EENT: Pupils normal.  Conjunctivae are normal. No scleral icterus. Neck supple.  Cardiovascular: Normal rate, regular rhythm.  Pulmonary/chest: Effort normal and breath sounds normal. No wheezing, rales or rhonchi. Abdominal: Soft, nondistended, nontender. Bowel sounds active throughout. There are no masses palpable. No hepatomegaly. Neurological: Alert and oriented to person place and time.    Willette Cluster, NP  01/24/2023, 1:28 PM

## 2023-02-04 ENCOUNTER — Other Ambulatory Visit: Payer: Self-pay

## 2023-02-07 ENCOUNTER — Other Ambulatory Visit: Payer: Self-pay

## 2023-02-08 ENCOUNTER — Other Ambulatory Visit: Payer: Self-pay

## 2023-02-09 ENCOUNTER — Other Ambulatory Visit (HOSPITAL_COMMUNITY): Payer: Self-pay

## 2023-02-21 ENCOUNTER — Other Ambulatory Visit (HOSPITAL_COMMUNITY): Payer: Self-pay

## 2023-03-10 ENCOUNTER — Other Ambulatory Visit (HOSPITAL_BASED_OUTPATIENT_CLINIC_OR_DEPARTMENT_OTHER): Payer: Self-pay

## 2023-03-10 MED ORDER — COVID-19 MRNA VAC-TRIS(PFIZER) 30 MCG/0.3ML IM SUSY
0.3000 mL | PREFILLED_SYRINGE | Freq: Once | INTRAMUSCULAR | 0 refills | Status: AC
  Filled 2023-03-10: qty 0.3, 1d supply, fill #0

## 2023-03-10 MED ORDER — INFLUENZA VAC A&B SURF ANT ADJ 0.5 ML IM SUSY
0.5000 mL | PREFILLED_SYRINGE | Freq: Once | INTRAMUSCULAR | 0 refills | Status: AC
  Filled 2023-03-10: qty 0.5, 1d supply, fill #0

## 2023-03-27 ENCOUNTER — Ambulatory Visit: Payer: Medicare HMO | Admitting: Gastroenterology

## 2023-03-27 ENCOUNTER — Encounter: Payer: Self-pay | Admitting: Gastroenterology

## 2023-03-27 VITALS — BP 118/70 | HR 83 | Ht 70.0 in | Wt 246.0 lb

## 2023-03-27 DIAGNOSIS — D509 Iron deficiency anemia, unspecified: Secondary | ICD-10-CM | POA: Diagnosis not present

## 2023-03-27 DIAGNOSIS — Z860100 Personal history of colon polyps, unspecified: Secondary | ICD-10-CM | POA: Diagnosis not present

## 2023-03-27 MED ORDER — NA SULFATE-K SULFATE-MG SULF 17.5-3.13-1.6 GM/177ML PO SOLN: 1.0000 | Freq: Once | ORAL | 0 refills | Status: AC

## 2023-03-27 NOTE — Progress Notes (Signed)
Assessment     Heme + stool - R/O colorectal neoplasms, ulcer, AVM Acute on chronic anemia felt secondary to IDA, CKD - see #1 Personal history of adenomatous colon polyps GERD, mild Chronic constipation S/P L4, L5 fusion in Aug 2024 CLL History of prostate adenocarcinoma PAH Mild pulm valve stenosis, mild TR, mild to mod MR OSA Jehovah's Witness    Recommendations    Schedule colonoscopy, elevated risk given comorbidities. The risks (including bleeding, perforation, infection, missed lesions, medication reactions and possible hospitalization or surgery if complications occur), benefits, and alternatives to colonoscopy with possible biopsy and possible polypectomy were discussed with the patient and they consent to proceed.   Schedule EGD, elevated risk given comorbidities. The risks (including bleeding, perforation, infection, missed lesions, medication reactions and possible hospitalization or surgery if complications occur), benefits, and alternatives to endoscopy with possible biopsy and possible dilation were discussed with the patient and they consent to proceed.   Consider VCE if no source for IDA and/or heme positive stool is found Miralax qod, increase to qd for 5 days prior to colonoscopy Hold Fe for 5 days prior do colonoscopy  Continue famotidine 20 mg daily as needed, follow antireflux measures   HPI    This is a 77 year old male with IDA and heme + stool. Only current GI symptom is constipation. Colonoscopy by Dr. Elnoria Mordecai in June 2022 showed ten colon polyps measuring 3 to 5 mmin the sigmoid, descending and transverse colon.  Diffuse melanosis coli was noted as well as sigmoid colon diverticulosis.  Dr. Elnoria Dionysios recommended a repeat colonoscopy in 3 years.  Chronic constipation is fairly well-controlled on MiraLAX every other day.  He has intermittent reflux symptoms and takes famotidine when symptoms are active.  No other gastrointestinal complaints. Denies weight loss,  abdominal pain, diarrhea, change in stool caliber, melena, hematochezia, nausea, vomiting, dysphagia, chest pain.   Labs / Imaging       Latest Ref Rng & Units 09/05/2018    8:26 AM 07/22/2014   11:38 AM 09/17/2013   10:00 AM  Hepatic Function  Total Protein 6.0 - 8.5 g/dL 6.5  7.2  7.0   Albumin 3.7 - 4.7 g/dL 4.3  4.3  4.1   AST 0 - 40 IU/L 26  36  35   ALT 0 - 44 IU/L 22  23  26    Alk Phosphatase 39 - 117 IU/L 86  70  75   Total Bilirubin 0.0 - 1.2 mg/dL 0.9  0.8  0.9   Bilirubin, Direct 0.0 - 0.3 mg/dL   0.1        Latest Ref Rng & Units 01/11/2023    7:57 AM 01/10/2023    4:38 AM 01/09/2023    1:28 PM  CBC  WBC 4.0 - 10.5 K/uL 35.7  37.6  42.7   Hemoglobin 13.0 - 17.0 g/dL 9.0  9.5  9.7   Hematocrit 39.0 - 52.0 % 27.9  28.6  29.8   Platelets 150 - 400 K/uL 163  126  130     DG Chest Port 1 View CLINICAL DATA:  Shortness of breath.  EXAM: PORTABLE CHEST 1 VIEW  COMPARISON:  12/14/2017  FINDINGS: Cardiac enlargement. Pulmonary artery enlargement is again noted compatible with PA hypertension. No pleural fluid, interstitial edema or airspace disease. Visualized osseous structures are unremarkable.  IMPRESSION: 1. No acute findings. 2. Cardiomegaly and pulmonary artery enlargement compatible with PA hypertension.  Electronically Signed   By: Signa Kell  M.D.   On: 01/10/2023 07:05   Current Medications, Allergies, Past Medical History, Past Surgical History, Family History and Social History were reviewed in [employer] record.   Physical Exam: General: Well developed, well nourished, no acute distress Head: Normocephalic and atraumatic Eyes: Sclerae anicteric, EOMI Ears: Normal auditory acuity Mouth: No deformities or lesions noted Lungs: Clear throughout to auscultation Heart: Regular rate and rhythm; No rubs or bruits; soft systolic murmur Abdomen: Soft, non tender and non distended. No masses, hepatosplenomegaly or hernias  noted. Normal Bowel sounds Rectal: Deferred to colonoscopy Musculoskeletal: Symmetrical with no gross deformities  Pulses:  Normal pulses noted Extremities: No edema or deformities noted Neurological: Alert oriented x 4, grossly nonfocal Psychological:  Alert and cooperative. Normal mood and affect   Eugene [name] T. Russella Dar, MD 03/27/2023, 2:40 PM

## 2023-03-27 NOTE — Patient Instructions (Signed)
You have been scheduled for an endoscopy and colonoscopy. Please follow the written instructions given to you at your visit today.  Please pick up your prep supplies at the pharmacy within the next 1-3 days.  If you use inhalers (even only as needed), please bring them with you on the day of your procedure.  DO NOT TAKE 7 DAYS PRIOR TO TEST- Trulicity (dulaglutide) Ozempic, Wegovy (semaglutide) Mounjaro (tirzepatide) Bydureon Bcise (exanatide extended release)  DO NOT TAKE 1 DAY PRIOR TO YOUR TEST Rybelsus (semaglutide) Adlyxin (lixisenatide) Victoza (liraglutide) Byetta (exanatide) ___________________________________________________________________________  The [facility] providers would like to encourage you to use [facility] to communicate with providers for non-urgent requests or questions.  Due to long hold times on the telephone, sending your provider a message by [facility] may be a faster and more efficient way to get a response.  Please allow 48 business hours for a response.  Please remember that this is for non-urgent requests.   Due to recent changes in healthcare laws, you may see the results of your imaging and laboratory studies on MyChart before your provider has had a chance to review them.  We understand that in some cases there may be results that are confusing or concerning to you. Not all laboratory results come back in the same time frame and the provider may be waiting for multiple results in order to interpret others.  Please give [place] 48 hours in order for your provider to thoroughly review all the results before contacting the office for clarification of your results.   Thank you for choosing me and [facility].  Venita Lick. Pleas Koch., MD., Clementeen Graham

## 2023-03-30 ENCOUNTER — Other Ambulatory Visit: Payer: Self-pay | Admitting: Family Medicine

## 2023-03-30 DIAGNOSIS — L089 Local infection of the skin and subcutaneous tissue, unspecified: Secondary | ICD-10-CM

## 2023-04-04 ENCOUNTER — Encounter: Payer: Self-pay | Admitting: Family Medicine

## 2023-04-11 ENCOUNTER — Ambulatory Visit
Admission: RE | Admit: 2023-04-11 | Discharge: 2023-04-11 | Disposition: A | Discharge status: Home / Self Care | Payer: Medicare HMO | Source: Ambulatory Visit | Attending: Family Medicine | Admitting: Family Medicine

## 2023-04-11 DIAGNOSIS — L089 Local infection of the skin and subcutaneous tissue, unspecified: Secondary | ICD-10-CM

## 2023-04-20 ENCOUNTER — Telehealth: Payer: Self-pay | Admitting: *Deleted

## 2023-04-20 NOTE — Telephone Encounter (Signed)
Dr. Russella Dar,  This pt is scheduled with you on 12/9.  He is a documented difficult intubation and his procedure will need to be done at the hospital.   Thanks,  Cathlyn Parsons

## 2023-04-20 NOTE — Telephone Encounter (Signed)
Per Jonny Ruiz Nulty's note please schedule this patient's procedures at the hospital

## 2023-04-21 ENCOUNTER — Other Ambulatory Visit: Payer: Self-pay

## 2023-04-21 DIAGNOSIS — Z860100 Personal history of colon polyps, unspecified: Secondary | ICD-10-CM

## 2023-04-21 DIAGNOSIS — D509 Iron deficiency anemia, unspecified: Secondary | ICD-10-CM

## 2023-04-21 NOTE — Telephone Encounter (Signed)
Informed patient of Dr. Ardell Isaacs and Jonny Ruiz Nulty's recommendations to be schedule at the hospital due to difficult intubation. Patient verbalized understanding. Offered patient 05/08/23 at [facility] hospital  at 9:15 start. Patient agreed. Informed patient I will put all instructions on MyCHart for him to review and to contact our office if he has any questions. Patient verbalized understanding. Scheduled with [facility] Case ID #[id number].

## 2023-04-24 ENCOUNTER — Encounter: Payer: Medicare HMO | Admitting: Gastroenterology

## 2023-04-26 NOTE — Telephone Encounter (Signed)
John,  Can you please contact patient and explain what surgery this was from. Thank you.

## 2023-04-26 NOTE — Telephone Encounter (Signed)
Inbound call from patient, unsure why procedure is being done at the hospital, would like more clarification.

## 2023-04-27 NOTE — Telephone Encounter (Signed)
All of the patient's questions were answered and patient still wants to proceed with procedures at the hospital.

## 2023-04-27 NOTE — Telephone Encounter (Signed)
Patient calling in regards to previous note. Please advise.   Thank you

## 2023-05-01 ENCOUNTER — Encounter (HOSPITAL_COMMUNITY): Payer: Self-pay | Admitting: Gastroenterology

## 2023-05-08 ENCOUNTER — Encounter (HOSPITAL_COMMUNITY): Payer: Self-pay | Admitting: Gastroenterology

## 2023-05-08 ENCOUNTER — Ambulatory Visit (HOSPITAL_COMMUNITY): Payer: Medicare HMO | Admitting: Certified Registered Nurse Anesthetist

## 2023-05-08 ENCOUNTER — Ambulatory Visit (HOSPITAL_COMMUNITY)
Admission: RE | Admit: 2023-05-08 | Discharge: 2023-05-08 | Disposition: A | Discharge status: Home / Self Care | Payer: Medicare HMO | Attending: Gastroenterology | Admitting: Gastroenterology

## 2023-05-08 ENCOUNTER — Ambulatory Visit (HOSPITAL_BASED_OUTPATIENT_CLINIC_OR_DEPARTMENT_OTHER): Payer: Medicare HMO | Admitting: Certified Registered Nurse Anesthetist

## 2023-05-08 ENCOUNTER — Other Ambulatory Visit: Payer: Self-pay

## 2023-05-08 ENCOUNTER — Encounter (HOSPITAL_COMMUNITY)
Admission: RE | Disposition: A | Discharge status: Home / Self Care | Payer: Self-pay | Source: Home / Self Care | Attending: Gastroenterology

## 2023-05-08 DIAGNOSIS — K5730 Diverticulosis of large intestine without perforation or abscess without bleeding: Secondary | ICD-10-CM | POA: Insufficient documentation

## 2023-05-08 DIAGNOSIS — D128 Benign neoplasm of rectum: Secondary | ICD-10-CM

## 2023-05-08 DIAGNOSIS — K6389 Other specified diseases of intestine: Secondary | ICD-10-CM

## 2023-05-08 DIAGNOSIS — K640 First degree hemorrhoids: Secondary | ICD-10-CM | POA: Diagnosis not present

## 2023-05-08 DIAGNOSIS — K2950 Unspecified chronic gastritis without bleeding: Secondary | ICD-10-CM | POA: Diagnosis not present

## 2023-05-08 DIAGNOSIS — K219 Gastro-esophageal reflux disease without esophagitis: Secondary | ICD-10-CM | POA: Insufficient documentation

## 2023-05-08 DIAGNOSIS — D509 Iron deficiency anemia, unspecified: Secondary | ICD-10-CM | POA: Insufficient documentation

## 2023-05-08 DIAGNOSIS — K621 Rectal polyp: Secondary | ICD-10-CM

## 2023-05-08 DIAGNOSIS — K635 Polyp of colon: Secondary | ICD-10-CM | POA: Diagnosis not present

## 2023-05-08 DIAGNOSIS — E669 Obesity, unspecified: Secondary | ICD-10-CM | POA: Insufficient documentation

## 2023-05-08 DIAGNOSIS — C9110 Chronic lymphocytic leukemia of B-cell type not having achieved remission: Secondary | ICD-10-CM | POA: Insufficient documentation

## 2023-05-08 DIAGNOSIS — I10 Essential (primary) hypertension: Secondary | ICD-10-CM | POA: Diagnosis not present

## 2023-05-08 DIAGNOSIS — Z8546 Personal history of malignant neoplasm of prostate: Secondary | ICD-10-CM | POA: Diagnosis not present

## 2023-05-08 DIAGNOSIS — Z6834 Body mass index (BMI) 34.0-34.9, adult: Secondary | ICD-10-CM | POA: Diagnosis not present

## 2023-05-08 DIAGNOSIS — R195 Other fecal abnormalities: Secondary | ICD-10-CM | POA: Insufficient documentation

## 2023-05-08 DIAGNOSIS — K2970 Gastritis, unspecified, without bleeding: Secondary | ICD-10-CM

## 2023-05-08 DIAGNOSIS — G4733 Obstructive sleep apnea (adult) (pediatric): Secondary | ICD-10-CM | POA: Insufficient documentation

## 2023-05-08 DIAGNOSIS — Z1211 Encounter for screening for malignant neoplasm of colon: Secondary | ICD-10-CM

## 2023-05-08 DIAGNOSIS — G4730 Sleep apnea, unspecified: Secondary | ICD-10-CM | POA: Insufficient documentation

## 2023-05-08 DIAGNOSIS — D124 Benign neoplasm of descending colon: Secondary | ICD-10-CM

## 2023-05-08 HISTORY — PX: ESOPHAGOGASTRODUODENOSCOPY (EGD) WITH PROPOFOL: SHX5813

## 2023-05-08 HISTORY — PX: BIOPSY: SHX5522

## 2023-05-08 HISTORY — PX: POLYPECTOMY: SHX5525

## 2023-05-08 HISTORY — PX: COLONOSCOPY WITH PROPOFOL: SHX5780

## 2023-05-08 SURGERY — ESOPHAGOGASTRODUODENOSCOPY (EGD) WITH PROPOFOL
Anesthesia: Monitor Anesthesia Care

## 2023-05-08 MED ORDER — PROPOFOL 500 MG/50ML IV EMUL
INTRAVENOUS | Status: AC
  Filled 2023-05-08: qty 50

## 2023-05-08 MED ORDER — PROPOFOL 1000 MG/100ML IV EMUL
INTRAVENOUS | Status: AC
  Filled 2023-05-08: qty 100

## 2023-05-08 MED ORDER — SODIUM CHLORIDE 0.9 % IV SOLN: INTRAVENOUS | Status: DC | PRN

## 2023-05-08 MED ORDER — PROPOFOL 500 MG/50ML IV EMUL
INTRAVENOUS | Status: DC | PRN
  Administered 2023-05-08: 30 mg via INTRAVENOUS
  Administered 2023-05-08: 50 mg via INTRAVENOUS
  Administered 2023-05-08 (×2): 100 mg via INTRAVENOUS
  Administered 2023-05-08: 100 ug/kg/min via INTRAVENOUS
  Administered 2023-05-08: 20 mg via INTRAVENOUS
  Administered 2023-05-08: 60 mg via INTRAVENOUS
  Administered 2023-05-08: 50 mg via INTRAVENOUS
  Administered 2023-05-08 (×2): 30 mg via INTRAVENOUS

## 2023-05-08 MED ORDER — PROPOFOL 10 MG/ML IV BOLUS: INTRAVENOUS | Status: DC | PRN

## 2023-05-08 MED ORDER — PHENYLEPHRINE 80 MCG/ML (10ML) SYRINGE FOR IV PUSH (FOR BLOOD PRESSURE SUPPORT)
PREFILLED_SYRINGE | INTRAVENOUS | Status: DC | PRN
Start: 2023-05-08 — End: 2023-05-08
  Administered 2023-05-08: 80 ug via INTRAVENOUS
  Administered 2023-05-08: 160 ug via INTRAVENOUS
  Administered 2023-05-08: 80 ug via INTRAVENOUS
  Administered 2023-05-08: 120 ug via INTRAVENOUS

## 2023-05-08 SURGICAL SUPPLY — 24 items
BLOCK BITE 60FR ADLT L/F BLUE (MISCELLANEOUS) ×2 IMPLANT
ELECT REM PT RETURN 9FT ADLT (ELECTROSURGICAL)
ELECTRODE REM PT RTRN 9FT ADLT (ELECTROSURGICAL) IMPLANT
FCP BXJMBJMB 240X2.8X (CUTTING FORCEPS)
FLOOR PAD 36X40 (MISCELLANEOUS) ×2
FORCEP RJ3 GP 1.8X160 W-NEEDLE (CUTTING FORCEPS) IMPLANT
FORCEPS BIOP RAD 4 LRG CAP 4 (CUTTING FORCEPS) IMPLANT
FORCEPS BIOP RJ4 240 W/NDL (CUTTING FORCEPS)
FORCEPS BXJMBJMB 240X2.8X (CUTTING FORCEPS) IMPLANT
INJECTOR/SNARE I SNARE (MISCELLANEOUS) IMPLANT
LUBRICANT JELLY 4.5OZ STERILE (MISCELLANEOUS) IMPLANT
MANIFOLD NEPTUNE II (INSTRUMENTS) IMPLANT
NDL SCLEROTHERAPY 25GX240 (NEEDLE) IMPLANT
NEEDLE SCLEROTHERAPY 25GX240 (NEEDLE)
PAD FLOOR 36X40 (MISCELLANEOUS) ×2 IMPLANT
PROBE APC STR FIRE (PROBE) IMPLANT
PROBE INJECTION GOLD 7FR (MISCELLANEOUS) IMPLANT
SNARE ROTATE MED OVAL 20MM (MISCELLANEOUS) IMPLANT
SNARE SHORT THROW 13M SML OVAL (MISCELLANEOUS) IMPLANT
SYR 50ML LL SCALE MARK (SYRINGE) IMPLANT
TRAP SPECIMEN MUCOUS 40CC (MISCELLANEOUS) IMPLANT
TUBING ENDO SMARTCAP PENTAX (MISCELLANEOUS) ×4 IMPLANT
TUBING IRRIGATION ENDOGATOR (MISCELLANEOUS) ×2 IMPLANT
WATER STERILE IRR 1000ML POUR (IV SOLUTION) IMPLANT

## 2023-05-08 NOTE — Transfer of Care (Signed)
Immediate Anesthesia Transfer of Care Note  Patient: Eugene [name]  Procedure(s) Performed: ESOPHAGOGASTRODUODENOSCOPY (EGD) WITH PROPOFOL COLONOSCOPY WITH PROPOFOL POLYPECTOMY BIOPSY  Patient Location: PACU and Endoscopy Unit  Anesthesia Type:MAC  Level of Consciousness: drowsy and patient cooperative  Airway & Oxygen Therapy: Patient Spontanous Breathing and Patient connected to face mask oxygen  Post-op Assessment: Report given to RN and Post -op Vital signs reviewed and stable  Post vital signs: Reviewed and stable  Last Vitals:  Vitals Value Taken Time  BP 102/62 05/08/23 1029  Temp    Pulse 72 05/08/23 1030  Resp 20 05/08/23 1030  SpO2 100 % 05/08/23 1030  Vitals shown include unfiled device data.  Last Pain:  Vitals:   05/08/23 0834  TempSrc: Temporal  PainSc: 4          Complications: No notable events documented.

## 2023-05-08 NOTE — H&P (Signed)
History & Physical  Primary Care Physician:  Ellyn Hack, MD Primary Gastroenterologist: Claudette Head, MD  Impression / Plan:  Occult blood in stool, acute on chronic anemia felt secondary to iron deficiency and chronic kidney disease and GERD for colonoscopy and EGD.  CHIEF COMPLAINT: GERD, heme positive stool, iron deficiency anemia  HPI: Eugene [name] is a 77 y.o. male with occult blood in stool, acute on chronic anemia felt secondary to iron deficiency and chronic kidney disease and GERD for colonoscopy and EGD.   Past Medical History:  Diagnosis Date   Allergic rhinitis due to pollen    Allergy    seasonal   Anxiety    Blood transfusion without reported diagnosis    do not except blood transfusions.   Cataract    beginning stage   CLL (chronic lymphocytic leukemia) (HCC)    DDD (degenerative disc disease), cervical    Severe w/ osteophytes on xray 3/16   Difficult intubation    ED (erectile dysfunction)    GERD (gastroesophageal reflux disease)    Heart murmur    History of Holter monitoring    started on march 19,2020   Hypertension    OBESITY    Osteoarthritis    Prostate cancer (HCC)    Pulmonary stenosis, valvar    TEE 07/2011: stable, LVEF 55%, small PFO   Refusal of blood transfusions as patient is Jehovah's Witness    Rheumatic fever    residual pulm stenosis, follows with ganji every 54mo   RHINITIS, CHRONIC    SLEEP APNEA, OBSTRUCTIVE dx 2000   NPSG 2000:  AHI 76/hr CPAP titrated to 11cm 2002     Past Surgical History:  Procedure Laterality Date   BACK SURGERY  2021   BACK SURGERY  01/06/2023   COLONOSCOPY     PROSTATE BIOPSY     ROTATOR CUFF REPAIR Right    2018   TEE WITHOUT CARDIOVERSION  08/02/2011   Procedure: TRANSESOPHAGEAL ECHOCARDIOGRAM (TEE);  Surgeon: Pamella Pert, MD;  Location: [facility] ENDOSCOPY;  Service: Cardiovascular;  Laterality: N/A;    Prior to Admission medications   Medication Sig Start Date End Date Taking?  Authorizing Provider  acetaminophen (TYLENOL) 650 MG CR tablet Take 650 mg by mouth 2 (two) times daily.   Yes [provider]  allopurinol (ZYLOPRIM) 100 MG tablet Take 100 mg by mouth daily. 05/17/17  Yes [provider]  amLODipine (NORVASC) 10 MG tablet Take 10 mg by mouth daily. 08/10/18  Yes [provider]  atorvastatin (LIPITOR) 10 MG tablet TAKE 1 TABLET EVERY DAY 01/21/21  Yes Yates Decamp, MD  azelastine (ASTELIN) 0.1 % nasal spray 1-2 sprays each nostril every 12 hours if needed 11/11/19  Yes Young, Clinton D, MD  brimonidine (ALPHAGAN) 0.2 % ophthalmic solution Place 1 drop into both eyes 2 (two) times daily. 01/25/22  Yes [provider]  famotidine (PEPCID) 20 MG tablet Take 1 tablet (20 mg total) by mouth at bedtime. 01/24/23  Yes Meredith Pel, NP  gabapentin (NEURONTIN) 300 MG capsule Take 300 mg by mouth 2 (two) times daily. 03/22/21  Yes [provider]  pantoprazole (PROTONIX) 40 MG tablet Take 1 tablet (40 mg total) by mouth daily. 01/12/23  Yes Leroy Sea, MD  polyethylene glycol (MIRALAX / GLYCOLAX) 17 g packet Take 17 g by mouth daily as needed for mild constipation or moderate constipation.   Yes [provider]  b complex vitamins capsule Take 1  capsule by mouth daily.    [provider]  ferrous sulfate 325 (65 FE) MG tablet Take 1 tablet (325 mg total) by mouth 2 (two) times daily with a meal. 01/11/23   Leroy Sea, MD  folic acid (FOLVITE) 1 MG tablet Take 1 tablet (1 mg total) by mouth daily. 01/12/23   Leroy Sea, MD  OVER THE COUNTER MEDICATION Take 3 tablets by mouth daily. Omega XL    [provider]  tamsulosin (FLOMAX) 0.4 MG CAPS capsule Take 0.4 mg by mouth every evening. 05/23/19   [provider]  tiZANidine (ZANAFLEX) 4 MG tablet Take 1 tablet (4 mg total) by mouth every 6 (six) hours as needed for muscle spasms. 01/07/23   Coletta Memos, MD    No current  facility-administered medications for this encounter.    Allergies as of 04/21/2023 - Review Complete 03/27/2023  Allergen Reaction Noted   Other Other (See Comments) 03/28/2014   Penicillins  04/22/2008   Latex Rash 11/26/2020    Family History  Problem Relation Age of Onset   Heart disease Mother    Hypertension Other    Melanoma Brother    Heart disease Brother    Colon cancer Neg Hx    Stomach cancer Neg Hx    Rectal cancer Neg Hx    Esophageal cancer Neg Hx    Prostate cancer Neg Hx    Pancreatic cancer Neg Hx     Social History   Socioeconomic History   Marital status: Married    Spouse name: Not on file   Number of children: 3   Years of education: Not on file   Highest education level: Not on file  Occupational History   Occupation: retired.      Comment: prev worked as Teaching laboratory technician for [employer]  Tobacco Use   Smoking status: Never   Smokeless tobacco: Never  Vaping Use   Vaping status: Never Used  Substance and Sexual Activity   Alcohol use: Yes    Comment: Only on weekends liquor (mixed drink)   Drug use: No   Sexual activity: Yes  Other Topics Concern   Not on file  Social History Narrative   Not on file   Social Drivers of Health   Financial Resource Strain: Medium Risk (06/14/2022)   Received from [facility]   Overall Financial Resource Strain (CARDIA)    Difficulty of Paying Living Expenses: Somewhat hard  Food Insecurity: No Food Insecurity (01/09/2023)   Hunger Vital Sign    Worried About Running Out of Food in the Last Year: Never true    Ran Out of Food in the Last Year: Never true  Transportation Needs: No Transportation Needs (01/09/2023)   PRAPARE - Administrator, Civil Service (Medical): No    Lack of Transportation (Non-Medical): No  Physical Activity: Not on file  Stress: Not on file  Social Connections: Unknown (01/14/2022)   Received from [facility], [facility]   Social Network     Social Network: Not on file  Intimate Partner Violence: Not At Risk (01/09/2023)   Humiliation, Afraid, Rape, and Kick questionnaire    Fear of Current or Ex-Partner: No    Emotionally Abused: No    Physically Abused: No    Sexually Abused: No    Review of Systems:  All systems reviewed were negative except where noted in HPI.   Physical Exam: Vital signs in last 24 hours: Temp:  [  97.5 F (36.4 C)-97.9 F (36.6 C)] 97.9 F (36.6 C) (12/23 1029) Pulse Rate:  [72-80] 74 (12/23 1040) Resp:  [15-25] 18 (12/23 1040) BP: (99-106)/(58-65) 99/65 (12/23 1040) SpO2:  [97 %-100 %] 97 % (12/23 1040) Weight:  [108.9 kg] 108.9 kg (12/23 0834)   General:  Alert, well-developed, in NAD Head:  Normocephalic and atraumatic. Eyes:  Sclera clear, no icterus.   Conjunctiva pink. Ears:  Normal auditory acuity. Mouth:  No deformity or lesions.  Neck:  Supple; no masses. Lungs:  Clear throughout to auscultation.   No wheezes, crackles, or rhonchi.  Heart:  Regular rate and rhythm; no murmurs. Abdomen:  Soft, nondistended, nontender. No masses, hepatomegaly. No palpable masses.  Normal bowel sounds.    Rectal:  Deferred   Msk:  Symmetrical without gross deformities. Extremities:  Without edema. Neurologic:  Alert and  oriented x 4; grossly normal neurologically. Skin:  Intact without significant lesions or rashes. Psych:  Alert and cooperative. Normal mood and affect.   Venita Lick. Russella Dar  05/08/2023, 10:44 AM See Loretha Stapler, [facility], to contact our on call provider

## 2023-05-08 NOTE — Anesthesia Postprocedure Evaluation (Signed)
Anesthesia Post Note  Patient: Eugene [name]  Procedure(s) Performed: ESOPHAGOGASTRODUODENOSCOPY (EGD) WITH PROPOFOL COLONOSCOPY WITH PROPOFOL POLYPECTOMY BIOPSY     Patient location during evaluation: Endoscopy Anesthesia Type: MAC Level of consciousness: awake Pain management: pain level controlled Vital Signs Assessment: post-procedure vital signs reviewed and stable Respiratory status: spontaneous breathing, nonlabored ventilation and respiratory function stable Cardiovascular status: blood pressure returned to baseline and stable Postop Assessment: no apparent nausea or vomiting Anesthetic complications: no   No notable events documented.  Last Vitals:  Vitals:   05/08/23 1050 05/08/23 1100  BP: (!) 102/49 121/69  Pulse: 75 74  Resp: 17 16  Temp:    SpO2: 99% 95%    Last Pain:  Vitals:   05/08/23 1100  TempSrc:   PainSc: 0-No pain                 Aashvi Rezabek P Jaleiah Asay

## 2023-05-08 NOTE — Discharge Instructions (Signed)

## 2023-05-08 NOTE — Op Note (Signed)
[facility] Patient Name: Eugene [name] Procedure Date: 05/08/2023 MRN: [id number] Attending MD: Meryl Dare , MD, [phone] Date of Birth: [birth year] CSN: [id number] Age: 77 Admit Type: Outpatient Procedure:                Colonoscopy Indications:              Heme positive stool, Iron deficiency anemia,                            Personal history of adenomatous colon polyps Providers:                Venita Lick. Russella Dar, MD, Fransisca Connors, Rogue Jury, RN, Alan Ripper, Technician Referring MD:             Shelbie Ammons. Sherryll Burger, MD Medicines:                Monitored Anesthesia Care Complications:            No immediate complications. Estimated blood loss:                            None. Estimated Blood Loss:     Estimated blood loss: none. Procedure:                Pre-Anesthesia Assessment:                           - Prior to the procedure, a History and Physical                            was performed, and patient medications and                            allergies were reviewed. The patient's tolerance of                            previous anesthesia was also reviewed. The risks                            and benefits of the procedure and the sedation                            options and risks were discussed with the patient.                            All questions were answered, and informed consent                            was obtained. Prior Anticoagulants: The patient has                            taken no anticoagulant or antiplatelet agents. ASA  Grade Assessment: III - A patient with severe                            systemic disease. After reviewing the risks and                            benefits, the patient was deemed in satisfactory                            condition to undergo the procedure.                           After obtaining informed consent, the colonoscope                             was passed under direct vision. Throughout the                            procedure, the patient's blood pressure, pulse, and                            oxygen saturations were monitored continuously. The                            [id number]-HQ190L ([id number]) Olympus colonoscope was                            introduced through the anus and advanced to the the                            cecum, identified by appendiceal orifice and                            ileocecal valve. The ileocecal valve, appendiceal                            orifice, and rectum were photographed. The quality                            of the bowel preparation was adequate. The                            colonoscopy was performed without difficulty. The                            patient tolerated the procedure well. Scope In: 9:48:17 AM Scope Out: 10:07:28 AM Scope Withdrawal Time: 0 hours 16 minutes 37 seconds  Total Procedure Duration: 0 hours 19 minutes 11 seconds  Findings:      The perianal and digital rectal examinations were normal.      A 12 mm polyp was found in the proximal rectum. The polyp was       semi-pedunculated. The polyp was removed with a hot snare. Resection and       retrieval were complete.  Three sessile polyps were found in the descending colon. The polyps were       6 to 7 mm in size. These polyps were removed with a cold snare.       Resection and retrieval were complete.      Multiple medium-mouthed and small-mouthed diverticula were found in the       left colon. There was no evidence of diverticular bleeding.      Internal hemorrhoids were found during retroflexion. The hemorrhoids       were moderate and Grade I (internal hemorrhoids that do not prolapse).      The exam was otherwise without abnormality on direct and retroflexion       views. Impression:               - One 12 mm polyp in the proximal rectum, removed                            with a hot snare. Resected and  retrieved.                           - Three 6 to 7 mm polyps in the descending colon,                            removed with a cold snare. Resected and retrieved.                           - Mild diverticulosis in the left colon.                           - Internal hemorrhoids.                           - The examination was otherwise normal on direct                            and retroflexion views. Moderate Sedation:      Not Applicable - Patient had care per Anesthesia. Recommendation:           - Repeat colonoscopy vs no repeat due to age after                            studies are complete for surveillance based on                            pathology results.                           - Patient has a contact number available for                            emergencies. The signs and symptoms of potential                            delayed complications were discussed with the  patient. Return to normal activities tomorrow.                            Written discharge instructions were provided to the                            patient.                           - Resume previous diet adding high fiber.                           - Continue present medications.                           - Await pathology results. Procedure Code(s):        --- Professional ---                           [id number], Colonoscopy, flexible; with removal of                            tumor(s), polyp(s), or other lesion(s) by snare                            technique Diagnosis Code(s):        --- Professional ---                           D12.8, Benign neoplasm of rectum                           D12.4, Benign neoplasm of descending colon                           K64.0, First degree hemorrhoids                           R19.5, Other fecal abnormalities                           D50.9, Iron deficiency anemia, unspecified                           K57.30, Diverticulosis of large  intestine without                            perforation or abscess without bleeding CPT copyright 2022 American Medical Association. All rights reserved. The codes documented in this report are preliminary and upon coder review may  be revised to meet current compliance requirements. Meryl Dare, MD 05/08/2023 10:31:45 AM This report has been signed electronically. Number of Addenda: 0

## 2023-05-08 NOTE — Anesthesia Preprocedure Evaluation (Addendum)
Anesthesia Evaluation  Patient identified by MRN, date of birth, ID band Patient awake    Reviewed: Allergy & Precautions, NPO status , Patient's Chart, lab work & pertinent test results  History of Anesthesia Complications (+) DIFFICULT AIRWAY and history of anesthetic complications  Airway Mallampati: II  TM Distance: >3 FB Neck ROM: Full    Dental no notable dental hx.    Pulmonary asthma , sleep apnea    Pulmonary exam normal        Cardiovascular hypertension, Pt. on medications Normal cardiovascular exam     Neuro/Psych   Anxiety     negative neurological ROS     GI/Hepatic Neg liver ROS,GERD  Medicated and Controlled,,  Endo/Other  negative endocrine ROS    Renal/GU negative Renal ROS     Musculoskeletal  (+) Arthritis ,    Abdominal  (+) + obese  Peds  Hematology  (+) REFUSES BLOOD PRODUCTS, JEHOVAH'S WITNESS  Anesthesia Other Findings IDA hx of colon polyps  Reproductive/Obstetrics                             Anesthesia Physical Anesthesia Plan  ASA: 3  Anesthesia Plan: MAC   Post-op Pain Management:    Induction:   PONV Risk Score and Plan: 1 and Propofol infusion and Treatment may vary due to age or medical condition  Airway Management Planned: Nasal Cannula  Additional Equipment:   Intra-op Plan:   Post-operative Plan:   Informed Consent: I have reviewed the patients History and Physical, chart, labs and discussed the procedure including the risks, benefits and alternatives for the proposed anesthesia with the patient or authorized representative who has indicated his/her understanding and acceptance.     Dental advisory given  Plan Discussed with: CRNA  Anesthesia Plan Comments:        Anesthesia Quick Evaluation

## 2023-05-08 NOTE — Anesthesia Procedure Notes (Addendum)
Procedure Name: MAC Date/Time: 05/08/2023 9:37 AM  Performed by: Maurene Capes, CRNAPre-anesthesia Checklist: Patient identified, Emergency Drugs available, Suction available, Patient being monitored and Timeout performed Patient Re-evaluated:Patient Re-evaluated prior to induction Oxygen Delivery Method: Simple face mask Preoxygenation: Pre-oxygenation with 100% oxygen Induction Type: IV induction Placement Confirmation: positive ETCO2 Dental Injury: Teeth and Oropharynx as per pre-operative assessment

## 2023-05-08 NOTE — Op Note (Addendum)
[facility] Patient Name: Eugene [name] Procedure Date: 05/08/2023 MRN: [id number] Attending MD: Meryl Dare , MD, [phone] Date of Birth: 13-Jan-1946 CSN: [id number] Age: 77 Admit Type: Outpatient Procedure:                Upper GI endoscopy Indications:              Iron deficiency anemia, Heme positive stool, GERD Providers:                Venita Lick. Russella Dar, MD, Rogue Jury, RN, Fransisca Connors, Alan Ripper, Technician Referring MD:             Shelbie Ammons Sherryll Burger, MD Medicines:                Monitored Anesthesia Care Complications:            No immediate complications. Estimated Blood Loss:     Estimated blood loss was minimal. Procedure:                Pre-Anesthesia Assessment:                           - Prior to the procedure, a History and Physical                            was performed, and patient medications and                            allergies were reviewed. The patient's tolerance of                            previous anesthesia was also reviewed. The risks                            and benefits of the procedure and the sedation                            options and risks were discussed with the patient.                            All questions were answered, and informed consent                            was obtained. Prior Anticoagulants: The patient has                            taken no anticoagulant or antiplatelet agents. ASA                            Grade Assessment: III - A patient with severe                            systemic disease. After reviewing the risks and  benefits, the patient was deemed in satisfactory                            condition to undergo the procedure.                           - Prior to the procedure, a History and Physical                            was performed, and patient medications and                            allergies were reviewed. The  patient's tolerance of                            previous anesthesia was also reviewed. The risks                            and benefits of the procedure and the sedation                            options and risks were discussed with the patient.                            All questions were answered, and informed consent                            was obtained. Prior Anticoagulants: The patient has                            taken no anticoagulant or antiplatelet agents. ASA                            Grade Assessment: III - A patient with severe                            systemic disease. After reviewing the risks and                            benefits, the patient was deemed in satisfactory                            condition to undergo the procedure.                           After obtaining informed consent, the endoscope was                            passed under direct vision. Throughout the                            procedure, the patient's blood pressure, pulse, and  oxygen saturations were monitored continuously. The                            [id number]-[id number] ([id number] Olympus endoscope was introduced                            through the mouth, and advanced to the second part                            of duodenum. The upper GI endoscopy was                            accomplished without difficulty. The patient                            tolerated the procedure well. Scope In: Scope Out: Findings:      The examined esophagus was normal.      Diffuse moderate inflammation characterized by congestion (edema) and       granularity was found in the entire examined stomach. Biopsies were       taken with a cold forceps for histology.      The exam of the stomach was otherwise normal.      The duodenal bulb and second portion of the duodenum were normal.       Biopsies for histology were taken with a cold forceps for evaluation of       celiac  disease. Impression:               - Normal esophagus.                           - Gastritis. Biopsied.                           - Normal duodenal bulb and second portion of the                            duodenum. Biopsied. Moderate Sedation:      Not Applicable - Patient had care per Anesthesia. Recommendation:           - Patient has a contact number available for                            emergencies. The signs and symptoms of potential                            delayed complications were discussed with the                            patient. Return to normal activities tomorrow.                            Written discharge instructions were provided to the                            patient.                           -  Resume previous diet.                           - Continue present medications.                           - Await pathology results. Procedure Code(s):        --- Professional ---                           [phone], Esophagogastroduodenoscopy, flexible,                            transoral; with biopsy, single or multiple Diagnosis Code(s):        --- Professional ---                           K29.70, Gastritis, unspecified, without bleeding                           D50.9, Iron deficiency anemia, unspecified                           R19.5, Other fecal abnormalities CPT copyright 2022 American Medical Association. All rights reserved. The codes documented in this report are preliminary and upon coder review may  be revised to meet current compliance requirements. Meryl Dare, MD 05/08/2023 10:49:32 AM This report has been signed electronically. Number of Addenda: 0

## 2023-05-10 ENCOUNTER — Encounter (HOSPITAL_COMMUNITY): Payer: Self-pay | Admitting: Gastroenterology

## 2023-05-11 ENCOUNTER — Encounter: Payer: Self-pay | Admitting: Gastroenterology

## 2023-05-11 LAB — SURGICAL PATHOLOGY

## 2023-07-07 ENCOUNTER — Other Ambulatory Visit: Payer: Self-pay

## 2023-07-07 ENCOUNTER — Encounter: Payer: Self-pay | Admitting: Neurology

## 2023-07-07 DIAGNOSIS — R202 Paresthesia of skin: Secondary | ICD-10-CM

## 2023-07-14 ENCOUNTER — Ambulatory Visit: Payer: Medicare HMO | Admitting: Neurology

## 2023-07-14 DIAGNOSIS — R202 Paresthesia of skin: Secondary | ICD-10-CM | POA: Diagnosis not present

## 2023-07-14 NOTE — Procedures (Signed)
 [facility]  [address], [address]  [place], [place] [address] Tel: [phone] Fax: [phone] Test Date:  07/14/2023  Patient: Eugene [name] DOB: May 17, 1945 Physician: Nita Sickle, DO  Sex: Male Height: 5\' 10"  Ref Phys: Marcie Mowers, MD  ID#: [id number]   Technician:    History: This is a 78 year old man referred for evaluation of bilateral hand numbness and tingling.  NCV & EMG Findings: Extensive electrodiagnostic testing of the right upper extremity and additional studies of the left shows: Bilateral median, ulnar, and mixed palmar sensory responses are within normal limits. Bilateral median and ulnar motor responses are within normal limits. There is no evidence of active or chronic motor axonal loss changes affecting any of the tested muscles.  Motor unit configuration and recruitment pattern is within normal limits.  Impression: This is a normal study of the upper extremities.  In particular, there is no evidence of carpal tunnel syndrome, ulnar neuropathy, or a cervical radiculopathy.    ___________________________ Nita Sickle, DO    Nerve Conduction Studies   Stim Site NR Peak (ms) Norm Peak (ms) O-P Amp (V) Norm O-P Amp  Left Median Anti Sensory (2nd Digit)  32 C  Wrist    3.4 <3.8 22.0 >10  Right Median Anti Sensory (2nd Digit)  32 C  Wrist    3.6 <3.8 16.2 >10  Left Ulnar Anti Sensory (5th Digit)  32 C  Wrist    3.2 <3.2 10.5 >5  Right Ulnar Anti Sensory (5th Digit)  32 C  Wrist    3.2 <3.2 11.1 >5     Stim Site NR Onset (ms) Norm Onset (ms) O-P Amp (mV) Norm O-P Amp Site1 Site2 Delta-0 (ms) Dist (cm) Vel (m/s) Norm Vel (m/s)  Left Median Motor (Abd Poll Brev)  32 C  Wrist    3.2 <4.0 9.1 >5 Elbow Wrist 6.1 33.0 54 >50  Elbow    9.3  8.2         Right Median Motor (Abd Poll Brev)  32 C  Wrist    3.9 <4.0 9.7 >5 Elbow Wrist 5.7 32.0 56 >50  Elbow    9.6  8.6         Left Ulnar Motor (Abd Dig Minimi)  32 C  Wrist    2.5  <3.1 7.9 >7 B Elbow Wrist 4.5 24.0 53 >50  B Elbow    7.0  7.0  A Elbow B Elbow 1.8 10.0 56 >50  A Elbow    8.8  7.0         Right Ulnar Motor (Abd Dig Minimi)  32 C  Wrist    2.3 <3.1 7.8 >7 B Elbow Wrist 4.7 25.0 53 >50  B Elbow    7.0  7.5  A Elbow B Elbow 1.8 10.0 56 >50  A Elbow    8.8  7.3            Stim Site NR Peak (ms) Norm Peak (ms) P-T Amp (V) Site1 Site2 Delta-P (ms) Norm Delta (ms)  Left Median/Ulnar Palm Comparison (Wrist - 8cm)  32 C  Median Palm    1.7 <2.2 35.8 Median Palm Ulnar Palm 0.1   Ulnar Palm    1.8 <2.2 7.4      Right Median/Ulnar Palm Comparison (Wrist - 8cm)  32 C  Median Palm    1.9 <2.2 21.3 Median Palm Ulnar Palm 0.2   Ulnar Palm    1.7 <2.2 5.5  Electromyography   Side Muscle Ins.Act Fibs Fasc Recrt Amp Dur Poly Activation Comment  Right 1stDorInt Nml Nml Nml Nml Nml Nml Nml Nml N/A  Right PronatorTeres Nml Nml Nml Nml Nml Nml Nml Nml N/A  Right Biceps Nml Nml Nml Nml Nml Nml Nml Nml N/A  Right Triceps Nml Nml Nml Nml Nml Nml Nml Nml N/A  Right Deltoid Nml Nml Nml Nml Nml Nml Nml Nml N/A  Left 1stDorInt Nml Nml Nml Nml Nml Nml Nml Nml N/A  Left PronatorTeres Nml Nml Nml Nml Nml Nml Nml Nml N/A  Left Biceps Nml Nml Nml Nml Nml Nml Nml Nml N/A  Left Triceps Nml Nml Nml Nml Nml Nml Nml Nml N/A  Left Deltoid Nml Nml Nml Nml Nml Nml Nml Nml N/A      Waveforms:

## 2023-07-27 ENCOUNTER — Encounter: Payer: Medicare HMO | Admitting: Neurology

## 2023-12-01 ENCOUNTER — Other Ambulatory Visit: Payer: Self-pay | Admitting: Family Medicine

## 2023-12-01 ENCOUNTER — Encounter: Payer: Self-pay | Admitting: Family Medicine

## 2023-12-01 DIAGNOSIS — R748 Abnormal levels of other serum enzymes: Secondary | ICD-10-CM

## 2023-12-06 ENCOUNTER — Ambulatory Visit
Admission: RE | Admit: 2023-12-06 | Discharge: 2023-12-06 | Disposition: A | Discharge status: Home / Self Care | Source: Ambulatory Visit | Attending: Family Medicine | Admitting: Family Medicine

## 2023-12-06 ENCOUNTER — Other Ambulatory Visit: Payer: Self-pay | Admitting: Family Medicine

## 2023-12-06 DIAGNOSIS — R748 Abnormal levels of other serum enzymes: Secondary | ICD-10-CM

## 2023-12-06 DIAGNOSIS — M898X1 Other specified disorders of bone, shoulder: Secondary | ICD-10-CM

## 2024-03-01 ENCOUNTER — Other Ambulatory Visit (HOSPITAL_BASED_OUTPATIENT_CLINIC_OR_DEPARTMENT_OTHER): Payer: Self-pay

## 2024-03-01 MED ORDER — COMIRNATY 30 MCG/0.3ML IM SUSY
0.3000 mL | PREFILLED_SYRINGE | Freq: Once | INTRAMUSCULAR | 0 refills | Status: AC
  Filled 2024-03-01: qty 0.3, 1d supply, fill #0

## 2024-03-01 MED ORDER — FLUZONE HIGH-DOSE 0.5 ML IM SUSY
0.5000 mL | PREFILLED_SYRINGE | Freq: Once | INTRAMUSCULAR | 0 refills | Status: AC
  Filled 2024-03-01: qty 0.5, 1d supply, fill #0
